# Patient Record
Sex: Female | Born: 1965
Health system: Southern US, Community
[De-identification: ages and names within clinical notes are randomized; demographics above are authoritative.]

## PROBLEM LIST (undated history)

## (undated) DIAGNOSIS — M795 Residual foreign body in soft tissue: Secondary | ICD-10-CM

## (undated) DIAGNOSIS — R42 Dizziness and giddiness: Secondary | ICD-10-CM

## (undated) DIAGNOSIS — Z87891 Personal history of nicotine dependence: Secondary | ICD-10-CM

## (undated) DIAGNOSIS — Z90710 Acquired absence of both cervix and uterus: Secondary | ICD-10-CM

## (undated) DIAGNOSIS — F1911 Other psychoactive substance abuse, in remission: Secondary | ICD-10-CM

## (undated) DIAGNOSIS — M1990 Unspecified osteoarthritis, unspecified site: Secondary | ICD-10-CM

## (undated) DIAGNOSIS — G459 Transient cerebral ischemic attack, unspecified: Secondary | ICD-10-CM

## (undated) DIAGNOSIS — I639 Cerebral infarction, unspecified: Secondary | ICD-10-CM

## (undated) DIAGNOSIS — T7840XA Allergy, unspecified, initial encounter: Secondary | ICD-10-CM

## (undated) DIAGNOSIS — G4730 Sleep apnea, unspecified: Secondary | ICD-10-CM

## (undated) DIAGNOSIS — Z8781 Personal history of (healed) traumatic fracture: Secondary | ICD-10-CM

## (undated) DIAGNOSIS — E119 Type 2 diabetes mellitus without complications: Secondary | ICD-10-CM

## (undated) DIAGNOSIS — Z8619 Personal history of other infectious and parasitic diseases: Secondary | ICD-10-CM

## (undated) DIAGNOSIS — F1920 Other psychoactive substance dependence, uncomplicated: Secondary | ICD-10-CM

## (undated) HISTORY — DX: Other psychoactive substance dependence, uncomplicated: F19.20

## (undated) HISTORY — DX: Personal history of other infectious and parasitic diseases: Z86.19

## (undated) HISTORY — DX: Personal history of nicotine dependence: Z87.891

## (undated) HISTORY — PX: WRIST FRACTURE SURGERY: SHX121

## (undated) HISTORY — DX: Personal history of (healed) traumatic fracture: Z87.81

## (undated) HISTORY — DX: Dizziness and giddiness: R42

## (undated) HISTORY — PX: OTHER SURGICAL HISTORY: SHX169

## (undated) HISTORY — DX: Type 2 diabetes mellitus without complications: E11.9

## (undated) HISTORY — DX: Acquired absence of both cervix and uterus: Z90.710

## (undated) HISTORY — PX: WISDOM TOOTH EXTRACTION: SHX21

## (undated) HISTORY — DX: Other psychoactive substance abuse, in remission: F19.11

## (undated) HISTORY — DX: Unspecified osteoarthritis, unspecified site: M19.90

## (undated) HISTORY — DX: Allergy, unspecified, initial encounter: T78.40XA

## (undated) HISTORY — PX: ABDOMINAL HYSTERECTOMY: SHX81

---

## 1998-04-29 ENCOUNTER — Encounter: Admission: RE | Admit: 1998-04-29 | Discharge: 1998-04-29 | Payer: Self-pay | Admitting: *Deleted

## 2000-01-24 ENCOUNTER — Emergency Department (HOSPITAL_COMMUNITY): Admission: EM | Admit: 2000-01-24 | Discharge: 2000-01-24 | Payer: Self-pay | Admitting: Emergency Medicine

## 2002-12-16 ENCOUNTER — Emergency Department (HOSPITAL_COMMUNITY): Admission: EM | Admit: 2002-12-16 | Discharge: 2002-12-16 | Payer: Self-pay | Admitting: Emergency Medicine

## 2002-12-16 ENCOUNTER — Encounter: Payer: Self-pay | Admitting: Emergency Medicine

## 2004-10-27 ENCOUNTER — Emergency Department (HOSPITAL_COMMUNITY): Admission: EM | Admit: 2004-10-27 | Discharge: 2004-10-27 | Payer: Self-pay | Admitting: Emergency Medicine

## 2005-07-05 ENCOUNTER — Inpatient Hospital Stay (HOSPITAL_COMMUNITY): Admission: AD | Admit: 2005-07-05 | Discharge: 2005-07-06 | Payer: Self-pay | Admitting: Obstetrics and Gynecology

## 2005-10-29 ENCOUNTER — Inpatient Hospital Stay (HOSPITAL_COMMUNITY): Admission: AD | Admit: 2005-10-29 | Discharge: 2005-10-29 | Payer: Self-pay | Admitting: Family Medicine

## 2005-11-15 ENCOUNTER — Ambulatory Visit (HOSPITAL_COMMUNITY): Admission: RE | Admit: 2005-11-15 | Discharge: 2005-11-15 | Payer: Self-pay | Admitting: Obstetrics

## 2005-11-15 ENCOUNTER — Encounter (INDEPENDENT_AMBULATORY_CARE_PROVIDER_SITE_OTHER): Payer: Self-pay | Admitting: Specialist

## 2006-04-07 ENCOUNTER — Inpatient Hospital Stay (HOSPITAL_COMMUNITY): Admission: AD | Admit: 2006-04-07 | Discharge: 2006-04-07 | Payer: Self-pay | Admitting: Obstetrics

## 2006-12-17 LAB — CONVERTED CEMR LAB: Pap Smear: NORMAL

## 2007-10-17 DIAGNOSIS — Z87891 Personal history of nicotine dependence: Secondary | ICD-10-CM

## 2007-10-17 HISTORY — DX: Personal history of nicotine dependence: Z87.891

## 2007-12-05 ENCOUNTER — Emergency Department (HOSPITAL_COMMUNITY): Admission: EM | Admit: 2007-12-05 | Discharge: 2007-12-05 | Payer: Self-pay | Admitting: Emergency Medicine

## 2007-12-10 ENCOUNTER — Telehealth (INDEPENDENT_AMBULATORY_CARE_PROVIDER_SITE_OTHER): Payer: Self-pay | Admitting: Family Medicine

## 2007-12-10 ENCOUNTER — Ambulatory Visit: Payer: Self-pay | Admitting: Family Medicine

## 2007-12-10 DIAGNOSIS — R42 Dizziness and giddiness: Secondary | ICD-10-CM | POA: Insufficient documentation

## 2007-12-10 DIAGNOSIS — F1910 Other psychoactive substance abuse, uncomplicated: Secondary | ICD-10-CM | POA: Insufficient documentation

## 2007-12-10 DIAGNOSIS — F17200 Nicotine dependence, unspecified, uncomplicated: Secondary | ICD-10-CM | POA: Insufficient documentation

## 2007-12-10 DIAGNOSIS — J309 Allergic rhinitis, unspecified: Secondary | ICD-10-CM | POA: Insufficient documentation

## 2008-12-22 ENCOUNTER — Emergency Department (HOSPITAL_COMMUNITY): Admission: EM | Admit: 2008-12-22 | Discharge: 2008-12-22 | Payer: Self-pay | Admitting: Emergency Medicine

## 2010-11-18 ENCOUNTER — Emergency Department (HOSPITAL_COMMUNITY)
Admission: EM | Admit: 2010-11-18 | Discharge: 2010-11-18 | Disposition: A | Discharge status: Home / Self Care | Payer: 59 | Attending: Emergency Medicine | Admitting: Emergency Medicine

## 2010-11-18 DIAGNOSIS — N390 Urinary tract infection, site not specified: Secondary | ICD-10-CM | POA: Insufficient documentation

## 2010-11-18 DIAGNOSIS — R51 Headache: Secondary | ICD-10-CM | POA: Insufficient documentation

## 2010-11-18 DIAGNOSIS — R110 Nausea: Secondary | ICD-10-CM | POA: Insufficient documentation

## 2010-11-18 DIAGNOSIS — R5383 Other fatigue: Secondary | ICD-10-CM | POA: Insufficient documentation

## 2010-11-18 DIAGNOSIS — R42 Dizziness and giddiness: Secondary | ICD-10-CM | POA: Insufficient documentation

## 2010-11-18 DIAGNOSIS — R5381 Other malaise: Secondary | ICD-10-CM | POA: Insufficient documentation

## 2010-11-18 LAB — POCT I-STAT, CHEM 8
BUN: 12 mg/dL (ref 6–23)
Calcium, Ion: 1.16 mmol/L (ref 1.12–1.32)
Chloride: 104 mEq/L (ref 96–112)
Glucose, Bld: 108 mg/dL — ABNORMAL HIGH (ref 70–99)
HCT: 38 % (ref 36.0–46.0)
Hemoglobin: 12.9 g/dL (ref 12.0–15.0)
Potassium: 3.7 mEq/L (ref 3.5–5.1)
Sodium: 140 mEq/L (ref 135–145)
TCO2: 26 mmol/L (ref 0–100)

## 2010-11-18 LAB — DIFFERENTIAL
Basophils Absolute: 0.1 10*3/uL (ref 0.0–0.1)
Lymphs Abs: 5.3 10*3/uL — ABNORMAL HIGH (ref 0.7–4.0)
Monocytes Absolute: 0.8 10*3/uL (ref 0.1–1.0)

## 2010-11-18 LAB — POCT PREGNANCY, URINE: Preg Test, Ur: NEGATIVE

## 2010-11-18 LAB — URINALYSIS, ROUTINE W REFLEX MICROSCOPIC
Bilirubin Urine: NEGATIVE
Ketones, ur: NEGATIVE mg/dL
Leukocytes, UA: NEGATIVE
Protein, ur: NEGATIVE mg/dL
Urine Glucose, Fasting: NEGATIVE mg/dL

## 2010-11-18 LAB — CBC
MCHC: 34 g/dL (ref 30.0–36.0)
RDW: 14.8 % (ref 11.5–15.5)

## 2010-11-20 LAB — URINE CULTURE
Colony Count: NO GROWTH
Culture  Setup Time: 201202040513
Culture: NO GROWTH

## 2010-11-23 ENCOUNTER — Ambulatory Visit: Payer: Self-pay | Admitting: Internal Medicine

## 2011-02-05 ENCOUNTER — Emergency Department (HOSPITAL_COMMUNITY)
Admission: EM | Admit: 2011-02-05 | Discharge: 2011-02-05 | Disposition: A | Discharge status: Home / Self Care | Payer: 59 | Attending: Emergency Medicine | Admitting: Emergency Medicine

## 2011-02-05 DIAGNOSIS — H11419 Vascular abnormalities of conjunctiva, unspecified eye: Secondary | ICD-10-CM | POA: Insufficient documentation

## 2011-02-05 DIAGNOSIS — H109 Unspecified conjunctivitis: Secondary | ICD-10-CM | POA: Insufficient documentation

## 2011-02-05 DIAGNOSIS — R599 Enlarged lymph nodes, unspecified: Secondary | ICD-10-CM | POA: Insufficient documentation

## 2011-02-05 DIAGNOSIS — H9209 Otalgia, unspecified ear: Secondary | ICD-10-CM | POA: Insufficient documentation

## 2011-02-05 DIAGNOSIS — J069 Acute upper respiratory infection, unspecified: Secondary | ICD-10-CM | POA: Insufficient documentation

## 2011-02-05 DIAGNOSIS — J329 Chronic sinusitis, unspecified: Secondary | ICD-10-CM | POA: Insufficient documentation

## 2011-02-05 DIAGNOSIS — R51 Headache: Secondary | ICD-10-CM | POA: Insufficient documentation

## 2011-02-05 DIAGNOSIS — H6690 Otitis media, unspecified, unspecified ear: Secondary | ICD-10-CM | POA: Insufficient documentation

## 2011-02-05 DIAGNOSIS — IMO0002 Reserved for concepts with insufficient information to code with codable children: Secondary | ICD-10-CM | POA: Insufficient documentation

## 2011-02-05 DIAGNOSIS — H5789 Other specified disorders of eye and adnexa: Secondary | ICD-10-CM | POA: Insufficient documentation

## 2011-02-05 DIAGNOSIS — J3489 Other specified disorders of nose and nasal sinuses: Secondary | ICD-10-CM | POA: Insufficient documentation

## 2011-02-05 LAB — POCT PREGNANCY, URINE: Preg Test, Ur: NEGATIVE

## 2011-03-03 NOTE — Op Note (Signed)
NAME:  Monique Nelson, Monique Nelson NO.:  [id number]   MEDICAL RECORD NO.:  [id number]          PATIENT TYPE:  AMB   LOCATION:  SDC                           FACILITY:  [facility]   PHYSICIAN:  Kathreen Cosier, M.D.DATE OF BIRTH:  29-Aug-1966   DATE OF PROCEDURE:  11/15/2005  DATE OF DISCHARGE:                                 OPERATIVE REPORT   PREOPERATIVE DIAGNOSIS:  Dysfunctional uterine bleeding.   PROCEDURE:  Hysteroscopy, dilatation and hysteroscopy, NovaSure ablation.   Under general anesthesia, the uterus was normal size, negative adnexa.  Endocervix was curetted and a small amount of tissue obtained.  Endometrial  cavity sounded 10 cm.  The cervical length was 5 cm, cavity length 5 cm.  The cavity was anterior, the cervix dilated to a #27 Pratt, hysteroscope  inserted, normal cavity seen.  A sharp curettage was then performed, a large  amount of tissue obtained.  The NovaSure device was inserted and the cavity  integrity passed.  Ablation was performed at 105 watts for 1 minute 41  seconds.  Cavity width was 3.8 cm.  Flow deficit 75 mL.  Repeat hysteroscopy  showed total ablation of the endometrial cavity.  The patient tolerated the  procedure well, taken to the recovery room in good condition.           ______________________________  Kathreen Cosier, M.D.     BAM/MEDQ  D:  11/15/2005  T:  11/15/2005  Job:  [id number]

## 2011-07-07 ENCOUNTER — Ambulatory Visit (INDEPENDENT_AMBULATORY_CARE_PROVIDER_SITE_OTHER): Payer: 59 | Admitting: Internal Medicine

## 2011-07-07 ENCOUNTER — Encounter: Payer: Self-pay | Admitting: Internal Medicine

## 2011-07-07 DIAGNOSIS — Z87891 Personal history of nicotine dependence: Secondary | ICD-10-CM

## 2011-07-07 DIAGNOSIS — F1911 Other psychoactive substance abuse, in remission: Secondary | ICD-10-CM

## 2011-07-07 NOTE — Patient Instructions (Signed)
Get [place] a copy of labs  . Consider weight watchers   Addition to you other  Efforts.  Need yearly thyroid tests. 3500 calories is the energy content of a pound of body weight .Must have a 3500 cal deficit to lose one pound . Thus decrease 500 calorie equivalent per day in food or drink intake / or exercise  for 7 days to lose one pound.

## 2011-07-07 NOTE — Progress Notes (Signed)
Subjective:    Patient ID: Monique Nelson) Monique Nelson, female    DOB: 1966-09-05, 45 y.o.   MRN: [id number]  HPI Tia  Comes in today   Establish. Has no current PCP . Sees [facility] office   Yearly otherwise  Concerns about having a difficult time;a hard time losing weight.  Has always been some heavy but not a string family hx of such .   BMi screening at work  With  screen lipids and bg. .  In June.   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  NOT doing organized program has done many in the past but no weight watchers.  Review of Systems Snore   No osa sx   Allergies seasonal  Uses otc.  No sig sweet drinks .  ROS:  GEN/ HEENTNo fever, signific sweats headaches vision problems hearing changes, CV/ PULM; No chest pain shortness of breath cough, syncope,edema  change in exercise tolerance. GI /GU: No adominal pain, vomiting, change in bowel habits. No blood in the stool. No significant GU symptoms. SKIN/HEME: ,no acute skin rashes suspicious lesions or bleeding. No lymphadenopathy, nodules, masses.  NEURO/ PSYCH:  No neurologic signs such as weakness numbness No depression anxiety. IMM/ Allergy: No unusual infections.  Allergy . No   REST of 12 system review negative as per hpi   Past Medical History  Diagnosis Date  . Vertigo     intermittent infrequent  . Drug addiction     recovering  . Hx of varicella   . Substance abuse in remission 13 years ago     cocaine heroiin  met   support via ADP  . Ex-smoker 2009  . History of fracture of wrist 17 years ago    has plate and pins right    Past Surgical History  Procedure Date  . Rt hand surgery     17 years ago  . Ablastion     reports that she has quit smoking. She does not have any smokeless tobacco history on file. She reports that she drinks alcohol. She reports that she uses illicit drugs ("Crack" cocaine, Heroin, Methamphetamines, and Marijuana). family history includes  Alcohol abuse in an unspecified family member; COPD in her mother; Chronic fatigue in her mother; Diabetes in her mother; Glaucoma in her father; Hypertension in her mother; Osteoarthritis in her mother; Other in her father; Sleep apnea in her mother; and Thyroid disease in her mother. No Known Allergies      Objective:   Physical Exam Physical Exam: Vital signs reviewed WUJ:WJXB is a well-developed well-nourished alert cooperative  AA female who appears her stated age in no acute distress. She is well appearing HEENT: normocephalic  traumatic , Eyes: PERRL EOM's full, conjunctiva clear, Nares: paten,t no deformity discharge or tenderness., Ears: no deformity EAC's clear TMs with normal landmarks. Mouth: clear OP, no lesions, edema.  Moist mucous membranes. Dentition in adequate repair. NECK: supple without masses, thyromegaly or bruits. CHEST/PULM:  Clear to auscultation breath sounds equal no wheeze , rales or rhonchi.. CV: PMI is nondisplaced, S1 S2 no gallops, murmurs, rubs. Peripheral pulses are full without delay.No JVD .  ABDOMEN: Bowel sounds normal nontender  No guard or rebound, no hepato splenomegal .  Extremtities:  No clubbing cyanosis or edema, no acute joint swelling or redness  NEURO:  Oriented x3, cranial nerves 3-12 appear to be intact, no obvious focal weakness,gait within normal limits  SKIN: No acute rashes normal turgor, color, no observed  bruising or petechiae. PSYCH: Oriented, good eye contact, no obvious depression anxiety, cognition and judgment appear normal.   Reviewed hx sheet  Data input per poivider    Assessment & Plan:   OBESITY  Elevated BMI Agree with taking measures to correct this is already on to a good start . Counseled. At length about  Caloric reduction and pitfalls s  .    Rec some type on life style change support  Such as weight watchers . Meds on the market not very good for  loing term success.   Get [place] copy of her labs also.  Hx of  substance abuse in the past  and   tobacco  In remission. congratulated on  Substance free    Total visit   30 mins > 50% spent counseling and coordinating care

## 2011-07-09 ENCOUNTER — Encounter: Payer: Self-pay | Admitting: Internal Medicine

## 2011-07-09 DIAGNOSIS — Z87891 Personal history of nicotine dependence: Secondary | ICD-10-CM | POA: Insufficient documentation

## 2011-07-09 DIAGNOSIS — F1911 Other psychoactive substance abuse, in remission: Secondary | ICD-10-CM | POA: Insufficient documentation

## 2011-07-10 LAB — URINALYSIS, ROUTINE W REFLEX MICROSCOPIC
Bilirubin Urine: NEGATIVE
Hgb urine dipstick: NEGATIVE
Ketones, ur: NEGATIVE
Nitrite: NEGATIVE
Protein, ur: NEGATIVE
Specific Gravity, Urine: <1.005 — ABNORMAL LOW
Urobilinogen, UA: 0.2

## 2011-07-10 LAB — CBC
MCHC: 34.4
MCV: 86.1
Platelets: 400
RDW: 15.1
WBC: 9.7

## 2011-07-10 LAB — RAPID URINE DRUG SCREEN, HOSP PERFORMED
Amphetamines: NOT DETECTED
Tetrahydrocannabinol: NOT DETECTED

## 2011-07-10 LAB — BASIC METABOLIC PANEL
BUN: 15
CO2: 27
Calcium: 9
Chloride: 104
Creatinine, Ser: 0.75
Glucose, Bld: 109 — ABNORMAL HIGH

## 2011-07-10 LAB — PREGNANCY, URINE: Preg Test, Ur: NEGATIVE

## 2011-10-31 ENCOUNTER — Encounter: Payer: Self-pay | Admitting: Family

## 2011-10-31 ENCOUNTER — Ambulatory Visit (INDEPENDENT_AMBULATORY_CARE_PROVIDER_SITE_OTHER): Payer: 59 | Admitting: Family

## 2011-10-31 VITALS — BP 118/62 | Temp 98.3°F | Wt 242.0 lb

## 2011-10-31 DIAGNOSIS — R42 Dizziness and giddiness: Secondary | ICD-10-CM

## 2011-10-31 MED ORDER — MECLIZINE HCL 25 MG PO TABS: 25.0000 mg | ORAL_TABLET | Freq: Three times a day (TID) | ORAL | Status: DC | PRN

## 2011-10-31 NOTE — Patient Instructions (Signed)
Vertigo Vertigo means you feel like you or your surroundings are moving when they are not. Vertigo can be dangerous if it occurs when you are at work, driving, or performing difficult activities.  CAUSES  Vertigo occurs when there is a conflict of signals sent to your brain from the visual and sensory systems in your body. There are many different causes of vertigo, including:  Infections, especially in the inner ear.   A bad reaction to a drug or misuse of alcohol and medicines.   Withdrawal from drugs or alcohol.   Rapidly changing positions, such as lying down or rolling over in bed.   A migraine headache.   Decreased blood flow to the brain.   Increased pressure in the brain from a head injury, infection, tumor, or bleeding.  SYMPTOMS  You may feel as though the world is spinning around or you are falling to the ground. Because your balance is upset, vertigo can cause nausea and vomiting. You may have involuntary eye movements (nystagmus). DIAGNOSIS  Vertigo is usually diagnosed by physical exam. If the cause of your vertigo is unknown, your caregiver may perform imaging tests, such as an MRI scan (magnetic resonance imaging). TREATMENT  Most cases of vertigo resolve on their own, without treatment. Depending on the cause, your caregiver may prescribe certain medicines. If your vertigo is related to body position issues, your caregiver may recommend movements or procedures to correct the problem. In rare cases, if your vertigo is caused by certain inner ear problems, you may need surgery. HOME CARE INSTRUCTIONS   Follow your caregiver's instructions.   Avoid driving.   Avoid operating heavy machinery.   Avoid performing any tasks that would be dangerous to you or others during a vertigo episode.   Tell your caregiver if you notice that certain medicines seem to be causing your vertigo. Some of the medicines used to treat vertigo episodes can actually make them worse in some  people.  SEEK IMMEDIATE MEDICAL CARE IF:   Your medicines do not relieve your vertigo or are making it worse.   You develop problems with talking, walking, weakness, or using your arms, hands, or legs.   You develop severe headaches.   Your nausea or vomiting continues or gets worse.   You develop visual changes.   A family member notices behavioral changes.   Your condition gets worse.  MAKE SURE YOU:  Understand these instructions.   Will watch your condition.   Will get help right away if you are not doing well or get worse.  Document Released: 07/12/2005 Document Revised: 06/14/2011 Document Reviewed: 04/20/2011 [facility] Patient Information 2012 [place], [place].  Exercise to Lose Weight Exercise and a healthy diet may help you lose weight. Your doctor may suggest specific exercises. EXERCISE IDEAS AND TIPS  Choose low-cost things you enjoy doing, such as walking, bicycling, or exercising to workout videos.   Take stairs instead of the elevator.   Walk during your lunch break.   Park your car further away from work or school.   Go to a gym or an exercise class.   Start with 5 to 10 minutes of exercise each day. Build up to 30 minutes of exercise 4 to 6 days a week.   Wear shoes with good support and comfortable clothes.   Stretch before and after working out.   Work out until you breathe harder and your heart beats faster.   Drink extra water when you exercise.   Do not do  so much that you hurt yourself, feel dizzy, or get very short of breath.  Exercises that burn about 150 calories:  Running 1  miles in 15 minutes.   Playing volleyball for 45 to 60 minutes.   Washing and waxing a car for 45 to 60 minutes.   Playing touch football for 45 minutes.   Walking 1  miles in 35 minutes.   Pushing a stroller 1  miles in 30 minutes.   Playing basketball for 30 minutes.   Raking leaves for 30 minutes.   Bicycling 5 miles in 30 minutes.   Walking 2  miles in 30 minutes.   Dancing for 30 minutes.   Shoveling snow for 15 minutes.   Swimming laps for 20 minutes.   Walking up stairs for 15 minutes.   Bicycling 4 miles in 15 minutes.   Gardening for 30 to 45 minutes.   Jumping rope for 15 minutes.   Washing windows or floors for 45 to 60 minutes.  Document Released: 11/04/2010 Document Revised: 06/14/2011 Document Reviewed: 11/04/2010 [facility] Patient Information 2012 [place], [place].

## 2011-10-31 NOTE — Progress Notes (Signed)
Subjective:    Patient ID: Daniel Nones Crist Infante) Monique Nelson, female    DOB: 1966/04/06, 46 y.o.   MRN: [id number]  HPI 46 year old [place] American female, nonsmoker, patient of Dr. Fabian Sharp is in today with complaints of vertigo. She has a history of vertigo. She feels like the room is spinning, it is accompanied by nausea. She's had no vomiting. Denies any upper respiratory symptoms, no sneezing, cough, congestion. She denies any chest pain, shortness of breath, or edema.  Patient also has concerns of being morbidly obese. She currently exercises 4 times a week including cardiovascular exercise as well as weight training. She has tried numerous diets to include Weight Watchers. She has been unable to lose weight. She is inquiring about Adipex. Denies any history of any cardiovascular illnesses. No family history of any sudden death or cardiovascular disease.   Review of Systems  Constitutional: Negative.   HENT: Negative.   Eyes: Negative.   Respiratory: Negative.   Cardiovascular: Negative.   Gastrointestinal: Negative.   Genitourinary: Negative.   Musculoskeletal: Negative.   Skin: Negative.   Neurological: Positive for dizziness.  Hematological: Negative.   Psychiatric/Behavioral: Negative.    Past Medical History  Diagnosis Date  . Vertigo     intermittent infrequent  . Drug addiction     recovering  . Hx of varicella   . Substance abuse in remission 13 years ago     cocaine heroiin  met   support via ADP  . Ex-smoker 2009  . History of fracture of wrist 17 years ago    has plate and pins right     History   Social History  . Marital Status: Single    Spouse Name: N/A    Number of Children: N/A  . Years of Education: N/A   Occupational History  . Not on file.   Social History Main Topics  . Smoking status: Former Games developer  . Smokeless tobacco: Not on file  . Alcohol Use: Yes     socially  . Drug Use: Yes    Special: "Crack" cocaine, Heroin, Methamphetamines, Marijuana       History- 13 years clean  . Sexually Active: Not on file   Other Topics Concern  . 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 Rt hand surgery     17 years ago  . Ablastion     Family History  Problem Relation Age of Onset  . COPD Mother   . Diabetes Mother   . Hypertension Mother   . Sleep apnea Mother   . Thyroid disease Mother   . Chronic fatigue Mother   . Glaucoma Father   . Other Father     prostate disease  . Osteoarthritis Mother     knee replacement   . Alcohol abuse      drug parent    No Known Allergies  No current outpatient prescriptions on file prior to visit.    BP 118/62  Temp(Src) 98.3 F (36.8 C) (Oral)  Wt 242 lb (109.77 kg)chart    Objective:   Physical Exam  Constitutional: She is oriented to person, place, and time. She appears well-developed and well-nourished.       Morbidly obese  HENT:  Right  Ear: External ear normal.  Left Ear: External ear normal.  Nose: Nose normal.  Mouth/Throat: Oropharynx is clear and moist.  Eyes: Conjunctivae are normal. Pupils are equal, round, and reactive to light.  Neck: Normal range of motion. Neck supple. No JVD present. No thyromegaly present.  Cardiovascular: Normal rate, regular rhythm and normal heart sounds.  Exam reveals no gallop and no friction rub.   No murmur heard. Pulmonary/Chest: Effort normal and breath sounds normal. No stridor.  Abdominal: Soft. Bowel sounds are normal.  Musculoskeletal: Normal range of motion.  Neurological: She is alert and oriented to person, place, and time.  Skin: Skin is warm and dry.  Psychiatric: She has a normal mood and affect.          Assessment & Plan:  Assessment: Vertigo, morbidly obese  Plan: Since EKG was normal  we'll send labs to include BMP, TSH, and CBC. We'll consider Adipex pending the results of a healthy lab panel. Patient is aware that she will have monthly office visits. Antivert 25 mg one tablet every 8 hours when necessary vertigo. Call the office is symptoms worsen or persist, recheck a schedule, and when necessary.

## 2011-11-02 ENCOUNTER — Telehealth: Payer: Self-pay | Admitting: Family

## 2011-11-02 LAB — BASIC METABOLIC PANEL
BUN: 16 mg/dL (ref 6–23)
Chloride: 103 mEq/L (ref 96–112)
GFR: 80.5 mL/min (ref >60.00)
Potassium: 4.4 mEq/L (ref 3.5–5.1)

## 2011-11-02 LAB — TSH: TSH: 0.95 u[IU]/mL (ref 0.35–5.50)

## 2011-11-02 NOTE — Telephone Encounter (Signed)
Returned pt's call.  Per Padonda, TSH is normal. Awaiting BMP results.   Pt aware and will await pending labs to see if she can get weight loss medication

## 2011-11-02 NOTE — Telephone Encounter (Signed)
Pt called and is req to be called today before 3pm to get lab results.

## 2011-11-02 NOTE — Telephone Encounter (Signed)
Pt would like blood work results °

## 2011-11-03 ENCOUNTER — Telehealth: Payer: Self-pay | Admitting: Family

## 2011-11-03 ENCOUNTER — Other Ambulatory Visit: Payer: Self-pay | Admitting: Family

## 2011-11-03 MED ORDER — PHENTERMINE HCL 37.5 MG PO TABS: 37.5000 mg | ORAL_TABLET | Freq: Every day | ORAL | Status: DC

## 2011-11-03 NOTE — Telephone Encounter (Signed)
Says that med request has not been received at [facility] yet

## 2011-11-03 NOTE — Telephone Encounter (Signed)
Rx phoned into pharmacy.

## 2012-02-07 ENCOUNTER — Other Ambulatory Visit: Payer: Self-pay | Admitting: Obstetrics and Gynecology

## 2012-02-15 ENCOUNTER — Telehealth: Payer: Self-pay

## 2012-02-15 NOTE — Telephone Encounter (Signed)
Attempted to leave pt a message but voicemail states pt can receive voicemails at this time.

## 2012-02-15 NOTE — Telephone Encounter (Signed)
I dont see this in the EHR  Note.   Usually done  at age 46 or if having blood in stool or if has a first degree relative that had colon cancer  Before age 23 .  If she has any of these situation  Then can refer for this  Or speak with Gi doc

## 2012-02-15 NOTE — Telephone Encounter (Signed)
Pt states at her last visit with Dr. Fabian Sharp she was told she needed to begin getting colonoscopies done.  Pls advise.

## 2012-02-19 NOTE — Telephone Encounter (Signed)
Attempted to call patient at both home and work number; neither available.

## 2012-07-01 ENCOUNTER — Other Ambulatory Visit (INDEPENDENT_AMBULATORY_CARE_PROVIDER_SITE_OTHER): Payer: 59

## 2012-07-01 DIAGNOSIS — Z0000 Encounter for general adult medical examination without abnormal findings: Secondary | ICD-10-CM

## 2012-07-01 LAB — BASIC METABOLIC PANEL
CO2: 28 mEq/L (ref 19–32)
Calcium: 9.2 mg/dL (ref 8.4–10.5)
GFR: 130.51 mL/min (ref >60.00)
Glucose, Bld: 101 mg/dL — ABNORMAL HIGH (ref 70–99)
Potassium: 4.1 mEq/L (ref 3.5–5.1)
Sodium: 139 mEq/L (ref 135–145)

## 2012-07-01 LAB — HEPATIC FUNCTION PANEL
Bilirubin, Direct: 0.1 mg/dL (ref 0.0–0.3)
Total Bilirubin: 0.4 mg/dL (ref 0.3–1.2)

## 2012-07-01 LAB — POCT URINALYSIS DIPSTICK
Bilirubin, UA: NEGATIVE
Glucose, UA: NEGATIVE
Ketones, UA: NEGATIVE
Leukocytes, UA: NEGATIVE
Nitrite, UA: NEGATIVE
pH, UA: 5.5

## 2012-07-01 LAB — CBC WITH DIFFERENTIAL/PLATELET
Basophils Relative: 0.4 % (ref 0.0–3.0)
HCT: 37.4 % (ref 36.0–46.0)
Hemoglobin: 12 g/dL (ref 12.0–15.0)
Lymphocytes Relative: 42.6 % (ref 12.0–46.0)
Lymphs Abs: 3.6 10*3/uL (ref 0.7–4.0)
Monocytes Relative: 5 % (ref 3.0–12.0)
Neutro Abs: 4.3 10*3/uL (ref 1.4–7.7)
RBC: 4.3 Mil/uL (ref 3.87–5.11)
RDW: 16.2 % — ABNORMAL HIGH (ref 11.5–14.6)

## 2012-07-01 LAB — LIPID PANEL
HDL: 40.6 mg/dL (ref >39.00)
LDL Cholesterol: 129 mg/dL — ABNORMAL HIGH (ref 0–99)
Total CHOL/HDL Ratio: 5
VLDL: 18.8 mg/dL (ref 0.0–40.0)

## 2012-07-01 LAB — TSH: TSH: 1.31 u[IU]/mL (ref 0.35–5.50)

## 2012-07-10 ENCOUNTER — Encounter: Payer: 59 | Admitting: Internal Medicine

## 2012-07-10 ENCOUNTER — Telehealth: Payer: Self-pay | Admitting: Internal Medicine

## 2012-07-10 NOTE — Telephone Encounter (Signed)
Pt had to cancel cpx today due to unable to leave work. Pt would like cpx before end of yr. Can I create 30 min slot?

## 2012-07-10 NOTE — Telephone Encounter (Signed)
Pt is sch 08-02-2012

## 2012-07-10 NOTE — Telephone Encounter (Signed)
Yes, find a spot and put her in per me.  Thanks!!

## 2012-08-02 ENCOUNTER — Encounter: Payer: Self-pay | Admitting: Internal Medicine

## 2012-08-02 ENCOUNTER — Ambulatory Visit (INDEPENDENT_AMBULATORY_CARE_PROVIDER_SITE_OTHER): Payer: 59 | Admitting: Internal Medicine

## 2012-08-02 VITALS — BP 112/72 | HR 96 | Temp 98.4°F | Ht 61.5 in | Wt 236.0 lb

## 2012-08-02 DIAGNOSIS — Z90710 Acquired absence of both cervix and uterus: Secondary | ICD-10-CM

## 2012-08-02 DIAGNOSIS — Z0000 Encounter for general adult medical examination without abnormal findings: Secondary | ICD-10-CM

## 2012-08-02 DIAGNOSIS — N951 Menopausal and female climacteric states: Secondary | ICD-10-CM | POA: Insufficient documentation

## 2012-08-02 NOTE — Progress Notes (Signed)
Subjective:    Patient ID: Monique Nelson) Therisa Doyne, female    DOB: 1966/02/15, 46 y.o.   MRN: [id number]  HPI Patient comes in today for preventive visit and follow-up of medical issues. Update  history since  last visit: She has had a hysterectomy for her bleeding fibroids and cysts and she feels very much better less fatigue. She is taken upon herself she healthy and be active and lose weight she has joined [employer]. She is or he lost at least 10 pounds. Since her surgery she has had some hot flashes that feel menopausal and her sleep has been disturbed at night this is a new thing. Denies depression anxiety or panic she is working 2 jobs.  No history of sleep apnea. Flu shot at job.  Past history family history social history reviewed in the electronic medical record.  Review of Systems ROS:  GEN/ HEENT: No fever, significant weight changes sweats headaches vision problems hearing changes, CV/ PULM; No chest pain shortness of breath cough, syncope,edema  change in exercise tolerance. GI /GU: No adominal pain, vomiting, change in bowel habits. No blood in the stool. No significant GU symptoms. SKIN/HEME: ,no acute skin rashes suspicious lesions or bleeding. No lymphadenopathy, nodules, masses.  NEURO/ PSYCH:  No neurologic signs such as weakness numbness. No depression anxiety. IMM/ Allergy: No unusual infections.  Allergy .   REST of 12 system review negative except as per [facility] meds      Objective:   Physical Exam BP 112/72  Pulse 96  Temp 98.4 F (36.9 C) (Oral)  Ht 5' 1.5" (1.562 m)  Wt 236 lb (107.049 kg)  BMI 43.87 kg/m2  SpO2 97%  LMP 12/15/2011  Physical Exam: Vital signs reviewed WUJ:WJXB is a well-developed well-nourished alert cooperative aafemale who appears her stated age in no acute distress.  HEENT: normocephalic atraumatic , Eyes: PERRL EOM's full, conjunctiva clear, Nares: paten,t no deformity discharge or tenderness., Ears: no deformity EAC's clear TMs  with normal landmarks. Mouth: clear OP, no lesions, edema.  Moist mucous membranes. Dentition in adequate repair. NECK: supple without masses, thyromegaly or bruits. CHEST/PULM:  Clear to auscultation and percussion breath sounds equal no wheeze , rales or rhonchi. No chest wall deformities or tenderness. Breast: normal by inspection . No dimpling, discharge, masses, tenderness or discharge .pendulous  CV: PMI is nondisplaced, S1 S2 no gallops, murmurs, rubs. Peripheral pulses are full without delay.No JVD .  ABDOMEN: Bowel sounds normal nontender  No guard or rebound, no hepato splenomegal no CVA tenderness.  No hernia. Extremtities:  No clubbing cyanosis or edema, no acute joint swelling NEURO:  Oriented x3, cranial nerves 3-12 appear to be intact, no obvious focal weakness,gait within normal limits no abnormal reflexes or asymmetrical SKIN: No acute rashes normal turgor, color, no bruising or petechiae. Som e darkening back of neck sucha s early acanthosis PSYCH: Oriented, good eye contact, no obvious depression anxiety, cognition and judgment appear normal. LN: no cervical axillary inguinal adenopathy   Lab Results  Component Value Date   WBC 8.6 07/01/2012   HGB 12.0 07/01/2012   HCT 37.4 07/01/2012   PLT 372.0 07/01/2012   GLUCOSE 101* 07/01/2012   CHOL 188 07/01/2012   TRIG 94.0 07/01/2012   HDL 40.60 07/01/2012   LDLCALC 129* 07/01/2012   ALT 19 07/01/2012   AST 19 07/01/2012   NA 139 07/01/2012   K 4.1 07/01/2012   CL 105 07/01/2012   CREATININE 0.6 07/01/2012   BUN 15  07/01/2012   CO2 28 07/01/2012   TSH 1.31 07/01/2012      Assessment & Plan:  Preventive Health Care Counseled regarding healthy nutrition, exercise, sleep, injury prevention, calcium vit d and healthy weight . Borderline blood sugar LDL could be better. She's doing well and motivated to continue with her Weight Watchers and lose weight she states that her son has lost a good deal of weight and using the phone now in  addition. Has a very good attitude about this  Hot flashes and sleep disturbance could be problematic probably from her hysterectomy discussed options suggestions would have first discuss this with her OB/GYN at this point. Get [place] a copy of the surgical note as I cannot find it in the electronic health record even though it was done in [place]. Plan recheck wellness visit in one year. Health preventive measures discussed.

## 2012-08-02 NOTE — Patient Instructions (Addendum)
Continue healthy lifestyle intervention [employer].   You are doing well the cholesterol and the blood sugar should improve with healthy weight loss and activity.  Would have you check with gynecology about the hot flushes because you have a hysterectomy. Surgical menopause can make  symptoms a little bit worse and could affect her sleep. It could get better on its own but discuss it with her gynecologist   Preventive Care for Adults, Female A healthy lifestyle and preventive care can promote health and wellness. Preventive health guidelines for women include the following key practices.  A routine yearly physical is a good way to check with your caregiver about your health and preventive screening. It is a chance to share any concerns and updates on your health, and to receive a thorough exam.  Visit your dentist for a routine exam and preventive care every 6 months. Brush your teeth twice a day and floss once a day. Good oral hygiene prevents tooth decay and gum disease.  The frequency of eye exams is based on your age, health, family medical history, use of contact lenses, and other factors. Follow your caregiver's recommendations for frequency of eye exams.  Eat a healthy diet. Foods like vegetables, fruits, whole grains, low-fat dairy products, and lean protein foods contain the nutrients you need without too many calories. Decrease your intake of foods high in solid fats, added sugars, and salt. Eat the right amount of calories for you.Get information about a proper diet from your caregiver, if necessary.  Regular physical exercise is one of the most important things you can do for your health. Most adults should get at least 150 minutes of moderate-intensity exercise (any activity that increases your heart rate and causes you to sweat) each week. In addition, most adults need muscle-strengthening exercises on 2 or more days a week.  Maintain a healthy weight. The body mass index (BMI)  is a screening tool to identify possible weight problems. It provides an estimate of body fat based on height and weight. Your caregiver can help determine your BMI, and can help you achieve or maintain a healthy weight.For adults 20 years and older:  A BMI below 18.5 is considered underweight.  A BMI of 18.5 to 24.9 is normal.  A BMI of 25 to 29.9 is considered overweight.  A BMI of 30 and above is considered obese.  Maintain normal blood lipids and cholesterol levels by exercising and minimizing your intake of saturated fat. Eat a balanced diet with plenty of fruit and vegetables. Blood tests for lipids and cholesterol should begin at age 20 and be repeated every 5 years. If your lipid or cholesterol levels are high, you are over 50, or you are at high risk for heart disease, you may need your cholesterol levels checked more frequently.Ongoing high lipid and cholesterol levels should be treated with medicines if diet and exercise are not effective.  If you smoke, find out from your caregiver how to quit. If you do not use tobacco, do not start.  If you are pregnant, do not drink alcohol. If you are breastfeeding, be very cautious about drinking alcohol. If you are not pregnant and choose to drink alcohol, do not exceed 1 drink per day. One drink is considered to be 12 ounces (355 mL) of beer, 5 ounces (148 mL) of wine, or 1.5 ounces (44 mL) of liquor.  Avoid use of street drugs. Do not share needles with anyone. Ask for help if you need support or  instructions about stopping the use of drugs.  High blood pressure causes heart disease and increases the risk of stroke. Your blood pressure should be checked at least every 1 to 2 years. Ongoing high blood pressure should be treated with medicines if weight loss and exercise are not effective.  If you are 60 to 46 years old, ask your caregiver if you should take aspirin to prevent strokes.  Diabetes screening involves taking a blood sample to  check your fasting blood sugar level. This should be done once every 3 years, after age 76, if you are within normal weight and without risk factors for diabetes. Testing should be considered at a younger age or be carried out more frequently if you are overweight and have at least 1 risk factor for diabetes.  Breast cancer screening is essential preventive care for women. You should practice "breast self-awareness." This means understanding the normal appearance and feel of your breasts and may include breast self-examination. Any changes detected, no matter how small, should be reported to a caregiver. Women in their 43s and 30s should have a clinical breast exam (CBE) by a caregiver as part of a regular health exam every 1 to 3 years. After age 25, women should have a CBE every year. Starting at age 3, women should consider having a mammography (breast X-ray test) every year. Women who have a family history of breast cancer should talk to their caregiver about genetic screening. Women at a high risk of breast cancer should talk to their caregivers about having magnetic resonance imaging (MRI) and a mammography every year.  The Pap test is a screening test for cervical cancer. A Pap test can show cell changes on the cervix that might become cervical cancer if left untreated. A Pap test is a procedure in which cells are obtained and examined from the lower end of the uterus (cervix).  Women should have a Pap test starting at age 4.  Between ages 69 and 98, Pap tests should be repeated every 2 years.  Beginning at age 63, you should have a Pap test every 3 years as long as the past 3 Pap tests have been normal.  Some women have medical problems that increase the chance of getting cervical cancer. Talk to your caregiver about these problems. It is especially important to talk to your caregiver if a new problem develops soon after your last Pap test. In these cases, your caregiver may recommend more  frequent screening and Pap tests.  The above recommendations are the same for women who have or have not gotten the vaccine for human papillomavirus (HPV).  If you had a hysterectomy for a problem that was not cancer or a condition that could lead to cancer, then you no longer need Pap tests. Even if you no longer need a Pap test, a regular exam is a good idea to make sure no other problems are starting.  If you are between ages 34 and 43, and you have had normal Pap tests going back 10 years, you no longer need Pap tests. Even if you no longer need a Pap test, a regular exam is a good idea to make sure no other problems are starting.  If you have had past treatment for cervical cancer or a condition that could lead to cancer, you need Pap tests and screening for cancer for at least 20 years after your treatment.  If Pap tests have been discontinued, risk factors (such as a new sexual  partner) need to be reassessed to determine if screening should be resumed.  The HPV test is an additional test that may be used for cervical cancer screening. The HPV test looks for the virus that can cause the cell changes on the cervix. The cells collected during the Pap test can be tested for HPV. The HPV test could be used to screen women aged 72 years and older, and should be used in women of any age who have unclear Pap test results. After the age of 53, women should have HPV testing at the same frequency as a Pap test.  Colorectal cancer can be detected and often prevented. Most routine colorectal cancer screening begins at the age of 29 and continues through age 83. However, your caregiver may recommend screening at an earlier age if you have risk factors for colon cancer. On a yearly basis, your caregiver may provide home test kits to check for hidden blood in the stool. Use of a small camera at the end of a tube, to directly examine the colon (sigmoidoscopy or colonoscopy), can detect the earliest forms of  colorectal cancer. Talk to your caregiver about this at age 94, when routine screening begins. Direct examination of the colon should be repeated every 5 to 10 years through age 55, unless early forms of pre-cancerous polyps or small growths are found.  Hepatitis C blood testing is recommended for all people born from [birth year] through 1965 and any individual with known risks for hepatitis C.  Practice safe sex. Use condoms and avoid high-risk sexual practices to reduce the spread of sexually transmitted infections (STIs). STIs include gonorrhea, chlamydia, syphilis, trichomonas, herpes, HPV, and human immunodeficiency virus (HIV). Herpes, HIV, and HPV are viral illnesses that have no cure. They can result in disability, cancer, and death. Sexually active women aged 71 and younger should be checked for chlamydia. Older women with new or multiple partners should also be tested for chlamydia. Testing for other STIs is recommended if you are sexually active and at increased risk.  Osteoporosis is a disease in which the bones lose minerals and strength with aging. This can result in serious bone fractures. The risk of osteoporosis can be identified using a bone density scan. Women ages 66 and over and women at risk for fractures or osteoporosis should discuss screening with their caregivers. Ask your caregiver whether you should take a calcium supplement or vitamin D to reduce the rate of osteoporosis.  Menopause can be associated with physical symptoms and risks. Hormone replacement therapy is available to decrease symptoms and risks. You should talk to your caregiver about whether hormone replacement therapy is right for you.  Use sunscreen with sun protection factor (SPF) of 30 or more. Apply sunscreen liberally and repeatedly throughout the day. You should seek shade when your shadow is shorter than you. Protect yourself by wearing long sleeves, pants, a wide-brimmed hat, and sunglasses year round, whenever  you are outdoors.  Once a month, do a whole body skin exam, using a mirror to look at the skin on your back. Notify your caregiver of new moles, moles that have irregular borders, moles that are larger than a pencil eraser, or moles that have changed in shape or color.  Stay current with required immunizations.  Influenza. You need a dose every fall (or winter). The composition of the flu vaccine changes each year, so being vaccinated once is not enough.  Pneumococcal polysaccharide. You need 1 to 2 doses if you smoke cigarettes  or if you have certain chronic medical conditions. You need 1 dose at age 42 (or older) if you have never been vaccinated.  Tetanus, diphtheria, pertussis (Tdap, Td). Get 1 dose of Tdap vaccine if you are younger than age 38, are over 78 and have contact with an infant, are a Research scientist (physical sciences), are pregnant, or simply want to be protected from whooping cough. After that, you need a Td booster dose every 10 years. Consult your caregiver if you have not had at least 3 tetanus and diphtheria-containing shots sometime in your life or have a deep or dirty wound.  HPV. You need this vaccine if you are a woman age 33 or younger. The vaccine is given in 3 doses over 6 months.  Measles, mumps, rubella (MMR). You need at least 1 dose of MMR if you were born in 1957 or later. You may also need a second dose.  Meningococcal. If you are age 59 to 37 and a first-year college student living in a residence hall, or have one of several medical conditions, you need to get vaccinated against meningococcal disease. You may also need additional booster doses.  Zoster (shingles). If you are age 28 or older, you should get this vaccine.  Varicella (chickenpox). If you have never had chickenpox or you were vaccinated but received only 1 dose, talk to your caregiver to find out if you need this vaccine.  Hepatitis A. You need this vaccine if you have a specific risk factor for hepatitis A virus  infection or you simply wish to be protected from this disease. The vaccine is usually given as 2 doses, 6 to 18 months apart.  Hepatitis B. You need this vaccine if you have a specific risk factor for hepatitis B virus infection or you simply wish to be protected from this disease. The vaccine is given in 3 doses, usually over 6 months. Preventive Services / Frequency Ages 55 to 48  Blood pressure check.** / Every 1 to 2 years.  Lipid and cholesterol check.** / Every 5 years beginning at age 53.  Clinical breast exam.** / Every 3 years for women in their 47s and 30s.  Pap test.** / Every 2 years from ages 44 through 83. Every 3 years starting at age 30 through age 31 or 50 with a history of 3 consecutive normal Pap tests.  HPV screening.** / Every 3 years from ages 8 through ages 48 to 32 with a history of 3 consecutive normal Pap tests.  Hepatitis C blood test.** / For any individual with known risks for hepatitis C.  Skin self-exam. / Monthly.  Influenza immunization.** / Every year.  Pneumococcal polysaccharide immunization.** / 1 to 2 doses if you smoke cigarettes or if you have certain chronic medical conditions.  Tetanus, diphtheria, pertussis (Tdap, Td) immunization. / A one-time dose of Tdap vaccine. After that, you need a Td booster dose every 10 years.  HPV immunization. / 3 doses over 6 months, if you are 32 and younger.  Measles, mumps, rubella (MMR) immunization. / You need at least 1 dose of MMR if you were born in 1957 or later. You may also need a second dose.  Meningococcal immunization. / 1 dose if you are age 59 to 83 and a first-year college student living in a residence hall, or have one of several medical conditions, you need to get vaccinated against meningococcal disease. You may also need additional booster doses.  Varicella immunization.** / Consult your caregiver.  Hepatitis A  immunization.** / Consult your caregiver. 2 doses, 6 to 18 months  apart.  Hepatitis B immunization.** / Consult your caregiver. 3 doses usually over 6 months. Ages 31 to 61  Blood pressure check.** / Every 1 to 2 years.  Lipid and cholesterol check.** / Every 5 years beginning at age 61.  Clinical breast exam.** / Every year after age 27.  Mammogram.** / Every year beginning at age 98 and continuing for as long as you are in good health. Consult with your caregiver.  Pap test.** / Every 3 years starting at age 71 through age 78 or 72 with a history of 3 consecutive normal Pap tests.  HPV screening.** / Every 3 years from ages 9 through ages 18 to 48 with a history of 3 consecutive normal Pap tests.  Fecal occult blood test (FOBT) of stool. / Every year beginning at age 52 and continuing until age 70. You may not need to do this test if you get a colonoscopy every 10 years.  Flexible sigmoidoscopy or colonoscopy.** / Every 5 years for a flexible sigmoidoscopy or every 10 years for a colonoscopy beginning at age 62 and continuing until age 37.  Hepatitis C blood test.** / For all people born from [birth year] through 1965 and any individual with known risks for hepatitis C.  Skin self-exam. / Monthly.  Influenza immunization.** / Every year.  Pneumococcal polysaccharide immunization.** / 1 to 2 doses if you smoke cigarettes or if you have certain chronic medical conditions.  Tetanus, diphtheria, pertussis (Tdap, Td) immunization.** / A one-time dose of Tdap vaccine. After that, you need a Td booster dose every 10 years.  Measles, mumps, rubella (MMR) immunization. / You need at least 1 dose of MMR if you were born in 1957 or later. You may also need a second dose.  Varicella immunization.** / Consult your caregiver.  Meningococcal immunization.** / Consult your caregiver.  Hepatitis A immunization.** / Consult your caregiver. 2 doses, 6 to 18 months apart.  Hepatitis B immunization.** / Consult your caregiver. 3 doses, usually over 6 months. Ages 58  and over  Blood pressure check.** / Every 1 to 2 years.  Lipid and cholesterol check.** / Every 5 years beginning at age 28.  Clinical breast exam.** / Every year after age 39.  Mammogram.** / Every year beginning at age 71 and continuing for as long as you are in good health. Consult with your caregiver.  Pap test.** / Every 3 years starting at age 63 through age 67 or 17 with a 3 consecutive normal Pap tests. Testing can be stopped between 65 and 70 with 3 consecutive normal Pap tests and no abnormal Pap or HPV tests in the past 10 years.  HPV screening.** / Every 3 years from ages 69 through ages 69 or 35 with a history of 3 consecutive normal Pap tests. Testing can be stopped between 65 and 70 with 3 consecutive normal Pap tests and no abnormal Pap or HPV tests in the past 10 years.  Fecal occult blood test (FOBT) of stool. / Every year beginning at age 51 and continuing until age 20. You may not need to do this test if you get a colonoscopy every 10 years.  Flexible sigmoidoscopy or colonoscopy.** / Every 5 years for a flexible sigmoidoscopy or every 10 years for a colonoscopy beginning at age 58 and continuing until age 42.  Hepatitis C blood test.** / For all people born from [birth year] through 1965 and any individual with known  risks for hepatitis C.  Osteoporosis screening.** / A one-time screening for women ages 60 and over and women at risk for fractures or osteoporosis.  Skin self-exam. / Monthly.  Influenza immunization.** / Every year.  Pneumococcal polysaccharide immunization.** / 1 dose at age 46 (or older) if you have never been vaccinated.  Tetanus, diphtheria, pertussis (Tdap, Td) immunization. / A one-time dose of Tdap vaccine if you are over 65 and have contact with an infant, are a Research scientist (physical sciences), or simply want to be protected from whooping cough. After that, you need a Td booster dose every 10 years.  Varicella immunization.** / Consult your  caregiver.  Meningococcal immunization.** / Consult your caregiver.  Hepatitis A immunization.** / Consult your caregiver. 2 doses, 6 to 18 months apart.  Hepatitis B immunization.** / Check with your caregiver. 3 doses, usually over 6 months. ** Family history and personal history of risk and conditions may change your caregiver's recommendations. Document Released: 11/28/2001 Document Revised: 12/25/2011 Document Reviewed: 02/27/2011 [facility] Patient Information 2013 [place], [place].

## 2012-09-10 ENCOUNTER — Telehealth: Payer: Self-pay | Admitting: Internal Medicine

## 2012-09-10 ENCOUNTER — Ambulatory Visit (INDEPENDENT_AMBULATORY_CARE_PROVIDER_SITE_OTHER): Payer: 59 | Admitting: Internal Medicine

## 2012-09-10 ENCOUNTER — Encounter: Payer: Self-pay | Admitting: Internal Medicine

## 2012-09-10 VITALS — BP 130/74 | HR 93 | Temp 98.4°F | Wt 238.0 lb

## 2012-09-10 DIAGNOSIS — N951 Menopausal and female climacteric states: Secondary | ICD-10-CM

## 2012-09-10 DIAGNOSIS — R030 Elevated blood-pressure reading, without diagnosis of hypertension: Secondary | ICD-10-CM

## 2012-09-10 DIAGNOSIS — R42 Dizziness and giddiness: Secondary | ICD-10-CM

## 2012-09-10 DIAGNOSIS — R519 Headache, unspecified: Secondary | ICD-10-CM | POA: Insufficient documentation

## 2012-09-10 DIAGNOSIS — R51 Headache: Secondary | ICD-10-CM

## 2012-09-10 MED ORDER — PREDNISONE 20 MG PO TABS: ORAL_TABLET | ORAL | Status: DC

## 2012-09-10 NOTE — Progress Notes (Signed)
Chief Complaint  Patient presents with  . Headache    Has been having high BP reading at [facility].    HPI: Patient comes in today for SDA for  new problem evaluation. Bp at drug store was 190/90 or something there about and was told it wasn't that high. She went there because she was having new symptoms.  And ha and taking ibuprofen for HAs.   No tobacco.   Swimmy headed for 2 days then developed ha about a week ago  then after a while she developed while insidious onset  headache but it is difficult to describe pressure-like but is history of pounding at time and in different areas of her head although more on the right than the left we believe. She does get intermittent vertigo and dizziness and takes Antivert. Headache 3/10 at this point when it was bad it was 10 out of 10. Different type of headache. Ha frontal to bad mostly frontal.  Some nausea  . No vision changes hearing changes vomiting is taking ibuprofen 3-4 to time or Aleve 2-3 a time. Occasional Excedrin Migraine or other over-the-counter usually only takes one caffeine a day. ROS: See pertinent positives and negatives per HPI. No chest pain shortness of breath complaining of pain on the bottom of her feet when she 6 for while complains like a burning pain but no weakness falling. Has had a head cold a few weeks ago that is all better and no sinus pain or fever or sig congestion. She is taking a number of supplements mostly vitamins however she does take black cohosh for hot flushes. This is not new.  Past Medical History  Diagnosis Date  . Vertigo     intermittent infrequent  . Drug addiction     recovering  . Hx of varicella   . Substance abuse in remission 13 years ago     cocaine heroiin  met   support via [facility]  . Ex-smoker 2009  . History of fracture of wrist 17 years ago    has plate and pins right   . Hx of hysterectomy     bleeding and cysts    Family History  Problem Relation Age of Onset  . COPD Mother   . Diabetes  Mother   . Hypertension Mother   . Sleep apnea Mother   . Thyroid disease Mother   . Chronic fatigue Mother   . Glaucoma Father   . Other Father     prostate disease  . Osteoarthritis Mother     knee replacement   . Alcohol abuse      drug parent    History   Social History  . Marital Status: Single    Spouse Name: N/A    Number of Children: N/A  . Years of Education: N/A   Social History Main Topics  . Smoking status: Former Games developer  . Smokeless tobacco: None  . Alcohol Use: Yes     Comment: socially  . Drug Use: Yes    Special: "Crack" cocaine, Heroin, Methamphetamines, Marijuana     Comment: History- 13 years clean  . Sexually Active: None   Other Topics Concern  . None   Social History Narrative   Works at [employer] for [employer] offices also [employer] [place] second job Single neg tad except rare etoh.College educationNeg firearms HH of 1Sleep about 5 hours G2P1   Sees [facility]    Outpatient Encounter Prescriptions as of 09/10/2012  Medication Sig Dispense  Refill  . meclizine (ANTIVERT) 25 MG tablet Take 1 tablet (25 mg total) by mouth 3 (three) times daily as needed for dizziness or nausea.  30 tablet  1  . predniSONE (DELTASONE) 20 MG tablet Take 3 po qd for 2 days then 2 po qd for 3 -5 days,or as directed  16 tablet  0    EXAM:  BP 130/74  Pulse 93  Temp 98.4 F (36.9 C) (Oral)  Wt 238 lb (107.956 kg)  SpO2 97%  LMP 12/15/2011  There is no height on file to calculate BMI. Blood pressure repeated confirmed. GENERAL: vitals reviewed and listed above, alert, oriented, appears well hydrated and in no acute distress  HEENT: Normocephalic ;atraumatic , Eyes;  PERRL, EOMs  Full, lids and conjunctiva clear,,Ears: no deformities, canals nl, TM landmarks normal, Nose: no deformity or discharge  Mouth : OP clear without lesion or edema . Fundi discs appear sharp no obvious abnormality  NECK: no obvious masses on inspection palpation no bruit is heard but can hear  heart sounds on the left carotid LUNGS: clear to auscultation bilaterally, no wheezes, rales or rhonchi, good air movement  CV: HRRR, no clubbing cyanosis or  peripheral edema nl cap refill   MS: moves all extremities without noticeable focal  abnormality NEURO: oriented x 3 CN 3-12 appear intact. No focal muscle weakness or atrophy. DTRs symmetrical hard to elicit no clonus . Gait WNL.  Grossly non focal. No tremor or abnormal movement.neg rhomberg and f to nose no tremor   PSYCH: pleasant and cooperative, no obvious depression or anxiety  ASSESSMENT AND PLAN:  Discussed the following assessment and plan:  1. Headache    lasting almost a week / cause insiisuious hard to cateforizie non focal sx and bp ok  optinos discussed scanning referral etc.ids use of meds can use pred   2. Elevated blood pressure reading    Normal and confirmed her in the office visit  3. VERTIGO    History of same takes when necessary Antivert  4. Menopausal hot flushes    Taking supplements would avoid until headache better    -Patient advised to return or notify health care team  immediately if symptoms worsen or persist or new concerns arise.  Patient Instructions  Uncertain why you have this headache but could be  Migraine variant   Or residual   from the uri and now rebound headache   Stop the  Supplements temporarily .  And avoid excedrin migraine as this can cause rebound headaches.  Contact us next week if not getting better .consdier further evaluation  . caffiene can trigger headaches and help headaches temporarily.   Neta Mends. Panosh M.D.

## 2012-09-10 NOTE — Telephone Encounter (Signed)
Patient Information:  Caller Name: Antonio  Phone: [phone]  Patient: Monique Nelson, Monique Nelson  Gender: Female  DOB: 1966-06-06  Age: 46 Years  PCP: Berniece Andreas [facility])  Pregnant: No   Symptoms  Reason For Call & Symptoms: headache  Reviewed Health History In EMR: Yes  Reviewed Medications In EMR: Yes  Reviewed Allergies In EMR: Yes  Date of Onset of Symptoms: 09/03/2012  Treatments Tried: Ibuprofen  Treatments Tried Worked: No OB:  LMP: Unknown  Guideline(s) Used:  Headache  Disposition Per Guideline:   See Today in Office  Reason For Disposition Reached:   Patient wants to be seen  Advice Given:  N/A  Office Follow Up:  Does the office need to follow up with this patient?: No  Instructions For The Office: N/A  Appointment Scheduled:  09/10/2012 11:15:00

## 2012-09-10 NOTE — Patient Instructions (Addendum)
Uncertain why you have this headache but could be  Migraine variant   Or residual   from the uri and now rebound headache   Stop the  Supplements temporarily .  And avoid excedrin migraine as this can cause rebound headaches.  Contact us next week if not getting better .consdier further evaluation  . caffiene can trigger headaches and help headaches temporarily.

## 2012-12-24 ENCOUNTER — Telehealth: Payer: Self-pay | Admitting: Internal Medicine

## 2012-12-24 NOTE — Telephone Encounter (Signed)
I did not feel this medicine until just now was unaware she wanted to be seen today medicines very late in the day.  Can put her in  For tomorrow Wednesday . March 11

## 2012-12-24 NOTE — Telephone Encounter (Signed)
Pt is having nausea. Anything pt eats gives a bloated, Pl nauseated, sick feeling on stomach.  Pt would like to come in asap.  "Nothing but same day appt"   Pls advise/

## 2012-12-26 NOTE — Telephone Encounter (Signed)
appt set

## 2012-12-27 ENCOUNTER — Encounter: Payer: Self-pay | Admitting: Internal Medicine

## 2012-12-27 ENCOUNTER — Ambulatory Visit (INDEPENDENT_AMBULATORY_CARE_PROVIDER_SITE_OTHER): Payer: 59 | Admitting: Internal Medicine

## 2012-12-27 VITALS — BP 132/70 | HR 76 | Temp 98.8°F | Wt 251.0 lb

## 2012-12-27 DIAGNOSIS — H1013 Acute atopic conjunctivitis, bilateral: Secondary | ICD-10-CM

## 2012-12-27 DIAGNOSIS — K2900 Acute gastritis without bleeding: Secondary | ICD-10-CM

## 2012-12-27 DIAGNOSIS — J309 Allergic rhinitis, unspecified: Secondary | ICD-10-CM

## 2012-12-27 DIAGNOSIS — H1010 Acute atopic conjunctivitis, unspecified eye: Secondary | ICD-10-CM

## 2012-12-27 MED ORDER — FLUTICASONE PROPIONATE 50 MCG/ACT NA SUSP: NASAL | Status: DC

## 2012-12-27 MED ORDER — OLOPATADINE HCL 0.2 % OP SOLN: 1.0000 [drp] | OPHTHALMIC | Status: DC

## 2012-12-27 MED ORDER — PREDNISONE 20 MG PO TABS: ORAL_TABLET | ORAL | Status: DC

## 2012-12-27 NOTE — Patient Instructions (Addendum)
Stay on an antihistamine during the season.  Add daily nasal cortisone spray once a day each nostril to prevent allergy flares. This could also help control I symptoms eventually  In the short run we can try different eye drops for allergy and a short course of prednisone pills to see if the swelling and itching can decrease more quickly.   In the future you should begin controller medicines for allergy at the first beginning of symptoms in addition to your antihistamine  If not improving  as above we can add Singulair which is a medicine you take every day to prevent allergy flare  Hay Fever Hay fever is an allergic reaction to particles in the air. It cannot be passed from person to person. It cannot be cured, but it can be controlled. CAUSES  Hay fever is caused by something that triggers an allergic reaction (allergens). The following are examples of allergens:  Ragweed.  Feathers.  Animal dander.  Grass and tree pollens.  Cigarette smoke.  House dust.  Pollution. SYMPTOMS   Sneezing.  Runny or stuffy nose.  Tearing eyes.  Itchy eyes, nose, mouth, throat, skin, or other area.  Sore throat.  Headache.  Decreased sense of smell or taste. DIAGNOSIS Your caregiver will perform a physical exam and ask questions about the symptoms you are having.Allergy testing may be done to determine exactly what triggers your hay fever.  TREATMENT   Over-the-counter medicines may help symptoms. These include:  Antihistamines.  Decongestants. These may help with nasal congestion.  Your caregiver may prescribe medicines if over-the-counter medicines do not work.  Some people benefit from allergy shots when other medicines are not helpful. HOME CARE INSTRUCTIONS   Avoid the allergen that is causing your symptoms, if possible.  Take all medicine as told by your caregiver. SEEK MEDICAL CARE IF:   You have severe allergy symptoms and your current medicines are not  helping.  Your treatment was working at one time, but you are now experiencing symptoms.  You have sinus congestion and pressure.  You develop a fever or headache.  You have thick nasal discharge.  You have asthma and have a worsening cough and wheezing. SEEK IMMEDIATE MEDICAL CARE IF:   You have swelling of your tongue or lips.  You have trouble breathing.  You feel lightheaded or like you are going to faint.  You have cold sweats.  You have a fever. Document Released: 10/02/2005 Document Revised: 12/25/2011 Document Reviewed: 12/28/2010 [facility] Patient Information 2013 [place], [place].
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
## 2013-01-08 ENCOUNTER — Encounter (HOSPITAL_COMMUNITY): Payer: Self-pay

## 2013-01-08 ENCOUNTER — Emergency Department (HOSPITAL_COMMUNITY)
Admission: EM | Admit: 2013-01-08 | Discharge: 2013-01-08 | Disposition: A | Discharge status: Home / Self Care | Payer: 59 | Attending: Emergency Medicine | Admitting: Emergency Medicine

## 2013-01-08 ENCOUNTER — Emergency Department (HOSPITAL_COMMUNITY): Payer: 59

## 2013-01-08 DIAGNOSIS — H53149 Visual discomfort, unspecified: Secondary | ICD-10-CM | POA: Insufficient documentation

## 2013-01-08 DIAGNOSIS — R1013 Epigastric pain: Secondary | ICD-10-CM | POA: Insufficient documentation

## 2013-01-08 DIAGNOSIS — R110 Nausea: Secondary | ICD-10-CM | POA: Insufficient documentation

## 2013-01-08 DIAGNOSIS — R51 Headache: Secondary | ICD-10-CM | POA: Insufficient documentation

## 2013-01-08 DIAGNOSIS — Z8619 Personal history of other infectious and parasitic diseases: Secondary | ICD-10-CM | POA: Insufficient documentation

## 2013-01-08 DIAGNOSIS — Z79899 Other long term (current) drug therapy: Secondary | ICD-10-CM | POA: Insufficient documentation

## 2013-01-08 DIAGNOSIS — Z8781 Personal history of (healed) traumatic fracture: Secondary | ICD-10-CM | POA: Insufficient documentation

## 2013-01-08 DIAGNOSIS — R109 Unspecified abdominal pain: Secondary | ICD-10-CM

## 2013-01-08 DIAGNOSIS — Z90710 Acquired absence of both cervix and uterus: Secondary | ICD-10-CM | POA: Insufficient documentation

## 2013-01-08 LAB — CBC WITH DIFFERENTIAL/PLATELET
Basophils Absolute: 0 10*3/uL (ref 0.0–0.1)
Basophils Relative: 0 % (ref 0–1)
Eosinophils Absolute: 0 10*3/uL (ref 0.0–0.7)
Eosinophils Relative: 0 % (ref 0–5)
Lymphocytes Relative: 13 % (ref 12–46)
MCHC: 33.7 g/dL (ref 30.0–36.0)
MCV: 83.8 fL (ref 78.0–100.0)
Platelets: 380 10*3/uL (ref 150–400)
RDW: 15.2 % (ref 11.5–15.5)
WBC: 8.8 10*3/uL (ref 4.0–10.5)

## 2013-01-08 LAB — COMPREHENSIVE METABOLIC PANEL
ALT: 27 U/L (ref 0–35)
AST: 21 U/L (ref 0–37)
Albumin: 3.2 g/dL — ABNORMAL LOW (ref 3.5–5.2)
CO2: 25 mEq/L (ref 19–32)
Calcium: 8.8 mg/dL (ref 8.4–10.5)
Sodium: 137 mEq/L (ref 135–145)
Total Protein: 7.8 g/dL (ref 6.0–8.3)

## 2013-01-08 LAB — URINALYSIS, ROUTINE W REFLEX MICROSCOPIC
Glucose, UA: NEGATIVE mg/dL
Leukocytes, UA: NEGATIVE
Specific Gravity, Urine: 1.019 (ref 1.005–1.030)
pH: 5 (ref 5.0–8.0)

## 2013-01-08 LAB — URINE MICROSCOPIC-ADD ON

## 2013-01-08 LAB — TROPONIN I: Troponin I: <0.3 ng/mL (ref <0.30)

## 2013-01-08 MED ORDER — IBUPROFEN 800 MG PO TABS
800.0000 mg | ORAL_TABLET | Freq: Once | ORAL | Status: AC
  Administered 2013-01-08: 800 mg via ORAL
  Filled 2013-01-08: qty 1

## 2013-01-08 MED ORDER — IBUPROFEN 800 MG PO TABS: 800.0000 mg | ORAL_TABLET | Freq: Three times a day (TID) | ORAL | Status: DC | PRN

## 2013-01-08 MED ORDER — SODIUM CHLORIDE 0.9 % IV BOLUS (SEPSIS)
1000.0000 mL | Freq: Once | INTRAVENOUS | Status: AC
  Administered 2013-01-08: 1000 mL via INTRAVENOUS

## 2013-01-08 MED ORDER — MORPHINE SULFATE 4 MG/ML IJ SOLN
4.0000 mg | Freq: Once | INTRAMUSCULAR | Status: AC
  Administered 2013-01-08: 4 mg via INTRAVENOUS
  Filled 2013-01-08: qty 1

## 2013-01-08 MED ORDER — DICYCLOMINE HCL 20 MG PO TABS: 20.0000 mg | ORAL_TABLET | Freq: Two times a day (BID) | ORAL | Status: DC | PRN

## 2013-01-08 MED ORDER — ONDANSETRON HCL 4 MG/2ML IJ SOLN
4.0000 mg | Freq: Once | INTRAMUSCULAR | Status: AC
  Administered 2013-01-08: 4 mg via INTRAVENOUS
  Filled 2013-01-08: qty 2

## 2013-01-08 NOTE — ED Notes (Signed)
Bed:WA25<BR> Expected date:<BR> Expected time:<BR> Means of arrival:<BR> Comments:<BR> ems

## 2013-01-08 NOTE — ED Notes (Signed)
Per EMS pt at work c/o upper abdominal pain states getting worse over the past 2months, denies vomiting but felt nausea and headache.

## 2013-01-08 NOTE — ED Notes (Signed)
Patient transported to X-ray 

## 2013-01-08 NOTE — ED Provider Notes (Signed)
History     CSN: [id number]  Arrival date & time 01/08/13  1103   First MD Initiated Contact with Patient 01/08/13 1106      Chief Complaint  Patient presents with  . Abdominal Pain    (Consider location/radiation/quality/duration/timing/severity/associated sxs/prior treatment) HPI Comments: Pt presents to the ED for abdominal pain and headache.  Abdominal pain is sharp, epigastric, non-radiating and has been present for approximately 2 months.  Pain is progressively getting worse and was unbearable this morning associated with nausea and intermittent palpitations.  Family hx of colon cancer at young ages, due for colonoscopy this year. Headache present for the past 3 days, throbbing in nature localized over her forehead and associated with photophobia.  No hx of migraines.  Pt reports that she has had several high blood pressure readings over the past few months during visits with her PCP but has never been formally diagnosed with HTN or started on medications. Pt denies any CP, SOB, dizziness, blurred vision, diarrhea, dysuria, hematuria, hematochezia or vaginal d/c.  BM normal.  S/p TAH.  Patient is a 47 y.o. female presenting with abdominal pain. The history is provided by the patient.  Abdominal Pain Associated symptoms: nausea     Past Medical History  Diagnosis Date  . Vertigo     intermittent infrequent  . Drug addiction     recovering  . Hx of varicella   . Substance abuse in remission 13 years ago     cocaine heroiin  met   support via ADP  . Ex-smoker 2009  . History of fracture of wrist 17 years ago    has plate and pins right   . Hx of hysterectomy     bleeding and cysts    Past Surgical History  Procedure Laterality Date  . Rt hand surgery      17 years ago  . Ablastion      Family History  Problem Relation Age of Onset  . COPD Mother   . Diabetes Mother   . Hypertension Mother   . Sleep apnea Mother   . Thyroid disease Mother   . Chronic fatigue  Mother   . Glaucoma Father   . Other Father     prostate disease  . Osteoarthritis Mother     knee replacement   . Alcohol abuse      drug parent    History  Substance Use Topics  . Smoking status: Former Games developer  . Smokeless tobacco: Not on file  . Alcohol Use: Yes     Comment: socially    OB History   Grav Para Term Preterm Abortions TAB SAB Ect Mult Living                  Review of Systems  Gastrointestinal: Positive for nausea and abdominal pain.  Neurological: Positive for headaches.  All other systems reviewed and are negative.    Allergies  Review of patient's allergies indicates no known allergies.  Home Medications   Current Outpatient Rx  Name  Route  Sig  Dispense  Refill  . fexofenadine-pseudoephedrine (ALLEGRA-D 24) 180-240 MG per 24 hr tablet   Oral   Take 1 tablet by mouth daily.         . fluticasone (FLONASE) 50 MCG/ACT nasal spray      2 spray each nostril qd   16 g   3   . Olopatadine HCl 0.2 % SOLN   Ophthalmic   Apply 1 drop to  eye 1 day or 1 dose.   1 Bottle   2   . predniSONE (DELTASONE) 20 MG tablet      Take 3 po qd for 2 days then 2 po qd for 3 days,or as directed   12 tablet   0     LMP 12/15/2011  Physical Exam  Nursing note and vitals reviewed. Constitutional: She is oriented to person, place, and time. She appears well-developed and well-nourished.  HENT:  Head: Normocephalic and atraumatic.  Right Ear: Tympanic membrane and ear canal normal.  Left Ear: Tympanic membrane and ear canal normal.  Nose: Nose normal.  Mouth/Throat: Uvula is midline, oropharynx is clear and moist and mucous membranes are normal. No oropharyngeal exudate or posterior oropharyngeal erythema.  Eyes: Conjunctivae and EOM are normal. Pupils are equal, round, and reactive to light.  Neck: Normal range of motion.  Cardiovascular: Normal rate, regular rhythm and normal heart sounds.   Pulmonary/Chest: Effort normal and breath sounds normal.  She has no wheezes.  Abdominal: Soft. Bowel sounds are normal. There is tenderness in the epigastric area and left upper quadrant. There is no CVA tenderness, no tenderness at McBurney's point and negative Murphy's sign.  Musculoskeletal: Normal range of motion. She exhibits no edema.  Neurological: She is alert and oriented to person, place, and time.  Skin: Skin is warm and dry.  Psychiatric: She has a normal mood and affect.    ED Course  Procedures (including critical care time)   Date: 01/08/2013  Rate: 100  Rhythm: sinus tachycardia  QRS Axis: normal  Intervals: normal  ST/T Wave abnormalities: normal  Conduction Disutrbances:none  Narrative Interpretation:   Old EKG Reviewed: none available    Labs Reviewed  CBC WITH DIFFERENTIAL - Abnormal; Notable for the following:    Neutrophils Relative 84 (*)    All other components within normal limits  COMPREHENSIVE METABOLIC PANEL - Abnormal; Notable for the following:    Glucose, Bld 103 (*)    Albumin 3.2 (*)    Total Bilirubin 0.2 (*)    All other components within normal limits  URINALYSIS, ROUTINE W REFLEX MICROSCOPIC - Abnormal; Notable for the following:    Hgb urine dipstick SMALL (*)    All other components within normal limits  URINE MICROSCOPIC-ADD ON - Abnormal; Notable for the following:    Squamous Epithelial / LPF MANY (*)    All other components within normal limits  URINE CULTURE  LIPASE, BLOOD  TROPONIN I   Dg Chest 2 View  01/08/2013  *RADIOLOGY REPORT*  Clinical Data: The upper abdominal pain, shortness of breath.  CHEST - 2 VIEW  Comparison: None.  Findings: Low lung volumes.  Mild peribronchial thickening.  Heart is normal size.  No confluent opacity or effusion.  No acute bony abnormality.  IMPRESSION: Bronchitic changes.  Low lung volumes.   Original Report Authenticated By: Charlett Nose, M.D.      1. Abdominal pain   2. Headache       MDM  CXR, trop, and EKG normal.  U/a abnormal but  without signs of infection, urine culture pending.  CBC, CMP, Lipase without evidence of gross infection or pancreatitis.  Headache and abdominal pain improved with IVF, morphine, and zofran.  Abdominal pain present for 2 months, not likely to be acutely surgical abdomen.  White count stable.  Pt has family hx of GI problems including early colon cancer and is due for colonscopy this year.  Pt has never seen GI  in the past but i feel it would be in her best interest to establish care for further evaluation of her sx.  Pt has had several readings of high blood pressure when evaluated by PCP but never formally dx or started on any medications.  BP 140's/90's in the ED.  Recurrent headaches possibly due to uncontrolled BP.  Will have her follow up with her PCP for BP control and GI referral.  Rx bentyl and ibuprofen- pt is recovering addict and does not want narcotics. Return precautions advised. Pt agreed.        Garlon Hatchet, PA-C 01/08/13 1635

## 2013-01-09 ENCOUNTER — Telehealth: Payer: Self-pay | Admitting: Internal Medicine

## 2013-01-09 ENCOUNTER — Ambulatory Visit: Payer: 59 | Admitting: Internal Medicine

## 2013-01-09 ENCOUNTER — Other Ambulatory Visit: Payer: Self-pay | Admitting: Family Medicine

## 2013-01-09 DIAGNOSIS — R109 Unspecified abdominal pain: Secondary | ICD-10-CM

## 2013-01-09 DIAGNOSIS — R195 Other fecal abnormalities: Secondary | ICD-10-CM

## 2013-01-09 LAB — URINE CULTURE

## 2013-01-09 NOTE — Telephone Encounter (Signed)
I had also spoken with the patient earlier.  She continues to have high bp reading.  Went to the hospital yesterday with bp of 164/100.  She has also been stopping by [facility] and getting readings there.  Most of the time is 120s/90s.  Sometimes higher than that.  She would like to start a bp medication due to being worried about her health and the sx she has/noticed when her bp is high.  She gets light headed and dizzy.  Also has some nausea.  Would like a low dose medication and to come in to the office for a follow up.  The reason for the referral to gastro is because she is having loose stools.  Sometimes cannot make it to the restroom.  Please advise.  Thanks!!

## 2013-01-09 NOTE — Telephone Encounter (Signed)
Appt made and referral placed in the system.

## 2013-01-09 NOTE — ED Provider Notes (Signed)
Medical screening examination/treatment/procedure(s) were performed by non-physician practitioner and as supervising physician I was immediately available for consultation/collaboration.  Derwood Kaplan, MD 01/09/13 [phone]

## 2013-01-09 NOTE — Telephone Encounter (Signed)
Patient called stating that she would like to be referred to Dr. Melvia [facility] for possible IBS as she was at the ER yesterday. Please assist.

## 2013-01-09 NOTE — Telephone Encounter (Signed)
Ok for referral to GI  Ov visit tomorrow to discuss blood pressure if she has a monitor bring it in to the visit

## 2013-01-10 ENCOUNTER — Encounter: Payer: Self-pay | Admitting: Internal Medicine

## 2013-01-10 ENCOUNTER — Ambulatory Visit (INDEPENDENT_AMBULATORY_CARE_PROVIDER_SITE_OTHER): Payer: 59 | Admitting: Internal Medicine

## 2013-01-10 ENCOUNTER — Other Ambulatory Visit: Payer: Self-pay | Admitting: Internal Medicine

## 2013-01-10 ENCOUNTER — Encounter: Payer: Self-pay | Admitting: Gastroenterology

## 2013-01-10 VITALS — BP 126/84 | HR 79 | Temp 98.8°F | Wt 245.0 lb

## 2013-01-10 DIAGNOSIS — R7301 Impaired fasting glucose: Secondary | ICD-10-CM

## 2013-01-10 DIAGNOSIS — R112 Nausea with vomiting, unspecified: Secondary | ICD-10-CM

## 2013-01-10 DIAGNOSIS — Z8379 Family history of other diseases of the digestive system: Secondary | ICD-10-CM

## 2013-01-10 DIAGNOSIS — R1013 Epigastric pain: Secondary | ICD-10-CM

## 2013-01-10 DIAGNOSIS — R197 Diarrhea, unspecified: Secondary | ICD-10-CM | POA: Insufficient documentation

## 2013-01-10 DIAGNOSIS — R030 Elevated blood-pressure reading, without diagnosis of hypertension: Secondary | ICD-10-CM

## 2013-01-10 MED ORDER — OMEPRAZOLE 20 MG PO CPDR: 20.0000 mg | DELAYED_RELEASE_CAPSULE | Freq: Every day | ORAL | Status: DC

## 2013-01-10 NOTE — Patient Instructions (Addendum)
At this time your blood pressure readings are acceptable. Check your blood pressure readings at home or elsewhere and if they are or above 150 on a regular basis contact us. Otherwise we will hold off on adding any medication for this problem I suspect her elevated blood pressure was related to your emergency room visit and a temporary phenomenon.   We have put the order in for referral to gastroenterology I agree that you should be seen this acts like more than 1 year ago bowel or such.  Obtain stool tests to check for certain bacteria and inflammation.  Will contact you about getting an abdominal ultrasound which checks liver pancreas and spleen area.  I do not want to take the ibuprofen at this time but when she did take a medicine that blocks acid and treats ulcers such as  Prilosec one a day until you see the gastroenterologist. I can also give you a medicine physician just use for nausea vomiting that they gave you in the hospital.  This is not curative but j a symptom .     Will send copy of result to Gi specialist.

## 2013-01-10 NOTE — Progress Notes (Signed)
Chief Complaint  Patient presents with  . Follow-up    Hospital.  Has had high bp readings.    HPI:  Patient comes in today followup from emergency room visit for chest lower upper abdomen pain nausea vomiting. At that visit her blood pressure was noted to be 160 he was told to followup with her primary doctor. She had a rule out MI laboratory studies done and was given intravenous pain medicine Zofran and sent out on Bentyl and ibuprofen. Was told she could have your double bowel. However she is getting worse instead of better Any thing eats or drink except water .   Extreme nausea pain after eating occasional vomiting  Diarrhea  Black to yellow.   noblood   Black for one  day uncertain if it was related to Pepto-Bismol Fecal incontinence  . This week awoke with soiling her bed.   peptobismol once and threw up..         Now  Diarrhea  And pain.   Crampy avoiding eating no associated fever stools are frequent about every few hours. She has lost weight she thinks from the illness. NO recent  antibiotic  Recently no camping [place].     ROS: See pertinent positives and negatives per HPI.  Brings in copies of her wellness labs that were done before all of this illness shows borderline blood sugar 106 range slight elevation of cholesterol otherwise normal  Family history:   grandfather has colon cancer her father had issues diverticulitis Brother with Crohn's  sister with colitis  Past Medical History  Diagnosis Date  . Vertigo     intermittent infrequent  . Drug addiction     recovering  . Hx of varicella   . Substance abuse in remission 13 years ago     cocaine heroiin  met   support via ADP  . Ex-smoker 2009  . History of fracture of wrist 17 years ago    has plate and pins right   . Hx of hysterectomy     bleeding and cysts    Family History  Problem Relation Age of Onset  . COPD Mother   . Diabetes Mother   . Hypertension Mother   . Sleep apnea Mother   . Thyroid  disease Mother   . Chronic fatigue Mother   . Glaucoma Father   . Other Father     prostate disease  . Osteoarthritis Mother     knee replacement   . Alcohol abuse      drug parent    History   Social History  . Marital Status: Single    Spouse Name: N/A    Number of Children: N/A  . Years of Education: N/A   Social History Main Topics  . Smoking status: Former Games developer  . Smokeless tobacco: None  . Alcohol Use: Yes     Comment: socially  . Drug Use: Yes    Special: "Crack" cocaine, Heroin, Methamphetamines, Marijuana     Comment: History- 13 years clean  . Sexually Active: None   Other Topics Concern  . 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   College education   Neg firearms    HH of 1   Sleep about 5 hours    G2P1   Sees [facility]    Outpatient Encounter Prescriptions as of 01/10/2013  Medication Sig Dispense Refill  . dicyclomine (BENTYL) 20 MG tablet Take 1 tablet (20 mg total) by mouth 2 (two) times daily as needed.  20 tablet  0  . fexofenadine-pseudoephedrine (ALLEGRA-D 24) 180-240 MG per 24 hr tablet Take 1 tablet by mouth daily.      . fluticasone (FLONASE) 50 MCG/ACT nasal spray 2 spray each nostril qd  16 g  3  . ibuprofen (ADVIL,MOTRIN) 800 MG tablet Take 1 tablet (800 mg total) by mouth 3 (three) times daily as needed for pain.  21 tablet  0  . Olopatadine HCl 0.2 % SOLN Apply 1 drop to eye 1 day or 1 dose.  1 Bottle  2  . omeprazole (PRILOSEC) 20 MG capsule Take 1 capsule (20 mg total) by mouth daily.  15 capsule  3   No facility-administered encounter medications on file as of 01/10/2013.    EXAM:  BP 126/84  Pulse 79  Temp(Src) 98.8 F (37.1 C) (Oral)  Wt 245 lb (111.131 kg)  BMI 45.55 kg/m2  SpO2 98%  LMP 12/15/2011  Body mass index is 45.55 kg/(m^2). Wt Readings from Last 3 Encounters:  01/10/13 245 lb (111.131 kg)  12/27/12 251 lb  (113.853 kg)  09/10/12 238 lb (107.956 kg)    GENERAL: vitals reviewed and listed above, alert, oriented, appears well hydrated and in no acute distress  HEENT: atraumatic, conjunctiva  clear, no obvious abnormalities on inspection of external nose and ears  NECK: no obvious masses on inspection palpation   LUNGS: clear to auscultation bilaterally, no wheezes, rales or rhonchi, good air movement Skin nl color no jaundice  CV: HRRR, no clubbing cyanosis or  peripheral edema nl cap refill  Abd tender all epigastrum and upper aabd ? Vol guarding  No ascites  MS: moves all extremities without noticeable focal  abnormality PSYCH: pleasant and cooperative, no obvious depression or anxiety  ASSESSMENT AND PLAN:  Discussed the following assessment and plan:  Diarrhea - Plan: Clostridium Difficile by PCR, Stool culture, Giardia/cryptosporidium (EIA), Fecal lactoferrin, US Abdomen Complete  Abdominal pain, epigastric - Plan: Clostridium Difficile by PCR, Stool culture, Giardia/cryptosporidium (EIA), Fecal lactoferrin, US Abdomen Complete  Nausea with vomiting - Plan: Clostridium Difficile by PCR, Stool culture, Giardia/cryptosporidium (EIA), Fecal lactoferrin, US Abdomen Complete  Elevated blood pressure reading - Plan: Clostridium Difficile by PCR, Stool culture, Giardia/cryptosporidium (EIA), Fecal lactoferrin  Fasting hyperglycemia  FH: Crohn's disease - sibling. The severity of her illness appears more than a routine gastritis has a family history of Crohn's no other risk factors for C. difficile but would consider that in the differential.   Would avoid ibuprofen at this time add Prilosec get abdominal ultrasound proceed with GI referral as previously planned contact us if worsening   we'll put blood pressure management on hold this this is not an elevated reading today. Get some readings and followup in a month without regard -Patient advised to return or notify health care team  if  symptoms worsen or persist or new concerns arise.  Patient Instructions  At this time your blood pressure readings are acceptable. Check your blood pressure readings at home or elsewhere and if they are or above 150 on a regular basis contact us. Otherwise we will hold off on adding any medication for this problem I suspect her elevated blood pressure was related to your emergency room visit and a temporary phenomenon.   We have put the order in for referral to gastroenterology I agree that you should  be seen this acts like more than 1 year ago bowel or such.  Obtain stool tests to check for certain bacteria and inflammation.  Will contact you about getting an abdominal ultrasound which checks liver pancreas and spleen area.  I do not want to take the ibuprofen at this time but when she did take a medicine that blocks acid and treats ulcers such as  Prilosec one a day until you see the gastroenterologist. I can also give you a medicine physician just use for nausea vomiting that they gave you in the hospital.  This is not curative but j a symptom .     Will send copy of result to Gi specialist.     Neta Mends. Monique Nelson M.D.

## 2013-01-14 ENCOUNTER — Other Ambulatory Visit: Payer: 59

## 2013-01-15 LAB — FECAL LACTOFERRIN, QUANT: Lactoferrin: POSITIVE

## 2013-01-16 LAB — GIARDIA/CRYPTOSPORIDIUM (EIA): Giardia Screen (EIA): NEGATIVE

## 2013-01-18 LAB — STOOL CULTURE

## 2013-01-22 ENCOUNTER — Telehealth: Payer: Self-pay | Admitting: Internal Medicine

## 2013-01-22 NOTE — Telephone Encounter (Signed)
PT requesting lab results from 01/10/13. She's "very disturbed" by the fact that she has not heard from anyone yet. Please assist.

## 2013-01-22 NOTE — Telephone Encounter (Signed)
Tried to return the patient's call.  Per Joyice Faster, pt misunderstood when test results came back.  Noted in the results note.  See that note.

## 2013-01-23 ENCOUNTER — Telehealth: Payer: Self-pay

## 2013-01-23 NOTE — Progress Notes (Signed)
Quick Note:  Pt called and I spoke with there. Pt states her stomach still bothers her and pt would like to know what the next step is. Pt has an upcoming appt on 03/06/13. Pls advise. ______

## 2013-01-23 NOTE — Telephone Encounter (Signed)
Pt would like a refill of Meclizine.

## 2013-01-24 ENCOUNTER — Other Ambulatory Visit: Payer: Self-pay | Admitting: Family Medicine

## 2013-01-24 MED ORDER — METRONIDAZOLE 250 MG PO TABS: 250.0000 mg | ORAL_TABLET | Freq: Three times a day (TID) | ORAL | Status: DC

## 2013-01-24 MED ORDER — MECLIZINE HCL 25 MG PO TABS: 25.0000 mg | ORAL_TABLET | Freq: Three times a day (TID) | ORAL | Status: DC | PRN

## 2013-01-24 MED ORDER — MECLIZINE HCL 25 MG PO TABS: 25.0000 mg | ORAL_TABLET | Freq: Three times a day (TID) | ORAL | Status: AC | PRN

## 2013-01-24 NOTE — Telephone Encounter (Signed)
Sent to the pharmacy by e-scribe. 

## 2013-01-24 NOTE — Addendum Note (Signed)
Addended by: Azucena Freed on: 01/24/2013 04:35 PM   Modules accepted: Orders

## 2013-01-24 NOTE — Telephone Encounter (Signed)
Ok to refill 30   #  X 1

## 2013-01-24 NOTE — Progress Notes (Signed)
Quick Note:  Called and spoke with pt and pt is aware that Flagyl was sent to pharmacy. ______

## 2013-01-24 NOTE — Telephone Encounter (Signed)
Given by [facility] in January for nausea and dizziness.  Please advise.  Thanks!!

## 2013-02-04 ENCOUNTER — Ambulatory Visit: Payer: 59 | Admitting: Gastroenterology

## 2013-02-05 ENCOUNTER — Other Ambulatory Visit: Payer: 59

## 2013-02-17 ENCOUNTER — Ambulatory Visit: Payer: 59 | Admitting: Gastroenterology

## 2013-02-28 ENCOUNTER — Telehealth: Payer: Self-pay | Admitting: Internal Medicine

## 2013-02-28 NOTE — Telephone Encounter (Signed)
Pt would like to have a pap only. Pt last cpx on 08-02-2012. Pt had hysterectomy. Pt would misty to return her call

## 2013-03-03 NOTE — Telephone Encounter (Signed)
Left message on home/cell for the pt to return my call. 

## 2013-03-04 NOTE — Telephone Encounter (Signed)
I spoke to the pt.  She will no longer be seeing her ob/gyn unless absolutely necessary.  She would like for [facility] to take over pap and other annual testing.  I made an appt for her on 04/02/13 @ 12.  I see she had a hysterectomy.  Will she need pap?  Last pap was in April.  Pt is now overdue.  Please advise.  Thanks!!

## 2013-03-05 NOTE — Telephone Encounter (Signed)
We can do th is but uncertain if needed   Usually if has hysterectomy done need ths unless they left the cervix in place .and If she is not having a problem . Ok to keep appt  And will disc at visit

## 2013-04-02 ENCOUNTER — Encounter: Payer: 59 | Admitting: Internal Medicine

## 2013-04-02 ENCOUNTER — Encounter: Payer: Self-pay | Admitting: Internal Medicine

## 2013-04-02 DIAGNOSIS — Z0289 Encounter for other administrative examinations: Secondary | ICD-10-CM

## 2013-04-02 NOTE — Progress Notes (Signed)
Document opened and reviewed for OV  But pt NS .

## 2013-07-30 ENCOUNTER — Encounter: Payer: 59 | Admitting: Internal Medicine

## 2013-08-05 ENCOUNTER — Other Ambulatory Visit (HOSPITAL_COMMUNITY)
Admission: RE | Admit: 2013-08-05 | Discharge: 2013-08-05 | Disposition: A | Discharge status: Home / Self Care | Payer: 59 | Source: Ambulatory Visit | Attending: Internal Medicine | Admitting: Internal Medicine

## 2013-08-05 ENCOUNTER — Ambulatory Visit (INDEPENDENT_AMBULATORY_CARE_PROVIDER_SITE_OTHER): Payer: 59 | Admitting: Internal Medicine

## 2013-08-05 ENCOUNTER — Encounter: Payer: Self-pay | Admitting: Internal Medicine

## 2013-08-05 VITALS — BP 124/86 | HR 88 | Temp 98.2°F | Wt 259.0 lb

## 2013-08-05 DIAGNOSIS — G479 Sleep disorder, unspecified: Secondary | ICD-10-CM | POA: Insufficient documentation

## 2013-08-05 DIAGNOSIS — A499 Bacterial infection, unspecified: Secondary | ICD-10-CM

## 2013-08-05 DIAGNOSIS — N760 Acute vaginitis: Secondary | ICD-10-CM | POA: Insufficient documentation

## 2013-08-05 DIAGNOSIS — B9689 Other specified bacterial agents as the cause of diseases classified elsewhere: Secondary | ICD-10-CM

## 2013-08-05 DIAGNOSIS — Z23 Encounter for immunization: Secondary | ICD-10-CM

## 2013-08-05 MED ORDER — PHENTERMINE HCL 15 MG PO CAPS: 15.0000 mg | ORAL_CAPSULE | ORAL | Status: DC

## 2013-08-05 MED ORDER — METRONIDAZOLE 500 MG PO TABS: 500.0000 mg | ORAL_TABLET | Freq: Two times a day (BID) | ORAL | Status: DC

## 2013-08-05 NOTE — Progress Notes (Signed)
Chief Complaint  Patient presents with  . Vaginal Discharge    Started Wednesday of last week.  Tried Monistat.  Did not help.    HPI: Patient comes in today for SDA for  new problem evaluation. Has a few other issues also   Smells bad  Fishy odor  About 1 week or so and used otc monistat  .   NO EXPOSURES has had hysterectomy for non cancer reasons   Doing weight watchers etc and still having hard time with weight .  ? belviq   Had been given pHetermine last year by [facility] but never picked it up.   No hx of stimulant subs abuse use  Clean for years and doesnt think would be a problem.    Sleep  Wakes up a bit in the recent past melatonin nd  Black kohash  Some hot flushes  No etoh  ROS: See pertinent positives and negatives per HPI.  Past Medical History  Diagnosis Date  . Vertigo     intermittent infrequent  . Drug addiction     recovering  . Hx of varicella   . Substance abuse in remission 13 years ago     cocaine heroiin  met   support via ADP  . Ex-smoker 2009  . History of fracture of wrist 17 years ago    has plate and pins right   . Hx of hysterectomy     bleeding and cysts    Family History  Problem Relation Age of Onset  . COPD Mother   . Diabetes Mother   . Hypertension Mother   . Sleep apnea Mother   . Thyroid disease Mother   . Chronic fatigue Mother   . Glaucoma Father   . Other Father     prostate disease  . Osteoarthritis Mother     knee replacement   . Alcohol abuse      drug parent    History   Social History  . Marital Status: Single    Spouse Name: N/A    Number of Children: N/A  . Years of Education: N/A   Social History Main Topics  . Smoking status: Former Games developer  . Smokeless tobacco: None  . Alcohol Use: Yes     Comment: socially  . Drug Use: Yes    Special: "Crack" cocaine, Heroin, Methamphetamines, Marijuana     Comment: History- 13 years clean  . Sexual Activity: None   Other Topics Concern  . 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   College education   Neg firearms    HH of 1   Sleep about 5 hours    G2P1   Sees [facility]    Outpatient Encounter Prescriptions as of 08/05/2013  Medication Sig Dispense Refill  . fexofenadine-pseudoephedrine (ALLEGRA-D 24) 180-240 MG per 24 hr tablet Take 1 tablet by mouth daily.      . fluticasone (FLONASE) 50 MCG/ACT nasal spray 2 spray each nostril qd  16 g  3  . meclizine (ANTIVERT) 25 MG tablet Take 1 tablet (25 mg total) by mouth 3 (three) times daily as needed for dizziness or nausea.  30 tablet  1  . Olopatadine HCl 0.2 % SOLN Apply 1 drop to eye 1 day or 1 dose.  1 Bottle  2  . omeprazole (PRILOSEC) 20 MG capsule Take  1 capsule (20 mg total) by mouth daily.  15 capsule  3  . metroNIDAZOLE (FLAGYL) 500 MG tablet Take 1 tablet (500 mg total) by mouth 2 (two) times daily.  14 tablet  0  . phentermine 15 MG capsule Take 1 capsule (15 mg total) by mouth every morning.  30 capsule  0  . [DISCONTINUED] dicyclomine (BENTYL) 20 MG tablet Take 1 tablet (20 mg total) by mouth 2 (two) times daily as needed.  20 tablet  0  . [DISCONTINUED] ibuprofen (ADVIL,MOTRIN) 800 MG tablet Take 1 tablet (800 mg total) by mouth 3 (three) times daily as needed for pain.  21 tablet  0  . [DISCONTINUED] metroNIDAZOLE (FLAGYL) 250 MG tablet Take 1 tablet (250 mg total) by mouth 3 (three) times daily.  21 tablet  0   No facility-administered encounter medications on file as of 08/05/2013.    EXAM:  BP 124/86  Pulse 88  Temp(Src) 98.2 F (36.8 C) (Oral)  Wt 259 lb (117.482 kg)  BMI 48.15 kg/m2  SpO2 98%  LMP 12/15/2011  Body mass index is 48.15 kg/(m^2).  GENERAL: vitals reviewed and listed above, alert, oriented, appears well hydrated and in no acute distress  HEENT: atraumatic, conjunctiva  clear, no obvious abnormalities on inspection of external nose and ears NECK: no  obvious masses on inspection palpation  abd soft no masses obv  No adenopathy Ext gu nl mild erythem white gray dc pink mucosa no lesion sample take for vaginalysis  Cytology  MS: movesll extremities without noticeable focal  abnormality PSYCH: pleasant and cooperative, no obvious depression or anxiety  ASSESSMENT AND PLAN:  Discussed the following assessment and plan:  Bacterial vaginosis - most likely dx  rx and await tests  Morbid obesity - problematic belviqu vs phentermine not goood sucess but coiuld add trial pnbe pt aware of risk potential.  Sleep difficulties - poss perimenopausal  take melatonin early ier  fu  Need for prophylactic vaccination and inoculation against influenza - Plan: Flu Vaccine QUAD 36+ mos PF IM (Fluarix)  -Patient advised to return or notify health care team  if symptoms worsen or persist or new concerns arise.  Patient Instructions  Ithink this is BV  Take med  And will let you know results when availabe.  Intensify lifestyle interventions. Suggests bring own lunch and limit cheese. dont skip meal time Phentermine is a stimulant controlled substance take in am . Can be dependent producing   ROV in 1 month to assess response .  Sleep  Could be menopausal Take  Melatonin 30-4 hours before sleep.  Sometimes we add estrogen  If needed  But would not do this at this time.  No caffien or decongestants   That could interfere with sleep also.    Insomnia Insomnia is frequent trouble falling and/or staying asleep. Insomnia can be a long term problem or a short term problem. Both are common. Insomnia can be a short term problem when the wakefulness is related to a certain stress or worry. Long term insomnia is often related to ongoing stress during waking hours and/or poor sleeping habits. Overtime, sleep deprivation itself can make the problem worse. Every little thing feels more severe because you are overtired and your ability to cope is decreased. CAUSES    Stress, anxiety, and depression.  Poor sleeping habits.  Distractions such as TV in the bedroom.  Naps close to bedtime.  Engaging in emotionally charged conversations before bed.  Technical reading before sleep.  Alcohol and other sedatives. They may make the problem worse. They can hurt normal sleep patterns and normal dream activity.  Stimulants such as caffeine for several hours prior to bedtime.  Pain syndromes and shortness of breath can cause insomnia.  Exercise late at night.  Changing time zones may cause sleeping problems (jet lag). It is sometimes helpful to have someone observe your sleeping patterns. They should look for periods of not breathing during the night (sleep apnea). They should also look to see how long those periods last. If you live alone or observers are uncertain, you can also be observed at a sleep clinic where your sleep patterns will be professionally monitored. Sleep apnea requires a checkup and treatment. Give your caregivers your medical history. Give your caregivers observations your family has made about your sleep.  SYMPTOMS   Not feeling rested in the morning.  Anxiety and restlessness at bedtime.  Difficulty falling and staying asleep. TREATMENT   Your caregiver may prescribe treatment for an underlying medical disorders. Your caregiver can give advice or help if you are using alcohol or other drugs for self-medication. Treatment of underlying problems will usually eliminate insomnia problems.  Medications can be prescribed for short time use. They are generally not recommended for lengthy use.  Over-the-counter sleep medicines are not recommended for lengthy use. They can be habit forming.  You can promote easier sleeping by making lifestyle changes such as:  Using relaxation techniques that help with breathing and reduce muscle tension.  Exercising earlier in the day.  Changing your diet and the time of your last meal. No night  time snacks.  Establish a regular time to go to bed.  Counseling can help with stressful problems and worry.  Soothing music and white noise may be helpful if there are background noises you cannot remove.  Stop tedious detailed work at least one hour before bedtime. HOME CARE INSTRUCTIONS   Keep a diary. Inform your caregiver about your progress. This includes any medication side effects. See your caregiver regularly. Take note of:  Times when you are asleep.  Times when you are awake during the night.  The quality of your sleep.  How you feel the next day. This information will help your caregiver care for you.  Get out of bed if you are still awake after 15 minutes. Read or do some quiet activity. Keep the lights down. Wait until you feel sleepy and go back to bed.  Keep regular sleeping and waking hours. Avoid naps.  Exercise regularly.  Avoid distractions at bedtime. Distractions include watching television or engaging in any intense or detailed activity like attempting to balance the household checkbook.  Develop a bedtime ritual. Keep a familiar routine of bathing, brushing your teeth, climbing into bed at the same time each night, listening to soothing music. Routines increase the success of falling to sleep faster.  Use relaxation techniques. This can be using breathing and muscle tension release routines. It can also include visualizing peaceful scenes. You can also help control troubling or intruding thoughts by keeping your mind occupied with boring or repetitive thoughts like the old concept of counting sheep. You can make it more creative like imagining planting one beautiful flower after another in your backyard garden.  During your day, work to eliminate stress. When this is not possible use some of the previous suggestions to help reduce the anxiety that accompanies stressful situations. MAKE SURE YOU:   Understand these instructions.  Will watch your  condition.  Will get help right away if you are not doing well or get worse. Document Released: 09/29/2000 Document Revised: 12/25/2011 Document Reviewed: 10/30/2007 [facility] Patient Information 2014 [place], [place].      Neta Mends. Panosh M.D.

## 2013-08-05 NOTE — Patient Instructions (Addendum)
Ithink this is BV  Take med  And will let you know results when availabe.  Intensify lifestyle interventions. Suggests bring own lunch and limit cheese. dont skip meal time Phentermine is a stimulant controlled substance take in am . Can be dependent producing   ROV in 1 month to assess response .  Sleep  Could be menopausal Take  Melatonin 30-4 hours before sleep.  Sometimes we add estrogen  If needed  But would not do this at this time.  No caffien or decongestants   That could interfere with sleep also.    Insomnia Insomnia is frequent trouble falling and/or staying asleep. Insomnia can be a long term problem or a short term problem. Both are common. Insomnia can be a short term problem when the wakefulness is related to a certain stress or worry. Long term insomnia is often related to ongoing stress during waking hours and/or poor sleeping habits. Overtime, sleep deprivation itself can make the problem worse. Every little thing feels more severe because you are overtired and your ability to cope is decreased. CAUSES   Stress, anxiety, and depression.  Poor sleeping habits.  Distractions such as TV in the bedroom.  Naps close to bedtime.  Engaging in emotionally charged conversations before bed.  Technical reading before sleep.  Alcohol and other sedatives. They may make the problem worse. They can hurt normal sleep patterns and normal dream activity.  Stimulants such as caffeine for several hours prior to bedtime.  Pain syndromes and shortness of breath can cause insomnia.  Exercise late at night.  Changing time zones may cause sleeping problems (jet lag). It is sometimes helpful to have someone observe your sleeping patterns. They should look for periods of not breathing during the night (sleep apnea). They should also look to see how long those periods last. If you live alone or observers are uncertain, you can also be observed at a sleep clinic where your sleep patterns  will be professionally monitored. Sleep apnea requires a checkup and treatment. Give your caregivers your medical history. Give your caregivers observations your family has made about your sleep.  SYMPTOMS   Not feeling rested in the morning.  Anxiety and restlessness at bedtime.  Difficulty falling and staying asleep. TREATMENT   Your caregiver may prescribe treatment for an underlying medical disorders. Your caregiver can give advice or help if you are using alcohol or other drugs for self-medication. Treatment of underlying problems will usually eliminate insomnia problems.  Medications can be prescribed for short time use. They are generally not recommended for lengthy use.  Over-the-counter sleep medicines are not recommended for lengthy use. They can be habit forming.  You can promote easier sleeping by making lifestyle changes such as:  Using relaxation techniques that help with breathing and reduce muscle tension.  Exercising earlier in the day.  Changing your diet and the time of your last meal. No night time snacks.  Establish a regular time to go to bed.  Counseling can help with stressful problems and worry.  Soothing music and white noise may be helpful if there are background noises you cannot remove.  Stop tedious detailed work at least one hour before bedtime. HOME CARE INSTRUCTIONS   Keep a diary. Inform your caregiver about your progress. This includes any medication side effects. See your caregiver regularly. Take note of:  Times when you are asleep.  Times when you are awake during the night.  The quality of your sleep.  How you feel  the next day. This information will help your caregiver care for you.  Get out of bed if you are still awake after 15 minutes. Read or do some quiet activity. Keep the lights down. Wait until you feel sleepy and go back to bed.  Keep regular sleeping and waking hours. Avoid naps.  Exercise regularly.  Avoid  distractions at bedtime. Distractions include watching television or engaging in any intense or detailed activity like attempting to balance the household checkbook.  Develop a bedtime ritual. Keep a familiar routine of bathing, brushing your teeth, climbing into bed at the same time each night, listening to soothing music. Routines increase the success of falling to sleep faster.  Use relaxation techniques. This can be using breathing and muscle tension release routines. It can also include visualizing peaceful scenes. You can also help control troubling or intruding thoughts by keeping your mind occupied with boring or repetitive thoughts like the old concept of counting sheep. You can make it more creative like imagining planting one beautiful flower after another in your backyard garden.  During your day, work to eliminate stress. When this is not possible use some of the previous suggestions to help reduce the anxiety that accompanies stressful situations. MAKE SURE YOU:   Understand these instructions.  Will watch your condition.  Will get help right away if you are not doing well or get worse. Document Released: 09/29/2000 Document Revised: 12/25/2011 Document Reviewed: 10/30/2007 [facility] Patient Information 2014 [place], [place].

## 2013-08-05 NOTE — Addendum Note (Signed)
Addended by: Raj Janus T on: 08/05/2013 01:16 PM   Modules accepted: Orders

## 2013-08-08 ENCOUNTER — Ambulatory Visit: Payer: 59 | Admitting: Internal Medicine

## 2013-09-02 ENCOUNTER — Ambulatory Visit: Payer: 59 | Admitting: Internal Medicine

## 2013-09-02 DIAGNOSIS — Z0289 Encounter for other administrative examinations: Secondary | ICD-10-CM

## 2013-10-01 ENCOUNTER — Encounter: Payer: Self-pay | Admitting: Family Medicine

## 2013-10-01 ENCOUNTER — Ambulatory Visit (INDEPENDENT_AMBULATORY_CARE_PROVIDER_SITE_OTHER): Payer: 59 | Admitting: Family Medicine

## 2013-10-01 VITALS — BP 124/70 | HR 97 | Temp 98.7°F | Wt 258.0 lb

## 2013-10-01 DIAGNOSIS — J019 Acute sinusitis, unspecified: Secondary | ICD-10-CM

## 2013-10-01 MED ORDER — AMOXICILLIN-POT CLAVULANATE 875-125 MG PO TABS: 1.0000 | ORAL_TABLET | Freq: Two times a day (BID) | ORAL | Status: DC

## 2013-10-01 NOTE — Progress Notes (Signed)
   Subjective:    Patient ID: Monique Nelson, female    DOB: Jan 09, 1966, 47 y.o.   MRN: [id number]  HPI Acute visit Three-week history of yellowish nasal discharge, headaches, facial pain, sore throat, and occasional dry cough Started with what she thought was a typical cold. Her nasal discharge was initially clear and she seemed to be improving. She's now had almost 2 weeks of yellowish nasal discharge. She's tried Mucinex, nasal saline, and decongestants without improvement.  Past Medical History  Diagnosis Date  . Vertigo     intermittent infrequent  . Drug addiction     recovering  . Hx of varicella   . Substance abuse in remission 13 years ago     cocaine heroiin  met   support via [employer]  . Ex-smoker 2009  . History of fracture of wrist 17 years ago    has plate and pins right   . Hx of hysterectomy     bleeding and cysts   Past Surgical History  Procedure Laterality Date  . Rt hand surgery      17 years ago  . Ablastion      reports that she has quit smoking. She does not have any smokeless tobacco history on file. She reports that she drinks alcohol. She reports that she uses illicit drugs ("Crack" cocaine, Heroin, Methamphetamines, and Marijuana). family history includes Alcohol abuse in an other family member; COPD in her mother; Chronic fatigue in her mother; Diabetes in her mother; Glaucoma in her father; Hypertension in her mother; Osteoarthritis in her mother; Other in her father; Sleep apnea in her mother; Thyroid disease in her mother. No Known Allergies    Review of Systems  Constitutional: Negative for fever and chills.  HENT: Positive for congestion.   Respiratory: Positive for cough.   Neurological: Positive for headaches.       Objective:   Physical Exam  Constitutional: She appears well-developed and well-nourished.  HENT:  Left Ear: External ear normal.  Mouth/Throat: Oropharynx is clear and moist.  Right eardrum is slightly erythematous  Neck:  Neck supple.  Cardiovascular: Normal rate.   Pulmonary/Chest: Effort normal and breath sounds normal. No respiratory distress. She has no wheezes. She has no rales.  Lymphadenopathy:    She has no cervical adenopathy.          Assessment & Plan:  Acute sinusitis. Given duration of symptoms start Augmentin 875 mg twice daily for 10 days. Continue Mucinex. Followup as needed

## 2013-10-01 NOTE — Patient Instructions (Signed)

## 2013-10-01 NOTE — Progress Notes (Signed)
Pre visit review using our clinic review tool, if applicable. No additional management support is needed unless otherwise documented below in the visit note. 

## 2013-10-28 ENCOUNTER — Telehealth: Payer: Self-pay | Admitting: Internal Medicine

## 2013-10-28 NOTE — Telephone Encounter (Signed)
Patient Information:  Caller Name: Monique Nelson  Phone: [phone]  Patient: Monique Nelson, Monique Nelson  Gender: Female  DOB: 15-Apr-1966  Age: 48 Years  PCP: Monique Nelson, Monique Nelson (Family Practice)  Pregnant: No  Office Follow Up:  Does the office need to follow up with this patient?: Yes  Instructions For The Office: would like to hear from office prior to making an appt; [facility]; please call back  RN Note:  informed Monique Nelson that an evaluation would most likely be needed, since her sxs has returned; requests that Nelson note be sent to Dr.Panosh to see if she would call in an abx for [facility]  Symptoms  Reason For Call & Symptoms: c/o worsening cough; cough had been dry, but feels like it is congested today; head also congested; completed Nelson course of Augmentin 12/27 for cold sxs; said she had improvement with that and would like another round; denies wheezing  Reviewed Health History In EMR: Yes  Reviewed Medications In EMR: Yes  Reviewed Allergies In EMR: Yes  Reviewed Surgeries / Procedures: Yes  Date of Onset of Symptoms: 10/26/2013  Treatments Tried: cough med  Treatments Tried Worked: No OB / GYN:  LMP: Unknown  Guideline(s) Used:  Cough  Disposition Per Guideline:   See Today or Tomorrow in Office  Reason For Disposition Reached:   Continuous (nonstop) coughing interferes with work or school and no improvement using cough treatment per Care Advice  Advice Given:  N/A  RN Overrode Recommendation:  Follow Up With Office Later  would like to hear from office prior to making an appt

## 2013-10-28 NOTE — Telephone Encounter (Signed)
Pt seen Dr. Caryl NeverBurchette in Dec.  Would you like to call in medication or have the pt in the office?

## 2013-10-29 ENCOUNTER — Encounter (HOSPITAL_COMMUNITY): Payer: Self-pay | Admitting: Emergency Medicine

## 2013-10-29 ENCOUNTER — Emergency Department (INDEPENDENT_AMBULATORY_CARE_PROVIDER_SITE_OTHER): Payer: 59

## 2013-10-29 ENCOUNTER — Emergency Department (HOSPITAL_COMMUNITY)
Admission: EM | Admit: 2013-10-29 | Discharge: 2013-10-29 | Disposition: A | Discharge status: Home / Self Care | Payer: 59 | Source: Home / Self Care | Attending: Emergency Medicine | Admitting: Emergency Medicine

## 2013-10-29 DIAGNOSIS — J45909 Unspecified asthma, uncomplicated: Secondary | ICD-10-CM

## 2013-10-29 MED ORDER — AZITHROMYCIN 250 MG PO TABS: ORAL_TABLET | ORAL | Status: DC

## 2013-10-29 MED ORDER — METHYLPREDNISOLONE ACETATE 80 MG/ML IJ SUSP
80.0000 mg | Freq: Once | INTRAMUSCULAR | Status: AC
  Administered 2013-10-29: 80 mg via INTRAMUSCULAR

## 2013-10-29 MED ORDER — IPRATROPIUM BROMIDE 0.02 % IN SOLN
0.5000 mg | Freq: Once | RESPIRATORY_TRACT | Status: AC
  Administered 2013-10-29: 0.5 mg via RESPIRATORY_TRACT

## 2013-10-29 MED ORDER — IPRATROPIUM BROMIDE 0.02 % IN SOLN
RESPIRATORY_TRACT | Status: AC
  Filled 2013-10-29: qty 2.5

## 2013-10-29 MED ORDER — FLUCONAZOLE 150 MG PO TABS: 150.0000 mg | ORAL_TABLET | Freq: Once | ORAL | Status: DC

## 2013-10-29 MED ORDER — HYDROCOD POLST-CHLORPHEN POLST 10-8 MG/5ML PO LQCR: 5.0000 mL | Freq: Two times a day (BID) | ORAL | Status: DC | PRN

## 2013-10-29 MED ORDER — METHYLPREDNISOLONE ACETATE 80 MG/ML IJ SUSP
INTRAMUSCULAR | Status: AC
  Filled 2013-10-29: qty 1

## 2013-10-29 MED ORDER — ALBUTEROL SULFATE (2.5 MG/3ML) 0.083% IN NEBU
INHALATION_SOLUTION | RESPIRATORY_TRACT | Status: AC
  Filled 2013-10-29: qty 6

## 2013-10-29 MED ORDER — ALBUTEROL SULFATE (5 MG/ML) 0.5% IN NEBU
5.0000 mg | INHALATION_SOLUTION | Freq: Once | RESPIRATORY_TRACT | Status: AC
  Administered 2013-10-29: 5 mg via RESPIRATORY_TRACT

## 2013-10-29 MED ORDER — ALBUTEROL SULFATE HFA 108 (90 BASE) MCG/ACT IN AERS: 2.0000 | INHALATION_SPRAY | Freq: Four times a day (QID) | RESPIRATORY_TRACT | Status: DC

## 2013-10-29 MED ORDER — PREDNISONE 20 MG PO TABS: ORAL_TABLET | ORAL | Status: DC

## 2013-10-29 NOTE — ED Notes (Signed)
Recently treated for bronchitis, sinusitis and an ear infection per patient, finished antibiotic last week.  Patient reports cough started 2 days ago and non productive.

## 2013-10-29 NOTE — Discharge Instructions (Signed)

## 2013-10-29 NOTE — Telephone Encounter (Signed)
Spoke to the pt.  She has made an appt for 10/31/13 @ 9:15

## 2013-10-29 NOTE — Telephone Encounter (Signed)
Advise office visit if she is concerned  (another antibiotic may not help)

## 2013-10-29 NOTE — ED Provider Notes (Signed)
Chief Complaint   Chief Complaint  Patient presents with  . Shortness of Breath    History of Present Illness   Monique Nelson is a 48 year old female who has had a three-week history of a nonproductive cough. This started out with a viral type illness. She saw her primary care physician, Dr. Berniece Andreas. She was diagnosed as having an bronchitis, sinus infection, and an ear infection. She was given Augmentin, and felt better for a while, but then became worse again. Right now she has a nonproductive cough, chest tightness, wheezing, shortness of breath, aching in her ribs. She's had a slight sore throat which she believes is secondary to coughing. She has paroxysms of cough. She denies any fever, chills, nasal congestion, or rhinorrhea. She has no history of asthma or allergies. She denies any postnasal drip or reflux symptoms.  Review of Systems   Other than as noted above, the patient denies any of the following symptoms: Systemic:  No fevers, chills, sweats, weight loss or gain, or fatigue. ENT:  No nasal congestion, sneezing, itching, postnasal drip, sinus pressure, headache, sore throat, or hoarseness. Lungs:  No wheezing, shortness of breath, chest tightness or congestion. Heart:  No chest pain, tightness, pressure, PND, orthopnea, or ankle edema. GI:  No indigestion, heartburn, waterbrash, burping, abdominal pain, nausea, or vomiting.  PMFSH   Past medical history, family history, social history, meds, and allergies were reviewed.  Specifically, there is no history of asthma, allergies, reflux esophagitis or cigarette smoking.  Physical Examination     Vital signs:  BP 111/47  Pulse 100  Temp(Src) 99.3 F (37.4 C) (Oral)  Resp 40  SpO2 100%  LMP 12/15/2011 General:  Alert and oriented.  In no distress.  Skin warm and dry. ENT: TMs and ear canals normal.  Nasal mucosa normal, without drainage.  Pharynx clear without exudate or drainage.  No intraoral lesions. Neck:  No  adenopathy, tenderness or mass.  No JVD. Lungs:  No respiratory distress.  Breath sounds clear and equal bilaterally.  No wheezes, rales or rhonchi. Heart:  Regular rhythm, no gallops or murmers.  No pedal edema. Abdomon:  Soft and nontender.  No organomegaly or mass.  Radiology   Dg Chest 2 View  10/29/2013   CLINICAL DATA:  Acute bronchitis and cough  EXAM: CHEST  2 VIEW  COMPARISON:  January 08, 2013  FINDINGS: Lungs are clear. Heart size and pulmonary vascularity are normal. No adenopathy. No bone lesions.  IMPRESSION: No abnormality noted.   Electronically Signed   By: Bretta Bang M.D.   On: 10/29/2013 18:47    [facility]   She was given Depo-Medrol 80 mg IM and a DuoNeb breathing treatment. Thereafter she felt better. Her lungs remain clear to auscultation.  Assessment   The encounter diagnosis was Asthmatic bronchitis.  With reactive airways disease.  Plan     1.  Meds:  The following meds were prescribed:   New Prescriptions   ALBUTEROL (PROVENTIL HFA;VENTOLIN HFA) 108 (90 BASE) MCG/ACT INHALER    Inhale 2 puffs into the lungs 4 (four) times daily.   AZITHROMYCIN (ZITHROMAX Z-PAK) 250 MG TABLET    Take as directed.   CHLORPHENIRAMINE-HYDROCODONE (TUSSIONEX) 10-8 MG/5ML LQCR    Take 5 mLs by mouth every 12 (twelve) hours as needed for cough.   FLUCONAZOLE (DIFLUCAN) 150 MG TABLET    Take 1 tablet (150 mg total) by mouth once.   PREDNISONE (DELTASONE) 20 MG TABLET  3 daily for 5 days, 2 daily for 5 days, 1 daily for 5 days.    2.  Patient Education/Counseling:  The patient was given appropriate handouts, self care instructions, and instructed in symptomatic relief.   3.  Follow up:  The patient was told to follow up here if no better in 3 to 4 days, or sooner if becoming worse in any way, and given some red flag symptoms such as difficulty breathing or chest pain which would prompt immediate return.  Follow up with Dr. Fabian SharpPanosh if no better in 2  weeks.       Reuben Likesavid C Eusevio Schriver, MD 10/29/13 [phone]

## 2013-10-31 ENCOUNTER — Encounter: Payer: 59 | Admitting: Internal Medicine

## 2013-10-31 NOTE — Progress Notes (Signed)
   Subjective:    Patient ID: Monique Nelson, female    DOB: 1966-04-30, [age] y.o.   MRN: [id number]  HPI  Document opened and reviewed for OV but appt  No showed  same day .   Review of Systems     Objective:   Physical Exam        Assessment & Plan:

## 2013-11-03 ENCOUNTER — Ambulatory Visit: Payer: 59 | Admitting: Internal Medicine

## 2013-11-03 DIAGNOSIS — Z0289 Encounter for other administrative examinations: Secondary | ICD-10-CM

## 2013-11-17 ENCOUNTER — Encounter: Payer: Self-pay | Admitting: Internal Medicine

## 2013-11-17 ENCOUNTER — Ambulatory Visit (INDEPENDENT_AMBULATORY_CARE_PROVIDER_SITE_OTHER): Payer: 59 | Admitting: Internal Medicine

## 2013-11-17 VITALS — BP 110/74 | HR 76 | Temp 98.0°F | Ht 61.75 in | Wt 255.0 lb

## 2013-11-17 DIAGNOSIS — R0683 Snoring: Secondary | ICD-10-CM | POA: Insufficient documentation

## 2013-11-17 DIAGNOSIS — Z820 Family history of epilepsy and other diseases of the nervous system: Secondary | ICD-10-CM

## 2013-11-17 DIAGNOSIS — Z0000 Encounter for general adult medical examination without abnormal findings: Secondary | ICD-10-CM

## 2013-11-17 DIAGNOSIS — G4733 Obstructive sleep apnea (adult) (pediatric): Secondary | ICD-10-CM | POA: Insufficient documentation

## 2013-11-17 DIAGNOSIS — N951 Menopausal and female climacteric states: Secondary | ICD-10-CM | POA: Insufficient documentation

## 2013-11-17 DIAGNOSIS — Z8489 Family history of other specified conditions: Secondary | ICD-10-CM

## 2013-11-17 DIAGNOSIS — G479 Sleep disorder, unspecified: Secondary | ICD-10-CM

## 2013-11-17 DIAGNOSIS — R0989 Other specified symptoms and signs involving the circulatory and respiratory systems: Secondary | ICD-10-CM

## 2013-11-17 DIAGNOSIS — R0609 Other forms of dyspnea: Secondary | ICD-10-CM

## 2013-11-17 LAB — BASIC METABOLIC PANEL
BUN: 15 mg/dL (ref 6–23)
CHLORIDE: 107 meq/L (ref 96–112)
CO2: 28 mEq/L (ref 19–32)
Calcium: 8.9 mg/dL (ref 8.4–10.5)
Creatinine, Ser: 0.7 mg/dL (ref 0.4–1.2)
GFR: 118.79 mL/min (ref >60.00)
GLUCOSE: 53 mg/dL — AB (ref 70–99)
Potassium: 3.3 mEq/L — ABNORMAL LOW (ref 3.5–5.1)
Sodium: 142 mEq/L (ref 135–145)

## 2013-11-17 LAB — LIPID PANEL
CHOLESTEROL: 190 mg/dL (ref 0–200)
HDL: 42.7 mg/dL (ref >39.00)
LDL Cholesterol: 122 mg/dL — ABNORMAL HIGH (ref 0–99)
TRIGLYCERIDES: 127 mg/dL (ref 0.0–149.0)
Total CHOL/HDL Ratio: 4
VLDL: 25.4 mg/dL (ref 0.0–40.0)

## 2013-11-17 LAB — TSH: TSH: 1.14 u[IU]/mL (ref 0.35–5.50)

## 2013-11-17 LAB — HEPATIC FUNCTION PANEL
ALT: 18 U/L (ref 0–35)
AST: 11 U/L (ref 0–37)
Albumin: 3.5 g/dL (ref 3.5–5.2)
Alkaline Phosphatase: 81 U/L (ref 39–117)
Bilirubin, Direct: 0 mg/dL (ref 0.0–0.3)
TOTAL PROTEIN: 7.6 g/dL (ref 6.0–8.3)
Total Bilirubin: 0.6 mg/dL (ref 0.3–1.2)

## 2013-11-17 LAB — CBC WITH DIFFERENTIAL/PLATELET
BASOS ABS: 0 10*3/uL (ref 0.0–0.1)
BASOS PCT: 0.3 % (ref 0.0–3.0)
EOS ABS: 0.1 10*3/uL (ref 0.0–0.7)
Eosinophils Relative: 0.8 % (ref 0.0–5.0)
HCT: 38.8 % (ref 36.0–46.0)
HEMOGLOBIN: 12.4 g/dL (ref 12.0–15.0)
Lymphocytes Relative: 29.7 % (ref 12.0–46.0)
Lymphs Abs: 4.6 10*3/uL — ABNORMAL HIGH (ref 0.7–4.0)
MCHC: 31.9 g/dL (ref 30.0–36.0)
MCV: 88 fl (ref 78.0–100.0)
MONO ABS: 0.9 10*3/uL (ref 0.1–1.0)
Monocytes Relative: 5.8 % (ref 3.0–12.0)
NEUTROS ABS: 9.7 10*3/uL — AB (ref 1.4–7.7)
Neutrophils Relative %: 63.4 % (ref 43.0–77.0)
Platelets: 391 10*3/uL (ref 150.0–400.0)
RBC: 4.41 Mil/uL (ref 3.87–5.11)
RDW: 16.4 % — AB (ref 11.5–14.6)
WBC: 15.4 10*3/uL — ABNORMAL HIGH (ref 4.5–10.5)

## 2013-11-17 LAB — HEMOGLOBIN A1C: Hgb A1c MFr Bld: 6.6 % — ABNORMAL HIGH (ref 4.6–6.5)

## 2013-11-17 LAB — FOLLICLE STIMULATING HORMONE: FSH: 42.7 m[IU]/mL

## 2013-11-17 NOTE — Patient Instructions (Addendum)
Get a mammogram 150 minutes of exercise weeks  ,  Lose weight  To healthy levels. Avoid trans fats and processed foods;  Increase fresh fruits and veges to 5 servings per day. And avoid sweet beverages  Including tea and juice. These are the best way to prevent diabetes  You could have sleep apnea with fam hx and snoring  Will arrange referral  To evaluate. Will notify you  of labs when available. We may have you begin on low dose hormones to see if helps sleep and hot flushes  After I get labs back. Patch may be safer than pills.  dont need paps because you had hysterectomy.and no hx of cancer .  Preventive Care for Adults, Female A healthy lifestyle and preventive care can promote health and wellness. Preventive health guidelines for women include the following key practices.  A routine yearly physical is a good way to check with your health care provider about your health and preventive screening. It is a chance to share any concerns and updates on your health and to receive a thorough exam.  Visit your dentist for a routine exam and preventive care every 6 months. Brush your teeth twice a day and floss once a day. Good oral hygiene prevents tooth decay and gum disease.  The frequency of eye exams is based on your age, health, family medical history, use of contact lenses, and other factors. Follow your health care provider's recommendations for frequency of eye exams.  Eat a healthy diet. Foods like vegetables, fruits, whole grains, low-fat dairy products, and lean protein foods contain the nutrients you need without too many calories. Decrease your intake of foods high in solid fats, added sugars, and salt. Eat the right amount of calories for you.Get information about a proper diet from your health care provider, if necessary.  Regular physical exercise is one of the most important things you can do for your health. Most adults should get at least 150 minutes of moderate-intensity  exercise (any activity that increases your heart rate and causes you to sweat) each week. In addition, most adults need muscle-strengthening exercises on 2 or more days a week.  Maintain a healthy weight. The body mass index (BMI) is a screening tool to identify possible weight problems. It provides an estimate of body fat based on height and weight. Your health care provider can find your BMI, and can help you achieve or maintain a healthy weight.For adults 20 years and older:  A BMI below 18.5 is considered underweight.  A BMI of 18.5 to 24.9 is normal.  A BMI of 25 to 29.9 is considered overweight.  A BMI of 30 and above is considered obese.  Maintain normal blood lipids and cholesterol levels by exercising and minimizing your intake of saturated fat. Eat a balanced diet with plenty of fruit and vegetables. Blood tests for lipids and cholesterol should begin at age 63 and be repeated every 5 years. If your lipid or cholesterol levels are high, you are over 50, or you are at high risk for heart disease, you may need your cholesterol levels checked more frequently.Ongoing high lipid and cholesterol levels should be treated with medicines if diet and exercise are not working.  If you smoke, find out from your health care provider how to quit. If you do not use tobacco, do not start.  Lung cancer screening is recommended for adults aged 45 80 years who are at high risk for developing lung cancer because of a  history of smoking. A yearly low-dose CT scan of the lungs is recommended for people who have at least a 30-pack-year history of smoking and are a current smoker or have quit within the past 15 years. A pack year of smoking is smoking an average of 1 pack of cigarettes a day for 1 year (for example: 1 pack a day for 30 years or 2 packs a day for 15 years). Yearly screening should continue until the smoker has stopped smoking for at least 15 years. Yearly screening should be stopped for people  who develop a health problem that would prevent them from having lung cancer treatment.  If you are pregnant, do not drink alcohol. If you are breastfeeding, be very cautious about drinking alcohol. If you are not pregnant and choose to drink alcohol, do not have more than 1 drink per day. One drink is considered to be 12 ounces (355 mL) of beer, 5 ounces (148 mL) of wine, or 1.5 ounces (44 mL) of liquor.  Avoid use of street drugs. Do not share needles with anyone. Ask for help if you need support or instructions about stopping the use of drugs.  High blood pressure causes heart disease and increases the risk of stroke. Your blood pressure should be checked at least every 1 to 2 years. Ongoing high blood pressure should be treated with medicines if weight loss and exercise do not work.  If you are 48 48 years old, ask your health care provider if you should take aspirin to prevent strokes.  Diabetes screening involves taking a blood sample to check your fasting blood sugar level. This should be done once every 3 years, after age 48, if you are within normal weight and without risk factors for diabetes. Testing should be considered at a younger age or be carried out more frequently if you are overweight and have at least 1 risk factor for diabetes.  Breast cancer screening is essential preventive care for women. You should practice "breast self-awareness." This means understanding the normal appearance and feel of your breasts and may include breast self-examination. Any changes detected, no matter how small, should be reported to a health care provider. Women in their 48s and 30s should have a clinical breast exam (CBE) by a health care provider as part of a regular health exam every 1 to 3 years. After age 48, women should have a CBE every year. Starting at age 48, women should consider having a mammogram (breast X-ray test) every year. Women who have a family history of breast cancer should talk to  their health care provider about genetic screening. Women at a high risk of breast cancer should talk to their health care providers about having an MRI and a mammogram every year.  Breast cancer gene (BRCA)-related cancer risk assessment is recommended for women who have family members with BRCA-related cancers. BRCA-related cancers include breast, ovarian, tubal, and peritoneal cancers. Having family members with these cancers may be associated with an increased risk for harmful changes (mutations) in the breast cancer genes BRCA1 and BRCA2. Results of the assessment will determine the need for genetic counseling and BRCA1 and BRCA2 testing.  The Pap test is a screening test for cervical cancer. A Pap test can show cell changes on the cervix that might become cervical cancer if left untreated. A Pap test is a procedure in which cells are obtained and examined from the lower end of the uterus (cervix).  Women should have a Pap test  starting at age 51.  Between ages 90 and 36, Pap tests should be repeated every 2 years.  Beginning at age 42, you should have a Pap test every 3 years as long as the past 3 Pap tests have been normal.  Some women have medical problems that increase the chance of getting cervical cancer. Talk to your health care provider about these problems. It is especially important to talk to your health care provider if a new problem develops soon after your last Pap test. In these cases, your health care provider may recommend more frequent screening and Pap tests.  The above recommendations are the same for women who have or have not gotten the vaccine for human papillomavirus (HPV).  If you had a hysterectomy for a problem that was not cancer or a condition that could lead to cancer, then you no longer need Pap tests. Even if you no longer need a Pap test, a regular exam is a good idea to make sure no other problems are starting.  If you are between ages 79 and 46 years, and you  have had normal Pap tests going back 10 years, you no longer need Pap tests. Even if you no longer need a Pap test, a regular exam is a good idea to make sure no other problems are starting.  If you have had past treatment for cervical cancer or a condition that could lead to cancer, you need Pap tests and screening for cancer for at least 20 years after your treatment.  If Pap tests have been discontinued, risk factors (such as a new sexual partner) need to be reassessed to determine if screening should be resumed.  The HPV test is an additional test that may be used for cervical cancer screening. The HPV test looks for the virus that can cause the cell changes on the cervix. The cells collected during the Pap test can be tested for HPV. The HPV test could be used to screen women aged 27 years and older, and should be used in women of any age who have unclear Pap test results. After the age of 60, women should have HPV testing at the same frequency as a Pap test.  Colorectal cancer can be detected and often prevented. Most routine colorectal cancer screening begins at the age of 24 years and continues through age 41 years. However, your health care provider may recommend screening at an earlier age if you have risk factors for colon cancer. On a yearly basis, your health care provider may provide home test kits to check for hidden blood in the stool. Use of a small camera at the end of a tube, to directly examine the colon (sigmoidoscopy or colonoscopy), can detect the earliest forms of colorectal cancer. Talk to your health care provider about this at age 34, when routine screening begins. Direct exam of the colon should be repeated every 5 10 years through age 48 years, unless early forms of pre-cancerous polyps or small growths are found.  People who are at an increased risk for hepatitis B should be screened for this virus. You are considered at high risk for hepatitis B if:  You were born in a  country where hepatitis B occurs often. Talk with your health care provider about which countries are considered high risk.  Your parents were born in a high-risk country and you have not received a shot to protect against hepatitis B (hepatitis B vaccine).  You have HIV or AIDS.  You use needles to inject street drugs.  You live with, or have sex with, someone who has Hepatitis B.  You get hemodialysis treatment.  You take certain medicines for conditions like cancer, organ transplantation, and autoimmune conditions.  Hepatitis C blood testing is recommended for all people born from [birth year] through 1965 and any individual with known risks for hepatitis C.  Practice safe sex. Use condoms and avoid high-risk sexual practices to reduce the spread of sexually transmitted infections (STIs). STIs include gonorrhea, chlamydia, syphilis, trichomonas, herpes, HPV, and human immunodeficiency virus (HIV). Herpes, HIV, and HPV are viral illnesses that have no cure. They can result in disability, cancer, and death. Sexually active women aged 75 years and younger should be checked for chlamydia. Older women with new or multiple partners should also be tested for chlamydia. Testing for other STIs is recommended if you are sexually active and at increased risk.  Osteoporosis is a disease in which the bones lose minerals and strength with aging. This can result in serious bone fractures or breaks. The risk of osteoporosis can be identified using a bone density scan. Women ages 76 years and over and women at risk for fractures or osteoporosis should discuss screening with their health care providers. Ask your health care provider whether you should take a calcium supplement or vitamin D to reduce the rate of osteoporosis.  Menopause can be associated with physical symptoms and risks. Hormone replacement therapy is available to decrease symptoms and risks. You should talk to your health care provider about whether  hormone replacement therapy is right for you.  Use sunscreen. Apply sunscreen liberally and repeatedly throughout the day. You should seek shade when your shadow is shorter than you. Protect yourself by wearing long sleeves, pants, a wide-brimmed hat, and sunglasses year round, whenever you are outdoors.  Once a month, do a whole body skin exam, using a mirror to look at the skin on your back. Tell your health care provider of new moles, moles that have irregular borders, moles that are larger than a pencil eraser, or moles that have changed in shape or color.  Stay current with required vaccines (immunizations).  Influenza vaccine. All adults should be immunized every year.  Tetanus, diphtheria, and acellular pertussis (Td, Tdap) vaccine. Pregnant women should receive 1 dose of Tdap vaccine during each pregnancy. The dose should be obtained regardless of the length of time since the last dose. Immunization is preferred during the 27th 36th week of gestation. An adult who has not previously received Tdap or who does not know her vaccine status should receive 1 dose of Tdap. This initial dose should be followed by tetanus and diphtheria toxoids (Td) booster doses every 10 years. Adults with an unknown or incomplete history of completing a 3-dose immunization series with Td-containing vaccines should begin or complete a primary immunization series including a Tdap dose. Adults should receive a Td booster every 10 years.  Varicella vaccine. An adult without evidence of immunity to varicella should receive 2 doses or a second dose if she has previously received 1 dose. Pregnant females who do not have evidence of immunity should receive the first dose after pregnancy. This first dose should be obtained before leaving the health care facility. The second dose should be obtained 4 8 weeks after the first dose.  Human papillomavirus (HPV) vaccine. Females aged 4 26 years who have not received the vaccine  previously should obtain the 3-dose series. The vaccine is not recommended for use  in pregnant females. However, pregnancy testing is not needed before receiving a dose. If a female is found to be pregnant after receiving a dose, no treatment is needed. In that case, the remaining doses should be delayed until after the pregnancy. Immunization is recommended for any person with an immunocompromised condition through the age of 56 years if she did not get any or all doses earlier. During the 3-dose series, the second dose should be obtained 4 8 weeks after the first dose. The third dose should be obtained 24 weeks after the first dose and 16 weeks after the second dose.  Zoster vaccine. One dose is recommended for adults aged 79 years or older unless certain conditions are present.  Measles, mumps, and rubella (MMR) vaccine. Adults born before [birth year] generally are considered immune to measles and mumps. Adults born in [birth year] or later should have 1 or more doses of MMR vaccine unless there is a contraindication to the vaccine or there is laboratory evidence of immunity to each of the three diseases. A routine second dose of MMR vaccine should be obtained at least 28 days after the first dose for students attending postsecondary schools, health care workers, or international travelers. People who received inactivated measles vaccine or an unknown type of measles vaccine during 1963 1967 should receive 2 doses of MMR vaccine. People who received inactivated mumps vaccine or an unknown type of mumps vaccine before 1979 and are at high risk for mumps infection should consider immunization with 2 doses of MMR vaccine. For females of childbearing age, rubella immunity should be determined. If there is no evidence of immunity, females who are not pregnant should be vaccinated. If there is no evidence of immunity, females who are pregnant should delay immunization until after pregnancy. Unvaccinated health care workers born  before [birth year] who lack laboratory evidence of measles, mumps, or rubella immunity or laboratory confirmation of disease should consider measles and mumps immunization with 2 doses of MMR vaccine or rubella immunization with 1 dose of MMR vaccine.  Pneumococcal 13-valent conjugate (PCV13) vaccine. When indicated, a person who is uncertain of her immunization history and has no record of immunization should receive the PCV13 vaccine. An adult aged 58 years or older who has certain medical conditions and has not been previously immunized should receive 1 dose of PCV13 vaccine. This PCV13 should be followed with a dose of pneumococcal polysaccharide (PPSV23) vaccine. The PPSV23 vaccine dose should be obtained at least 8 weeks after the dose of PCV13 vaccine. An adult aged 84 years or older who has certain medical conditions and previously received 1 or more doses of PPSV23 vaccine should receive 1 dose of PCV13. The PCV13 vaccine dose should be obtained 1 or more years after the last PPSV23 vaccine dose.  Pneumococcal polysaccharide (PPSV23) vaccine. When PCV13 is also indicated, PCV13 should be obtained first. All adults aged 18 years and older should be immunized. An adult younger than age 75 years who has certain medical conditions should be immunized. Any person who resides in a nursing home or long-term care facility should be immunized. An adult smoker should be immunized. People with an immunocompromised condition and certain other conditions should receive both PCV13 and PPSV23 vaccines. People with human immunodeficiency virus (HIV) infection should be immunized as soon as possible after diagnosis. Immunization during chemotherapy or radiation therapy should be avoided. Routine use of PPSV23 vaccine is not recommended for American Indians, [place] Natives, or people younger than 65 years unless there are  medical conditions that require PPSV23 vaccine. When indicated, people who have unknown immunization and  have no record of immunization should receive PPSV23 vaccine. One-time revaccination 5 years after the first dose of PPSV23 is recommended for people aged 29 64 years who have chronic kidney failure, nephrotic syndrome, asplenia, or immunocompromised conditions. People who received 1 2 doses of PPSV23 before age 50 years should receive another dose of PPSV23 vaccine at age 9 years or later if at least 5 years have passed since the previous dose. Doses of PPSV23 are not needed for people immunized with PPSV23 at or after age 82 years.  Meningococcal vaccine. Adults with asplenia or persistent complement component deficiencies should receive 2 doses of quadrivalent meningococcal conjugate (MenACWY-D) vaccine. The doses should be obtained at least 2 months apart. Microbiologists working with certain meningococcal bacteria, military recruits, people at risk during an outbreak, and people who travel to or live in countries with a high rate of meningitis should be immunized. A first-year college student up through age 66 years who is living in a residence hall should receive a dose if she did not receive a dose on or after her 16th birthday. Adults who have certain high-risk conditions should receive one or more doses of vaccine.  Hepatitis A vaccine. Adults who wish to be protected from this disease, have certain high-risk conditions, work with hepatitis A-infected animals, work in hepatitis A research labs, or travel to or work in countries with a high rate of hepatitis A should be immunized. Adults who were previously unvaccinated and who anticipate close contact with an international adoptee during the first 60 days after arrival in the [place] from a country with a high rate of hepatitis A should be immunized.  Hepatitis B vaccine. Adults who wish to be protected from this disease, have certain high-risk conditions, may be exposed to blood or other infectious body fluids, are household contacts or sex  partners of hepatitis B positive people, are clients or workers in certain care facilities, or travel to or work in countries with a high rate of hepatitis B should be immunized.  Haemophilus influenzae type b (Hib) vaccine. A previously unvaccinated person with asplenia or sickle cell disease or having a scheduled splenectomy should receive 1 dose of Hib vaccine. Regardless of previous immunization, a recipient of a hematopoietic stem cell transplant should receive a 3-dose series 6 12 months after her successful transplant. Hib vaccine is not recommended for adults with HIV infection. Preventive Services / Frequency Ages 68 to 39years  Blood pressure check.** / Every 1 to 2 years.  Lipid and cholesterol check.** / Every 5 years beginning at age 48.  Clinical breast exam.** / Every 3 years for women in their 65s and 30s.  BRCA-related cancer risk assessment.** / For women who have family members with a BRCA-related cancer (breast, ovarian, tubal, or peritoneal cancers).  Pap test.** / Every 2 years from ages 76 through 61. Every 3 years starting at age 27 through age 60 or 41 with a history of 3 consecutive normal Pap tests.  HPV screening.** / Every 3 years from ages 47 through ages 8 to 74 with a history of 3 consecutive normal Pap tests.  Hepatitis C blood test.** / For any individual with known risks for hepatitis C.  Skin self-exam. / Monthly.  Influenza vaccine. / Every year.  Tetanus, diphtheria, and acellular pertussis (Tdap, Td) vaccine.** / Consult your health care provider. Pregnant women should receive 1 dose of Tdap  vaccine during each pregnancy. 1 dose of Td every 10 years.  Varicella vaccine.** / Consult your health care provider. Pregnant females who do not have evidence of immunity should receive the first dose after pregnancy.  HPV vaccine. / 3 doses over 6 months, if 26 and younger. The vaccine is not recommended for use in pregnant females. However, pregnancy testing  is not needed before receiving a dose.  Measles, mumps, rubella (MMR) vaccine.** / You need at least 1 dose of MMR if you were born in 1957 or later. You may also need a 2nd dose. For females of childbearing age, rubella immunity should be determined. If there is no evidence of immunity, females who are not pregnant should be vaccinated. If there is no evidence of immunity, females who are pregnant should delay immunization until after pregnancy.  Pneumococcal 13-valent conjugate (PCV13) vaccine.** / Consult your health care provider.  Pneumococcal polysaccharide (PPSV23) vaccine.** / 1 to 2 doses if you smoke cigarettes or if you have certain conditions.  Meningococcal vaccine.** / 1 dose if you are age 51 to 53 years and a Orthoptist living in a residence hall, or have one of several medical conditions, you need to get vaccinated against meningococcal disease. You may also need additional booster doses.  Hepatitis A vaccine.** / Consult your health care provider.  Hepatitis B vaccine.** / Consult your health care provider.  Haemophilus influenzae type b (Hib) vaccine.** / Consult your health care provider. Ages 78 to 64years  Blood pressure check.** / Every 1 to 2 years.  Lipid and cholesterol check.** / Every 5 years beginning at age 39 years.  Lung cancer screening. / Every year if you are aged 65 80 years and have a 30-pack-year history of smoking and currently smoke or have quit within the past 15 years. Yearly screening is stopped once you have quit smoking for at least 15 years or develop a health problem that would prevent you from having lung cancer treatment.  Clinical breast exam.** / Every year after age 81 years.  BRCA-related cancer risk assessment.** / For women who have family members with a BRCA-related cancer (breast, ovarian, tubal, or peritoneal cancers).  Mammogram.** / Every year beginning at age 82 years and continuing for as long as you are in good  health. Consult with your health care provider.  Pap test.** / Every 3 years starting at age 60 years through age 67 or 42 years with a history of 3 consecutive normal Pap tests.  HPV screening.** / Every 3 years from ages 21 years through ages 3 to 69 years with a history of 3 consecutive normal Pap tests.  Fecal occult blood test (FOBT) of stool. / Every year beginning at age 16 years and continuing until age 45 years. You may not need to do this test if you get a colonoscopy every 10 years.  Flexible sigmoidoscopy or colonoscopy.** / Every 5 years for a flexible sigmoidoscopy or every 10 years for a colonoscopy beginning at age 3 years and continuing until age 32 years.  Hepatitis C blood test.** / For all people born from [birth year] through 1965 and any individual with known risks for hepatitis C.  Skin self-exam. / Monthly.  Influenza vaccine. / Every year.  Tetanus, diphtheria, and acellular pertussis (Tdap/Td) vaccine.** / Consult your health care provider. Pregnant women should receive 1 dose of Tdap vaccine during each pregnancy. 1 dose of Td every 10 years.  Varicella vaccine.** / Consult your health care provider.  Pregnant females who do not have evidence of immunity should receive the first dose after pregnancy.  Zoster vaccine.** / 1 dose for adults aged 32 years or older.  Measles, mumps, rubella (MMR) vaccine.** / You need at least 1 dose of MMR if you were born in 1957 or later. You may also need a 2nd dose. For females of childbearing age, rubella immunity should be determined. If there is no evidence of immunity, females who are not pregnant should be vaccinated. If there is no evidence of immunity, females who are pregnant should delay immunization until after pregnancy.  Pneumococcal 13-valent conjugate (PCV13) vaccine.** / Consult your health care provider.  Pneumococcal polysaccharide (PPSV23) vaccine.** / 1 to 2 doses if you smoke cigarettes or if you have certain  conditions.  Meningococcal vaccine.** / Consult your health care provider.  Hepatitis A vaccine.** / Consult your health care provider.  Hepatitis B vaccine.** / Consult your health care provider.  Haemophilus influenzae type b (Hib) vaccine.** / Consult your health care provider.

## 2013-11-17 NOTE — Progress Notes (Signed)
Chief Complaint  Patient presents with  . Annual Exam    HPI: Patient comes in today for Preventive Health Care visit   Seen in urgent care for resp infection astmatic bronchitis a few weeks ago and rx with z pack and hydrocodone cough med and pred nisone duoneb AlbuterolLast dose of prednisone .  Off week of pred .  Doing much better but Not using phentermine yet. Though pred would make it a wasted trial  Hx of hysterectomy  No cancer  Says tubes and ovaries out  No hrt .  Was told needed pap every few years in past /. Last one in 2012 . ROS:  GEN/ HEENT: No fever,  Sleep an issue hot flushed also  headaches vision problems hearing changes,snores loudly but lives alone   CV/ PULM; No chest pain shortness of breath cough, syncope,edema  change in exercise tolerance. GI /GU: No adominal pain, vomiting, change in bowel habits. No blood in the stool. No significant GU symptoms. SKIN/HEME: ,no acute skin rashes suspicious lesions or bleeding. No lymphadenopathy, nodules, masses.  NEURO/ PSYCH:  No neurologic signs such as weakness numbness. No depression anxiety. IMM/ Allergy: No unusual infections.  Allergy .   REST of 12 system review negative except as per HPI  FAM HXSis and mom  Sleep apnea on cpcap Family hx strong for DM  Neg clotting personal hx  Hx of ocp use in remote past when younger  Past Medical History  Diagnosis Date  . Vertigo     intermittent infrequent  . Drug addiction     recovering  . Hx of varicella   . Substance abuse in remission 13 years ago     cocaine heroiin  met   support via [employer]  . Ex-smoker 2009  . History of fracture of wrist 17 years ago    has plate and pins right   . Hx of hysterectomy     bleeding and cysts    Family History  Problem Relation Age of Onset  . COPD Mother   . Diabetes Mother   . Hypertension Mother   . Sleep apnea Mother     and sister   . Thyroid disease Mother   . Chronic fatigue Mother   . Glaucoma Father   . Other  Father     prostate disease  . Osteoarthritis Mother     knee replacement   . Alcohol abuse      drug parent    History   Social History  . Marital Status: Single    Spouse Name: N/A    Number of Children: N/A  . Years of Education: N/A   Social History Main Topics  . Smoking status: Former Research scientist (life sciences)  . Smokeless tobacco: None  . Alcohol Use: Yes     Comment: socially  . Drug Use: Yes    Special: "Crack" cocaine, Heroin, Methamphetamines, Marijuana     Comment: History- 13 years clean  . Sexual Activity: None   Other Topics Concern  . 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   College education   Neg firearms    [place] of 1   Sleep about 5 hours    G2P1   Sees [facility]    Outpatient Encounter Prescriptions as of 11/17/2013  Medication Sig  . meclizine (ANTIVERT) 25 MG tablet Take 1 tablet (  25 mg total) by mouth 3 (three) times daily as needed for dizziness or nausea.  . [DISCONTINUED] chlorpheniramine-HYDROcodone (TUSSIONEX) 10-8 MG/5ML LQCR Take 5 mLs by mouth every 12 (twelve) hours as needed for cough.  . [DISCONTINUED] fluconazole (DIFLUCAN) 150 MG tablet Take 1 tablet (150 mg total) by mouth once.  . [DISCONTINUED] phentermine 15 MG capsule Take 1 capsule (15 mg total) by mouth every morning.  . [DISCONTINUED] predniSONE (DELTASONE) 20 MG tablet 3 daily for 5 days, 2 daily for 5 days, 1 daily for 5 days.  Marland Kitchen albuterol (PROVENTIL HFA;VENTOLIN HFA) 108 (90 BASE) MCG/ACT inhaler Inhale 2 puffs into the lungs 4 (four) times daily.  . fexofenadine-pseudoephedrine (ALLEGRA-D 24) 180-240 MG per 24 hr tablet Take 1 tablet by mouth daily.  . fluticasone (FLONASE) 50 MCG/ACT nasal spray 2 spray each nostril qd  . Olopatadine HCl 0.2 % SOLN Apply 1 drop to eye 1 day or 1 dose.  Marland Kitchen omeprazole (PRILOSEC) 20 MG capsule Take 1 capsule (20 mg total) by mouth daily.  . [DISCONTINUED]  amoxicillin-clavulanate (AUGMENTIN) 875-125 MG per tablet Take 1 tablet by mouth 2 (two) times daily.  . [DISCONTINUED] azithromycin (ZITHROMAX Z-PAK) 250 MG tablet Take as directed.  . [DISCONTINUED] metroNIDAZOLE (FLAGYL) 500 MG tablet Take 1 tablet (500 mg total) by mouth 2 (two) times daily.  . [DISCONTINUED] phentermine (ADIPEX-P) 37.5 MG tablet Take 1 tablet (37.5 mg total) by mouth daily before breakfast.    EXAM:  BP 110/74  Pulse 76  Temp(Src) 98 F (36.7 C) (Oral)  Ht 5' 1.75" (1.568 m)  Wt 255 lb (115.667 kg)  BMI 47.05 kg/m2  SpO2 98%  LMP 12/15/2011  Body mass index is 47.05 kg/(m^2).  Physical Exam: Vital signs reviewed MVH:QION is a well-developed well-nourished alert cooperative   female who appears her stated age in no acute distress.  HEENT: normocephalic atraumatic , Eyes: PERRL EOM's full, conjunctiva clear, Nares: paten,t no deformity discharge or tenderness., Ears: no deformity EAC's clear TMs with normal landmarks. Mouth: clear OP, no lesions, edema.  Moist mucous membranes.  Low lying palate Dentition in adequate repair. NECK: supple without masses, thyromegaly or bruits. CHEST/PULM:  Clear to auscultation and percussion breath sounds equal no wheeze , rales or rhonchi. No chest wall deformities or tenderness. Breast: normal by inspection . No dimpling, discharge, masses, tenderness or discharge . CV: PMI is nondisplaced, S1 S2 no gallops, murmurs, rubs. Peripheral pulses are full without delay.No JVD .  ABDOMEN: Bowel sounds normal nontender  No guard or rebound, no hepato splenomegal no CVA tenderness.  No hernia. Extremtities:  No clubbing cyanosis or edema, no acute joint swelling or redness no focal atrophy NEURO:  Oriented x3, cranial nerves 3-12 appear to be intact, no obvious focal weakness,gait within normal limits no abnormal reflexes or asymmetrical SKIN: No acute rashes normal turgor, color, no bruising or petechiae. PSYCH: Oriented, good eye  contact, no obvious depression anxiety, cognition and judgment appear normal. LN: no cervical axillary inguinal adenopathy Pelvic nl cx absent no  Masses noted   ASSESSMENT AND PLAN:  Discussed the following assessment and plan:  Visit for preventive health examination - Plan: Basic metabolic panel, CBC with Differential, Hemoglobin A1c, Hepatic function panel, Lipid panel, TSH, Follicle Stimulating Hormone  Hot flushes, perimenopausal - possible contributing to sleep issues as well as other factorscoinsdier hrt check fsh - Plan: Basic metabolic panel, CBC with Differential, Hemoglobin A1c, Hepatic function panel, Lipid panel, TSH, Follicle Stimulating Hormone  Snoring - Plan:  Basic metabolic panel, CBC with Differential, Hemoglobin A1c, Hepatic function panel, Lipid panel, TSH, Follicle Stimulating Hormone, Ambulatory referral to Pulmonology  Sleep disorder - concern ofr osa etc  - Plan: Basic metabolic panel, CBC with Differential, Hemoglobin A1c, Hepatic function panel, Lipid panel, TSH, Follicle Stimulating Hormone, Ambulatory referral to Pulmonology  Morbid obesity - Plan: Basic metabolic panel, CBC with Differential, Hemoglobin A1c, Hepatic function panel, Lipid panel, TSH, Follicle Stimulating Hormone  Family history of sleep apnea - Plan: Ambulatory referral to Pulmonology  Patient Care Team: Burnis Medin, MD as PCP - General (Internal Medicine) Patient Instructions  Get a mammogram 150 minutes of exercise weeks  ,  Lose weight  To healthy levels. Avoid trans fats and processed foods;  Increase fresh fruits and veges to 5 servings per day. And avoid sweet beverages  Including tea and juice. These are the best way to prevent diabetes  You could have sleep apnea with fam hx and snoring  Will arrange referral  To evaluate. Will notify you  of labs when available. We may have you begin on low dose hormones to see if helps sleep and hot flushes  After I get labs back. Patch may be  safer than pills.  dont need paps because you had hysterectomy.and no hx of cancer .  Preventive Care for Adults, Female A healthy lifestyle and preventive care can promote health and wellness. Preventive health guidelines for women include the following key practices.  A routine yearly physical is a good way to check with your health care provider about your health and preventive screening. It is a chance to share any concerns and updates on your health and to receive a thorough exam.  Visit your dentist for a routine exam and preventive care every 6 months. Brush your teeth twice a day and floss once a day. Good oral hygiene prevents tooth decay and gum disease.  The frequency of eye exams is based on your age, health, family medical history, use of contact lenses, and other factors. Follow your health care provider's recommendations for frequency of eye exams.  Eat a healthy diet. Foods like vegetables, fruits, whole grains, low-fat dairy products, and lean protein foods contain the nutrients you need without too many calories. Decrease your intake of foods high in solid fats, added sugars, and salt. Eat the right amount of calories for you.Get information about a proper diet from your health care provider, if necessary.  Regular physical exercise is one of the most important things you can do for your health. Most adults should get at least 150 minutes of moderate-intensity exercise (any activity that increases your heart rate and causes you to sweat) each week. In addition, most adults need muscle-strengthening exercises on 2 or more days a week.  Maintain a healthy weight. The body mass index (BMI) is a screening tool to identify possible weight problems. It provides an estimate of body fat based on height and weight. Your health care provider can find your BMI, and can help you achieve or maintain a healthy weight.For adults 20 years and older:  A BMI below 18.5 is considered  underweight.  A BMI of 18.5 to 24.9 is normal.  A BMI of 25 to 29.9 is considered overweight.  A BMI of 30 and above is considered obese.  Maintain normal blood lipids and cholesterol levels by exercising and minimizing your intake of saturated fat. Eat a balanced diet with plenty of fruit and vegetables. Blood tests for lipids and cholesterol  should begin at age 87 and be repeated every 5 years. If your lipid or cholesterol levels are high, you are over 50, or you are at high risk for heart disease, you may need your cholesterol levels checked more frequently.Ongoing high lipid and cholesterol levels should be treated with medicines if diet and exercise are not working.  If you smoke, find out from your health care provider how to quit. If you do not use tobacco, do not start.  Lung cancer screening is recommended for adults aged 76 80 years who are at high risk for developing lung cancer because of a history of smoking. A yearly low-dose CT scan of the lungs is recommended for people who have at least a 30-pack-year history of smoking and are a current smoker or have quit within the past 15 years. A pack year of smoking is smoking an average of 1 pack of cigarettes a day for 1 year (for example: 1 pack a day for 30 years or 2 packs a day for 15 years). Yearly screening should continue until the smoker has stopped smoking for at least 15 years. Yearly screening should be stopped for people who develop a health problem that would prevent them from having lung cancer treatment.  If you are pregnant, do not drink alcohol. If you are breastfeeding, be very cautious about drinking alcohol. If you are not pregnant and choose to drink alcohol, do not have more than 1 drink per day. One drink is considered to be 12 ounces (355 mL) of beer, 5 ounces (148 mL) of wine, or 1.5 ounces (44 mL) of liquor.  Avoid use of street drugs. Do not share needles with anyone. Ask for help if you need support or  instructions about stopping the use of drugs.  High blood pressure causes heart disease and increases the risk of stroke. Your blood pressure should be checked at least every 1 to 2 years. Ongoing high blood pressure should be treated with medicines if weight loss and exercise do not work.  If you are 40 48 years old, ask your health care provider if you should take aspirin to prevent strokes.  Diabetes screening involves taking a blood sample to check your fasting blood sugar level. This should be done once every 3 years, after age 34, if you are within normal weight and without risk factors for diabetes. Testing should be considered at a younger age or be carried out more frequently if you are overweight and have at least 1 risk factor for diabetes.  Breast cancer screening is essential preventive care for women. You should practice "breast self-awareness." This means understanding the normal appearance and feel of your breasts and may include breast self-examination. Any changes detected, no matter how small, should be reported to a health care provider. Women in their 70s and 30s should have a clinical breast exam (CBE) by a health care provider as part of a regular health exam every 1 to 3 years. After age 57, women should have a CBE every year. Starting at age 28, women should consider having a mammogram (breast X-ray test) every year. Women who have a family history of breast cancer should talk to their health care provider about genetic screening. Women at a high risk of breast cancer should talk to their health care providers about having an MRI and a mammogram every year.  Breast cancer gene (BRCA)-related cancer risk assessment is recommended for women who have family members with BRCA-related cancers. BRCA-related cancers include  breast, ovarian, tubal, and peritoneal cancers. Having family members with these cancers may be associated with an increased risk for harmful changes (mutations) in  the breast cancer genes BRCA1 and BRCA2. Results of the assessment will determine the need for genetic counseling and BRCA1 and BRCA2 testing.  The Pap test is a screening test for cervical cancer. A Pap test can show cell changes on the cervix that might become cervical cancer if left untreated. A Pap test is a procedure in which cells are obtained and examined from the lower end of the uterus (cervix).  Women should have a Pap test starting at age 36.  Between ages 80 and 76, Pap tests should be repeated every 2 years.  Beginning at age 62, you should have a Pap test every 3 years as long as the past 3 Pap tests have been normal.  Some women have medical problems that increase the chance of getting cervical cancer. Talk to your health care provider about these problems. It is especially important to talk to your health care provider if a new problem develops soon after your last Pap test. In these cases, your health care provider may recommend more frequent screening and Pap tests.  The above recommendations are the same for women who have or have not gotten the vaccine for human papillomavirus (HPV).  If you had a hysterectomy for a problem that was not cancer or a condition that could lead to cancer, then you no longer need Pap tests. Even if you no longer need a Pap test, a regular exam is a good idea to make sure no other problems are starting.  If you are between ages 78 and 62 years, and you have had normal Pap tests going back 10 years, you no longer need Pap tests. Even if you no longer need a Pap test, a regular exam is a good idea to make sure no other problems are starting.  If you have had past treatment for cervical cancer or a condition that could lead to cancer, you need Pap tests and screening for cancer for at least 20 years after your treatment.  If Pap tests have been discontinued, risk factors (such as a new sexual partner) need to be reassessed to determine if screening  should be resumed.  The HPV test is an additional test that may be used for cervical cancer screening. The HPV test looks for the virus that can cause the cell changes on the cervix. The cells collected during the Pap test can be tested for HPV. The HPV test could be used to screen women aged 19 years and older, and should be used in women of any age who have unclear Pap test results. After the age of 56, women should have HPV testing at the same frequency as a Pap test.  Colorectal cancer can be detected and often prevented. Most routine colorectal cancer screening begins at the age of 74 years and continues through age 10 years. However, your health care provider may recommend screening at an earlier age if you have risk factors for colon cancer. On a yearly basis, your health care provider may provide home test kits to check for hidden blood in the stool. Use of a small camera at the end of a tube, to directly examine the colon (sigmoidoscopy or colonoscopy), can detect the earliest forms of colorectal cancer. Talk to your health care provider about this at age 64, when routine screening begins. Direct exam of the colon should  be repeated every 5 10 years through age 22 years, unless early forms of pre-cancerous polyps or small growths are found.  People who are at an increased risk for hepatitis B should be screened for this virus. You are considered at high risk for hepatitis B if:  You were born in a country where hepatitis B occurs often. Talk with your health care provider about which countries are considered high risk.  Your parents were born in a high-risk country and you have not received a shot to protect against hepatitis B (hepatitis B vaccine).  You have HIV or AIDS.  You use needles to inject street drugs.  You live with, or have sex with, someone who has Hepatitis B.  You get hemodialysis treatment.  You take certain medicines for conditions like cancer, organ transplantation,  and autoimmune conditions.  Hepatitis C blood testing is recommended for all people born from [birth year] through 1965 and any individual with known risks for hepatitis C.  Practice safe sex. Use condoms and avoid high-risk sexual practices to reduce the spread of sexually transmitted infections (STIs). STIs include gonorrhea, chlamydia, syphilis, trichomonas, herpes, HPV, and human immunodeficiency virus (HIV). Herpes, HIV, and HPV are viral illnesses that have no cure. They can result in disability, cancer, and death. Sexually active women aged 16 years and younger should be checked for chlamydia. Older women with new or multiple partners should also be tested for chlamydia. Testing for other STIs is recommended if you are sexually active and at increased risk.  Osteoporosis is a disease in which the bones lose minerals and strength with aging. This can result in serious bone fractures or breaks. The risk of osteoporosis can be identified using a bone density scan. Women ages 64 years and over and women at risk for fractures or osteoporosis should discuss screening with their health care providers. Ask your health care provider whether you should take a calcium supplement or vitamin D to reduce the rate of osteoporosis.  Menopause can be associated with physical symptoms and risks. Hormone replacement therapy is available to decrease symptoms and risks. You should talk to your health care provider about whether hormone replacement therapy is right for you.  Use sunscreen. Apply sunscreen liberally and repeatedly throughout the day. You should seek shade when your shadow is shorter than you. Protect yourself by wearing long sleeves, pants, a wide-brimmed hat, and sunglasses year round, whenever you are outdoors.  Once a month, do a whole body skin exam, using a mirror to look at the skin on your back. Tell your health care provider of new moles, moles that have irregular borders, moles that are larger than a  pencil eraser, or moles that have changed in shape or color.  Stay current with required vaccines (immunizations).  Influenza vaccine. All adults should be immunized every year.  Tetanus, diphtheria, and acellular pertussis (Td, Tdap) vaccine. Pregnant women should receive 1 dose of Tdap vaccine during each pregnancy. The dose should be obtained regardless of the length of time since the last dose. Immunization is preferred during the 27th 36th week of gestation. An adult who has not previously received Tdap or who does not know her vaccine status should receive 1 dose of Tdap. This initial dose should be followed by tetanus and diphtheria toxoids (Td) booster doses every 10 years. Adults with an unknown or incomplete history of completing a 3-dose immunization series with Td-containing vaccines should begin or complete a primary immunization series including a Tdap dose. Adults  should receive a Td booster every 10 years.  Varicella vaccine. An adult without evidence of immunity to varicella should receive 2 doses or a second dose if she has previously received 1 dose. Pregnant females who do not have evidence of immunity should receive the first dose after pregnancy. This first dose should be obtained before leaving the health care facility. The second dose should be obtained 4 8 weeks after the first dose.  Human papillomavirus (HPV) vaccine. Females aged 72 26 years who have not received the vaccine previously should obtain the 3-dose series. The vaccine is not recommended for use in pregnant females. However, pregnancy testing is not needed before receiving a dose. If a female is found to be pregnant after receiving a dose, no treatment is needed. In that case, the remaining doses should be delayed until after the pregnancy. Immunization is recommended for any person with an immunocompromised condition through the age of 1 years if she did not get any or all doses earlier. During the 3-dose series,  the second dose should be obtained 4 8 weeks after the first dose. The third dose should be obtained 24 weeks after the first dose and 16 weeks after the second dose.  Zoster vaccine. One dose is recommended for adults aged 31 years or older unless certain conditions are present.  Measles, mumps, and rubella (MMR) vaccine. Adults born before 1 generally are considered immune to measles and mumps. Adults born in [birth year] or later should have 1 or more doses of MMR vaccine unless there is a contraindication to the vaccine or there is laboratory evidence of immunity to each of the three diseases. A routine second dose of MMR vaccine should be obtained at least 28 days after the first dose for students attending postsecondary schools, health care workers, or international travelers. People who received inactivated measles vaccine or an unknown type of measles vaccine during 1963 1967 should receive 2 doses of MMR vaccine. People who received inactivated mumps vaccine or an unknown type of mumps vaccine before 1979 and are at high risk for mumps infection should consider immunization with 2 doses of MMR vaccine. For females of childbearing age, rubella immunity should be determined. If there is no evidence of immunity, females who are not pregnant should be vaccinated. If there is no evidence of immunity, females who are pregnant should delay immunization until after pregnancy. Unvaccinated health care workers born before [birth year] who lack laboratory evidence of measles, mumps, or rubella immunity or laboratory confirmation of disease should consider measles and mumps immunization with 2 doses of MMR vaccine or rubella immunization with 1 dose of MMR vaccine.  Pneumococcal 13-valent conjugate (PCV13) vaccine. When indicated, a person who is uncertain of her immunization history and has no record of immunization should receive the PCV13 vaccine. An adult aged 45 years or older who has certain medical conditions and has  not been previously immunized should receive 1 dose of PCV13 vaccine. This PCV13 should be followed with a dose of pneumococcal polysaccharide (PPSV23) vaccine. The PPSV23 vaccine dose should be obtained at least 8 weeks after the dose of PCV13 vaccine. An adult aged 10 years or older who has certain medical conditions and previously received 1 or more doses of PPSV23 vaccine should receive 1 dose of PCV13. The PCV13 vaccine dose should be obtained 1 or more years after the last PPSV23 vaccine dose.  Pneumococcal polysaccharide (PPSV23) vaccine. When PCV13 is also indicated, PCV13 should be obtained first. All adults aged 44  years and older should be immunized. An adult younger than age 76 years who has certain medical conditions should be immunized. Any person who resides in a nursing home or long-term care facility should be immunized. An adult smoker should be immunized. People with an immunocompromised condition and certain other conditions should receive both PCV13 and PPSV23 vaccines. People with human immunodeficiency virus (HIV) infection should be immunized as soon as possible after diagnosis. Immunization during chemotherapy or radiation therapy should be avoided. Routine use of PPSV23 vaccine is not recommended for American Indians, [place]atives, or people younger than 65 years unless there are medical conditions that require PPSV23 vaccine. When indicated, people who have unknown immunization and have no record of immunization should receive PPSV23 vaccine. One-time revaccination 5 years after the first dose of PPSV23 is recommended for people aged 90 64 years who have chronic kidney failure, nephrotic syndrome, asplenia, or immunocompromised conditions. People who received 1 2 doses of PPSV23 before age 42 years should receive another dose of PPSV23 vaccine at age 94 years or later if at least 5 years have passed since the previous dose. Doses of PPSV23 are not needed for people immunized with  PPSV23 at or after age 41 years.  Meningococcal vaccine. Adults with asplenia or persistent complement component deficiencies should receive 2 doses of quadrivalent meningococcal conjugate (MenACWY-D) vaccine. The doses should be obtained at least 2 months apart. Microbiologists working with certain meningococcal bacteria, [place] recruits, people at risk during an outbreak, and people who travel to or live in countries with a high rate of meningitis should be immunized. A first-year college student up through age 43 years who is living in a residence hall should receive a dose if she did not receive a dose on or after her 16th birthday. Adults who have certain high-risk conditions should receive one or more doses of vaccine.  Hepatitis A vaccine. Adults who wish to be protected from this disease, have certain high-risk conditions, work with hepatitis A-infected animals, work in hepatitis A research labs, or travel to or work in countries with a high rate of hepatitis A should be immunized. Adults who were previously unvaccinated and who anticipate close contact with an international adoptee during the first 60 days after arrival in the [place] from a country with a high rate of hepatitis A should be immunized.  Hepatitis B vaccine. Adults who wish to be protected from this disease, have certain high-risk conditions, may be exposed to blood or other infectious body fluids, are household contacts or sex partners of hepatitis B positive people, are clients or workers in certain care facilities, or travel to or work in countries with a high rate of hepatitis B should be immunized.  Haemophilus influenzae type b (Hib) vaccine. A previously unvaccinated person with asplenia or sickle cell disease or having a scheduled splenectomy should receive 1 dose of Hib vaccine. Regardless of previous immunization, a recipient of a hematopoietic stem cell transplant should receive a 3-dose series 6 12 months after her  successful transplant. Hib vaccine is not recommended for adults with HIV infection. Preventive Services / Frequency Ages 47 to 39years  Blood pressure check.** / Every 1 to 2 years.  Lipid and cholesterol check.** / Every 5 years beginning at age 65.  Clinical breast exam.** / Every 3 years for women in their 16s and 79s.  BRCA-related cancer risk assessment.** / For women who have family members with a BRCA-related cancer (breast, ovarian, tubal, or peritoneal cancers).  Pap test.** / Every 2 years from ages 99 through 54. Every 3 years starting at age 85 through age 16 or 68 with a history of 3 consecutive normal Pap tests.  HPV screening.** / Every 3 years from ages 23 through ages 84 to 88 with a history of 3 consecutive normal Pap tests.  Hepatitis C blood test.** / For any individual with known risks for hepatitis C.  Skin self-exam. / Monthly.  Influenza vaccine. / Every year.  Tetanus, diphtheria, and acellular pertussis (Tdap, Td) vaccine.** / Consult your health care provider. Pregnant women should receive 1 dose of Tdap vaccine during each pregnancy. 1 dose of Td every 10 years.  Varicella vaccine.** / Consult your health care provider. Pregnant females who do not have evidence of immunity should receive the first dose after pregnancy.  HPV vaccine. / 3 doses over 6 months, if 30 and younger. The vaccine is not recommended for use in pregnant females. However, pregnancy testing is not needed before receiving a dose.  Measles, mumps, rubella (MMR) vaccine.** / You need at least 1 dose of MMR if you were born in 1957 or later. You may also need a 2nd dose. For females of childbearing age, rubella immunity should be determined. If there is no evidence of immunity, females who are not pregnant should be vaccinated. If there is no evidence of immunity, females who are pregnant should delay immunization until after pregnancy.  Pneumococcal 13-valent conjugate (PCV13) vaccine.** /  Consult your health care provider.  Pneumococcal polysaccharide (PPSV23) vaccine.** / 1 to 2 doses if you smoke cigarettes or if you have certain conditions.  Meningococcal vaccine.** / 1 dose if you are age 76 to 70 years and a Market researcher living in a residence hall, or have one of several medical conditions, you need to get vaccinated against meningococcal disease. You may also need additional booster doses.  Hepatitis A vaccine.** / Consult your health care provider.  Hepatitis B vaccine.** / Consult your health care provider.  Haemophilus influenzae type b (Hib) vaccine.** / Consult your health care provider. Ages 13 to 64years  Blood pressure check.** / Every 1 to 2 years.  Lipid and cholesterol check.** / Every 5 years beginning at age 24 years.  Lung cancer screening. / Every year if you are aged 42 80 years and have a 30-pack-year history of smoking and currently smoke or have quit within the past 15 years. Yearly screening is stopped once you have quit smoking for at least 15 years or develop a health problem that would prevent you from having lung cancer treatment.  Clinical breast exam.** / Every year after age 85 years.  BRCA-related cancer risk assessment.** / For women who have family members with a BRCA-related cancer (breast, ovarian, tubal, or peritoneal cancers).  Mammogram.** / Every year beginning at age 57 years and continuing for as long as you are in good health. Consult with your health care provider.  Pap test.** / Every 3 years starting at age 43 years through age 51 or 78 years with a history of 3 consecutive normal Pap tests.  HPV screening.** / Every 3 years from ages 69 years through ages 74 to 60 years with a history of 3 consecutive normal Pap tests.  Fecal occult blood test (FOBT) of stool. / Every year beginning at age 66 years and continuing until age 78 years. You may not need to do this test if you get a colonoscopy every 10  years.  Flexible  sigmoidoscopy or colonoscopy.** / Every 5 years for a flexible sigmoidoscopy or every 10 years for a colonoscopy beginning at age 29 years and continuing until age 28 years.  Hepatitis C blood test.** / For all people born from [birth year] through 1965 and any individual with known risks for hepatitis C.  Skin self-exam. / Monthly.  Influenza vaccine. / Every year.  Tetanus, diphtheria, and acellular pertussis (Tdap/Td) vaccine.** / Consult your health care provider. Pregnant women should receive 1 dose of Tdap vaccine during each pregnancy. 1 dose of Td every 10 years.  Varicella vaccine.** / Consult your health care provider. Pregnant females who do not have evidence of immunity should receive the first dose after pregnancy.  Zoster vaccine.** / 1 dose for adults aged 1 years or older.  Measles, mumps, rubella (MMR) vaccine.** / You need at least 1 dose of MMR if you were born in 1957 or later. You may also need a 2nd dose. For females of childbearing age, rubella immunity should be determined. If there is no evidence of immunity, females who are not pregnant should be vaccinated. If there is no evidence of immunity, females who are pregnant should delay immunization until after pregnancy.  Pneumococcal 13-valent conjugate (PCV13) vaccine.** / Consult your health care provider.  Pneumococcal polysaccharide (PPSV23) vaccine.** / 1 to 2 doses if you smoke cigarettes or if you have certain conditions.  Meningococcal vaccine.** / Consult your health care provider.  Hepatitis A vaccine.** / Consult your health care provider.  Hepatitis B vaccine.** / Consult your health care provider.  Haemophilus influenzae type b (Hib) vaccine.** / Consult your health care provider.     [name]. Panosh M.D.  Pre visit review using our clinic review tool, if applicable. No additional management support is needed unless otherwise documented below in the visit note.

## 2013-11-21 ENCOUNTER — Telehealth: Payer: Self-pay | Admitting: Internal Medicine

## 2013-11-21 ENCOUNTER — Encounter: Payer: Self-pay | Admitting: Pulmonary Disease

## 2013-11-21 ENCOUNTER — Other Ambulatory Visit: Payer: Self-pay | Admitting: Family Medicine

## 2013-11-21 ENCOUNTER — Ambulatory Visit (INDEPENDENT_AMBULATORY_CARE_PROVIDER_SITE_OTHER): Payer: 59 | Admitting: Pulmonary Disease

## 2013-11-21 VITALS — BP 132/70 | HR 82 | Temp 98.2°F | Ht 61.0 in | Wt 261.2 lb

## 2013-11-21 DIAGNOSIS — G479 Sleep disorder, unspecified: Secondary | ICD-10-CM

## 2013-11-21 MED ORDER — ESTRADIOL 0.075 MG/24HR TD PTTW: 1.0000 | MEDICATED_PATCH | TRANSDERMAL | Status: DC

## 2013-11-21 MED ORDER — PHENTERMINE HCL 15 MG PO CAPS: 15.0000 mg | ORAL_CAPSULE | ORAL | Status: DC

## 2013-11-21 NOTE — Telephone Encounter (Signed)
Patient notified of results by telephone.  See note.

## 2013-11-21 NOTE — Telephone Encounter (Signed)
Pt requesting lab results pt had labs done on 11/18/13.

## 2013-11-21 NOTE — Assessment & Plan Note (Addendum)
You likely have obstructive sleep apnea Home sleep study, she will likely need subsequent titration study in the lab  Given excessive daytime somnolence, narrow pharyngeal exam, witnessed apneas & loud snoring, obstructive sleep apnea is very likely & an overnight polysomnogram will be scheduled as a split study. The pathophysiology of obstructive sleep apnea , it's cardiovascular consequences & modes of treatment including CPAP were discused with the patient in detail & they evidenced understanding.

## 2013-11-21 NOTE — Patient Instructions (Signed)
You likely have obstructive sleep apnea  Home sleep study 

## 2013-11-21 NOTE — Progress Notes (Signed)
Subjective:    Patient ID: Monique Nelson, female    DOB: 1966/05/22, 48 y.o.   MRN: [id number]  HPI  48 year old obese woman presents for evaluation of obstructive sleep apnea and insomnia. Epworth sleepiness score is 16/24 and she report sleepiness in various situations such as watching TV, sitting inactive in a public place, as a passenger in a car or sitting quietly after lunch. She also reports difficulty maintaining sleep in spite of several sleep aids such as melatonin, sominex and Benadryl. Loud snoring has been noted by family members. Bedtime is around 9 to 10:30 PM, sleep latency is about 30 minutes or less, she sleeps on her side with 2 pillows, reports 4-5 awakenings sometimes lasting about 2 hours and is out of bed by 6 AM feeling tired with headaches that last up-to-late morning and occasional dryness of mouth. She is gained about 60 pounds in the last 2 years due to prednisone. There is no history suggestive of cataplexy, sleep paralysis or parasomnias She quit using drugs 14 years ago and quit smoking in 10/2013 after 30-pack-years.  Past Medical History  Diagnosis Date  . Vertigo     intermittent infrequent  . Drug addiction     recovering  . Hx of varicella   . Substance abuse in remission 13 years ago     cocaine heroiin  met   support via ADP  . Ex-smoker 2009  . History of fracture of wrist 17 years ago    has plate and pins right   . Hx of hysterectomy     bleeding and cysts    Past Surgical History  Procedure Laterality Date  . Rt hand surgery      17 years ago  . Ablastion    . Abdominal hysterectomy      No Known Allergies  History   Social History  . Marital Status: Single    Spouse Name: N/A    Number of Children: 1  . Years of Education: N/A   Occupational History  .  [employer]   Social History Main Topics  . Smoking status: Former Smoker -- 0.04 packs/day for 8 years    Types: Cigarettes    Start date: 10/21/2013  .  Smokeless tobacco: Not on file     Comment: off and on x 8 yrs  . Alcohol Use: No  . Drug Use: Yes    Special: "Crack" cocaine, Heroin, Methamphetamines, Marijuana     Comment: History- 14 years clean-11/21/13  . Sexual Activity: Not on file   Other Topics Concern  . 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   College education   Neg firearms    [place] of 1   Sleep about 5 hours    G2P1   Sees GYNE    Family History  Problem Relation Age of Onset  . COPD Mother   . Diabetes Mother   . Hypertension Mother   . Sleep apnea Mother     and sister   . Thyroid disease Mother   . Chronic fatigue Mother   . Glaucoma Father   . Other Father     prostate disease  . Osteoarthritis Mother     knee replacement   . Alcohol abuse      drug parent     Review of Systems  Constitutional: Positive for unexpected weight change. Negative for fever.  HENT: Negative for congestion, dental problem, ear pain, nosebleeds, postnasal drip, rhinorrhea, sinus pressure, sneezing, sore throat and trouble swallowing.   Eyes: Negative for redness and itching.  Respiratory: Positive for cough and shortness of breath. Negative for chest tightness and wheezing.   Cardiovascular: Negative for palpitations and leg swelling.  Gastrointestinal: Negative for nausea and vomiting.  Genitourinary: Negative for dysuria.  Musculoskeletal: Negative for joint swelling.  Skin: Negative for rash.  Neurological: Positive for headaches.  Hematological: Does not bruise/bleed easily.  Psychiatric/Behavioral: Negative for dysphoric mood. The patient is not nervous/anxious.        Objective:   Physical Exam  Gen. Pleasant, obese, in no distress, normal affect ENT - no lesions, no post nasal drip, class 2-3 airway Neck: No JVD, no thyromegaly, no carotid bruits Lungs: no use of accessory muscles, no dullness to  percussion, decreased without rales or rhonchi  Cardiovascular: Rhythm regular, heart sounds  normal, no murmurs or gallops, no peripheral edema Abdomen: soft and non-tender, no hepatosplenomegaly, BS normal. Musculoskeletal: No deformities, no cyanosis or clubbing Neuro:  alert, non focal, no tremors       Assessment & Plan:

## 2013-12-17 DIAGNOSIS — G4733 Obstructive sleep apnea (adult) (pediatric): Secondary | ICD-10-CM

## 2013-12-23 ENCOUNTER — Ambulatory Visit (INDEPENDENT_AMBULATORY_CARE_PROVIDER_SITE_OTHER): Payer: 59 | Admitting: Internal Medicine

## 2013-12-23 ENCOUNTER — Telehealth: Payer: Self-pay | Admitting: Pulmonary Disease

## 2013-12-23 ENCOUNTER — Encounter: Payer: Self-pay | Admitting: Pulmonary Disease

## 2013-12-23 ENCOUNTER — Encounter: Payer: Self-pay | Admitting: Internal Medicine

## 2013-12-23 VITALS — BP 132/72 | Temp 98.2°F | Ht 61.75 in | Wt 261.0 lb

## 2013-12-23 DIAGNOSIS — R7301 Impaired fasting glucose: Secondary | ICD-10-CM

## 2013-12-23 DIAGNOSIS — G4733 Obstructive sleep apnea (adult) (pediatric): Secondary | ICD-10-CM

## 2013-12-23 DIAGNOSIS — R7309 Other abnormal glucose: Secondary | ICD-10-CM

## 2013-12-23 DIAGNOSIS — N951 Menopausal and female climacteric states: Secondary | ICD-10-CM

## 2013-12-23 DIAGNOSIS — R7303 Prediabetes: Secondary | ICD-10-CM

## 2013-12-23 DIAGNOSIS — H1010 Acute atopic conjunctivitis, unspecified eye: Secondary | ICD-10-CM

## 2013-12-23 DIAGNOSIS — E876 Hypokalemia: Secondary | ICD-10-CM

## 2013-12-23 DIAGNOSIS — J309 Allergic rhinitis, unspecified: Secondary | ICD-10-CM

## 2013-12-23 LAB — BASIC METABOLIC PANEL
BUN: 14 mg/dL (ref 6–23)
CO2: 25 meq/L (ref 19–32)
Calcium: 8.9 mg/dL (ref 8.4–10.5)
Chloride: 107 mEq/L (ref 96–112)
Creatinine, Ser: 0.7 mg/dL (ref 0.4–1.2)
GFR: 118.74 mL/min (ref >60.00)
GLUCOSE: 112 mg/dL — AB (ref 70–99)
POTASSIUM: 3.6 meq/L (ref 3.5–5.1)
Sodium: 139 mEq/L (ref 135–145)

## 2013-12-23 MED ORDER — METFORMIN HCL ER 500 MG PO TB24: 500.0000 mg | ORAL_TABLET | Freq: Every day | ORAL | Status: DC

## 2013-12-23 MED ORDER — PHENTERMINE HCL 15 MG PO CAPS: 15.0000 mg | ORAL_CAPSULE | ORAL | Status: DC

## 2013-12-23 NOTE — Assessment & Plan Note (Signed)
Home study showed severe OSA - AHI 56/h To get titration study

## 2013-12-23 NOTE — Assessment & Plan Note (Signed)
Patch hrt helping

## 2013-12-23 NOTE — Assessment & Plan Note (Signed)
Metformin begun

## 2013-12-23 NOTE — Progress Notes (Signed)
Chief Complaint  Patient presents with  . Follow-up    hot flushes  obesity  bg     HPI: Patient comes in today for follow up of  multiple medical problems.     hot fl;ushes HRT begun etc  notas intense   Doing better with this   Dr Elsworth Soho says she has severe sleep apnea.   Sleepy a lot  Forgetful snores  Has planned fu   Bp ok .   Forgets to take phentermine and has only taken 15 has bottle   Allergy allegra  Flonase singulari   Eyes itch a lot today and red and watery  Has used Patanol in past but expensive   ROS: See pertinent positives and negatives per HPI. No cp fever sob   Past Medical History  Diagnosis Date  . Vertigo     intermittent infrequent  . Drug addiction     recovering  . Hx of varicella   . Substance abuse in remission 13 years ago     cocaine heroiin  met   support via ADP  . Ex-smoker 2009  . History of fracture of wrist 17 years ago    has plate and pins right   . Hx of hysterectomy     bleeding and cysts    Family History  Problem Relation Age of Onset  . COPD Mother   . Diabetes Mother   . Hypertension Mother   . Sleep apnea Mother     and sister   . Thyroid disease Mother   . Chronic fatigue Mother   . Glaucoma Father   . Other Father     prostate disease  . Osteoarthritis Mother     knee replacement   . Alcohol abuse      drug parent    History   Social History  . Marital Status: Single    Spouse Name: N/A    Number of Children: 1  . Years of Education: N/A   Occupational History  .  [employer]   Social History Main Topics  . Smoking status: Former Smoker -- 0.04 packs/day for 8 years    Types: Cigarettes    Start date: 10/21/2013  . Smokeless tobacco: None     Comment: off and on x 8 yrs  . Alcohol Use: No  . Drug Use: Yes    Special: "Crack" cocaine, Heroin, Methamphetamines, Marijuana     Comment: History- 14 years clean-11/21/13  . Sexual Activity: None   Other Topics Concern  . 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   College education   Neg firearms    [place] of 1   Sleep about 5 hours    G2P1   Sees GYNE    Outpatient Encounter Prescriptions as of 12/23/2013  Medication Sig  . albuterol (PROVENTIL HFA;VENTOLIN HFA) 108 (90 BASE) MCG/ACT inhaler Inhale 2 puffs into the lungs 4 (four) times daily.  Marland Kitchen estradiol (VIVELLE-DOT) 0.075 MG/24HR Place 1 patch onto the skin 2 (two) times a week.  . fexofenadine-pseudoephedrine (ALLEGRA-D 24) 180-240 MG per 24 hr tablet Take 1 tablet by mouth as needed.   . fluticasone (FLONASE) 50 MCG/ACT nasal spray as needed. 2 spray each nostril qd  . meclizine (ANTIVERT) 25 MG tablet Take 1 tablet (25 mg total) by mouth 3 (three) times daily as  needed for dizziness or nausea.  . Olopatadine HCl 0.2 % SOLN Apply 1 drop to eye as needed.  Marland Kitchen omeprazole (PRILOSEC) 20 MG capsule Take 20 mg by mouth as needed.  . metFORMIN (GLUCOPHAGE-XR) 500 MG 24 hr tablet Take 1 tablet (500 mg total) by mouth daily with breakfast.  . phentermine 15 MG capsule Take 1 capsule (15 mg total) by mouth every morning.  . [DISCONTINUED] phentermine 15 MG capsule Take 1 capsule (15 mg total) by mouth every morning.    EXAM:  BP 132/72  Temp(Src) 98.2 F (36.8 C) (Oral)  Ht 5' 1.75" (1.568 m)  Wt 261 lb (118.389 kg)  BMI 48.15 kg/m2  LMP 12/15/2011  Body mass index is 48.15 kg/(m^2).  GENERAL: vitals reviewed and listed above, alert, oriented, appears well hydrated and in no acute distress Eyes red watery itchy   Nares congested  MS: moves all extremities without noticeable focal  abnormality  PSYCH: pleasant and cooperative, no obvious depression or anxiety Lab Results  Component Value Date   WBC 15.4* 11/17/2013   HGB 12.4 11/17/2013   HCT 38.8 11/17/2013   PLT 391.0 11/17/2013   GLUCOSE 112* 12/23/2013   CHOL 190 11/17/2013   TRIG 127.0 11/17/2013   HDL 42.70 11/17/2013    LDLCALC 122* 11/17/2013   ALT 18 11/17/2013   AST 11 11/17/2013   NA 139 12/23/2013   K 3.6 12/23/2013   CL 107 12/23/2013   CREATININE 0.7 12/23/2013   BUN 14 12/23/2013   CO2 25 12/23/2013   TSH 1.14 11/17/2013   HGBA1C 6.6* 11/17/2013   Wt Readings from Last 3 Encounters:  12/23/13 261 lb (118.389 kg)  11/21/13 261 lb 3.2 oz (118.48 kg)  11/17/13 255 lb (115.667 kg)    ASSESSMENT AND PLAN:  Discussed the following assessment and plan:  Menopausal hot flushes - better on  hrt patch at this time   Hypokalemia - recheck today cramps better diet intervention - Plan: Basic metabolic panel  Fasting hyperglycemia - pre  vs early dm  add  metformin qd   Morbid obesity - prob sig osa to go under  rx evaluation has only taken 15 phentermine forgets bottle checked  continue and fu  Allergic conjunctivitis and rhinitis - check formulary or use otc   Prediabetes  OSA (obstructive sleep apnea) - severe on home study . to get titration study  -Patient advised to return or notify health care team  if symptoms worsen or persist or new concerns arise.  Patient Instructions  Get your sleep apnea evaluated and treated appropriately . Can help weight loss and energy level.  Can try otc   Eye drops for allergy also .  Stay on the hormone patch .  For now . Ok to use the phentermine. Potassium level today.  Begin metformin once a day. For blood sugar help.  Fu depending o nlabs   Or 1 month after continual phentermine .about 1-2 months       [name]. Dorien Bessent M.D.  Pre visit review using our clinic review tool, if applicable. No additional management support is needed unless otherwise documented below in the visit note. Total visit 3mns > 50% spent counseling and coordinating care

## 2013-12-23 NOTE — Patient Instructions (Addendum)
Get your sleep apnea evaluated and treated appropriately . Can help weight loss and energy level.  Can try otc   Eye drops for allergy also .  Stay on the hormone patch .  For now . Ok to use the phentermine. Potassium level today.  Begin metformin once a day. For blood sugar help.  Fu depending o nlabs   Or 1 month after continual phentermine .about 1-2 months

## 2013-12-23 NOTE — Telephone Encounter (Signed)
I spoke with patient about results and she verbalized understanding and had no questions 

## 2013-12-23 NOTE — Telephone Encounter (Signed)
Home study showed severe OSA - AHI 56/h schedule cpap titration study per our discussion

## 2013-12-23 NOTE — Telephone Encounter (Signed)
lmomtcb x1 for pt 

## 2014-01-26 ENCOUNTER — Ambulatory Visit (HOSPITAL_BASED_OUTPATIENT_CLINIC_OR_DEPARTMENT_OTHER): Payer: 59 | Attending: Pulmonary Disease

## 2014-01-26 VITALS — Ht 61.0 in | Wt 256.0 lb

## 2014-01-26 DIAGNOSIS — G4733 Obstructive sleep apnea (adult) (pediatric): Secondary | ICD-10-CM | POA: Insufficient documentation

## 2014-01-26 DIAGNOSIS — Z9989 Dependence on other enabling machines and devices: Secondary | ICD-10-CM

## 2014-01-28 ENCOUNTER — Ambulatory Visit (INDEPENDENT_AMBULATORY_CARE_PROVIDER_SITE_OTHER): Payer: 59 | Admitting: Endocrinology

## 2014-01-28 ENCOUNTER — Encounter: Payer: Self-pay | Admitting: Endocrinology

## 2014-01-28 ENCOUNTER — Other Ambulatory Visit: Payer: Self-pay | Admitting: *Deleted

## 2014-01-28 ENCOUNTER — Encounter: Payer: 59 | Attending: Endocrinology | Admitting: Nutrition

## 2014-01-28 ENCOUNTER — Telehealth: Payer: Self-pay | Admitting: Pulmonary Disease

## 2014-01-28 VITALS — BP 118/76 | HR 83 | Temp 97.7°F | Resp 16 | Ht 62.0 in | Wt 251.0 lb

## 2014-01-28 DIAGNOSIS — G4733 Obstructive sleep apnea (adult) (pediatric): Secondary | ICD-10-CM

## 2014-01-28 DIAGNOSIS — G4730 Sleep apnea, unspecified: Secondary | ICD-10-CM

## 2014-01-28 DIAGNOSIS — E119 Type 2 diabetes mellitus without complications: Secondary | ICD-10-CM

## 2014-01-28 DIAGNOSIS — Z713 Dietary counseling and surveillance: Secondary | ICD-10-CM | POA: Insufficient documentation

## 2014-01-28 DIAGNOSIS — R7303 Prediabetes: Secondary | ICD-10-CM

## 2014-01-28 DIAGNOSIS — G4710 Hypersomnia, unspecified: Secondary | ICD-10-CM

## 2014-01-28 DIAGNOSIS — R7309 Other abnormal glucose: Secondary | ICD-10-CM

## 2014-01-28 DIAGNOSIS — E7800 Pure hypercholesterolemia, unspecified: Secondary | ICD-10-CM

## 2014-01-28 LAB — GLUCOSE, POCT (MANUAL RESULT ENTRY): POC Glucose: 131 mg/dl — AB (ref 70–99)

## 2014-01-28 MED ORDER — ONETOUCH DELICA LANCETS FINE MISC: Status: DC

## 2014-01-28 MED ORDER — GLUCOSE BLOOD VI STRP: ORAL_STRIP | Status: DC

## 2014-01-28 MED ORDER — LORCASERIN HCL 10 MG PO TABS: 10.0000 mg | ORAL_TABLET | Freq: Two times a day (BID) | ORAL | Status: DC

## 2014-01-28 NOTE — Progress Notes (Signed)
Discussed the pathophysiology of diabetes, and the need for: balanced meals, wt loss, exercise.   Discussed the idea of balanced meals.  She has been on a diet since a special " fast" during lent, and has lost 10 pounds.  She is trying to maintain this diet.  Her meals are very high in protein and fat, and she knows this.  While she does not fry things, she uses much salad dressings, fat back in veg and her meat portions at supper are "very large".  Discussed porion control of meats and fats, like salad dressings, high fat meats like sausage, prime rib and rib eye steaks.  She was encouraged to limit her meat portions at supper to no more than 4 ounces.  She did not like this.  Dr. Lucianne MussKumar will be sending her to the dietitians upstairs for a more concise diet plan, and she knows this.  Stressed the need for exercise.  Strongly encouraged her to begin a walking program, and to work up to 30-40 min. 5 X/wk.  She reports feeling very tired and "has no energy", and does not think that she can begin this until she is feeling better.  I explained that she will begin to feel better immediately after walking.    She was given a One Touch Ultra meter and shown how to use it.  She re demonstrated  the procedure well.  Her blood sugar was 120 fasting.  She was encouraged to test before and 2hr. After one meal 4-5 days/wk, and to vary the meal each day.  Goals for premeal blood sugars: less than 120, and 2hr. pc meals: less than 170.  She reported good understanding of this.

## 2014-01-28 NOTE — Patient Instructions (Signed)
Walk for 30 min. 4-5 X /wk. Test blood sugar before and 2hr after one meal 4-5 days/wk.--varying the meals each day. Examine your feet daily for cuts and bruises.   Call if questions

## 2014-01-28 NOTE — Telephone Encounter (Signed)
I spoke with patient about results and she verbalized understanding and had no questions She will call us once she is set up for appt

## 2014-01-28 NOTE — Sleep Study (Signed)
[facility]  NAME: Monique DittoLetitia A Moxon  DATE OF BIRTH: Nov 19, 1965  MEDICAL RECORD NUMBER [id number]  LOCATION: [facility]  PHYSICIAN: Daesean Lazarz V.  DATE OF STUDY: 01/26/14   SLEEP STUDY TYPE: CPAP titration study               REFERRING PHYSICIAN: Oretha MilchAlva, Chaseton Yepiz V, MD  INDICATION FOR STUDY: 48 year old obese woman with excessive daytime somnolence and loud snoring . Home study showed severe OSA - AHI 56/h. At the time of this study ,they weighed 266 pounds with a height of  5 ft 0 inches and the BMI of 52, neck size of 14 inches. Epworth sleepiness score was 12   This CPAP titration polysomnogram was performed with a sleep technologist in attendance. EEG, EOG,EMG and respiratory parameters recorded. Sleep stages, arousals, limb movements and respiratory data was scored according to criteria laid out by the American Academy of sleep medicine.  SLEEP ARCHITECTURE: Lights out was at 2256 PM and lights on was at 518 AM. Total sleep time was 316 minutes with sleep period time of 335 minutes and sleep efficiency of 83% .Sleep latency was 46 minutes with latency to REM sleep of 53 minutes and wake after sleep onset of 18 minutes.  Sleep stages as a percentage of total sleep time was N1 -5.4 %,N2- 66.5 % and REM sleep 27 % ( 85 minutes) . The longest period of REM sleep was around 1 AM.   AROUSAL DATA : There were 40 arousals with an arousal index of 7.6 events per hour. Of these 13 were spontaneous, and 0 were associated with respiratory events and 27 were associated periodic limb movements  RESPIRATORY DATA: CPAP was initiated at 5 centimeters and titrated to a final level of 9 centimeters due to respiratory events and snoring. At the final level of 9 centimeters for 39 minutes, there were 0 obstructive apneas, 0 central apneas, 0 mixed apneas and 0 hypopneas with apnea -hypopnea index of 0 events per hour.  There was no relation to sleep stage or body  position. Titration was optimal.  MOVEMENT/PARASOMNIA: There were 45 PLMS with a PLM index of 8.5 events per hour. The PLM arousal index was 2.8 events per hour.  OXYGEN DATA: The lowest desaturation was 88 % during non-REM sleep and the desaturation index was 0 per hour. The saturations stayed below 88% for 0 minutes.  CARDIAC DATA: The low heart rate was 40 beats per minute. The high heart rate recorded was an artifact. No arrhythmias were noted  Discussion- CPAP titration was optimal. Supine and REM sleep was noted. She tolerated CPAP well with a small fullface mask.  IMPRESSION :  1. severe obstructive sleep apnea with hypopneas causing sleep fragmentation and mild oxygen desaturation. 2. This was corrected by CPAP of 9 centimeters with a small fullface mask. Titration was optimal. 3. No evidence of cardiac arrhythmias or behavioral disturbance during sleep. 4. Periodic limb movements were noted with few arousals. The significance is unclear.  RECOMMENDATION:    1. The treatment options for this degree of sleep disordered breathing includes weight loss and/or CPAP therapy. CPAP can be initiated at 9 centimeters with a small fullface mask and compliance monitored at this level. 2. Patient should be cautioned against driving when sleepy 3. They should be asked to avoid medications with sedative side effects  Oretha MilchALVA,Amiel Sharrow V.  MD Diplomate, American Board of Sleep Medicine  ELECTRONICALLY SIGNED ON: 01/28/2014  [facility] PH: [phone]   FX: [phone] [place]

## 2014-01-28 NOTE — Progress Notes (Signed)
Patient ID: Monique Nelson, female   DOB: 1966/04/08, 48 y.o.   MRN: [id number]    Reason for Appointment : Consultation for Type 2 Diabetes  History of Present Illness          Diagnosis: Type 2 diabetes mellitus, date of diagnosis: 2015       Past history: She has had impaired fasting glucose as per her record since about 2009 with the highest level being 110 Has not had any nutritional counseling or any specific management of this previously  Recent history:  On her routine exam her A1c was found to be 6.6 and she was told that she has diabetes. No prior A1c results available Her random glucose was 112 She was told to start metformin 500 mg daily Has not been started on home glucose monitoring and has not had a consultation with a dietitian The patient is concerned about her diagnosis and wanted more information as well as confirmation of what she was told On her own the last month or so she has been trying to watch her diet overall better and start some exercise She has been able to lose 10 pounds Random fasting glucose today is 131 in the office   Oral hypoglycemic drugs the patient is taking are: Metformin 500 mg daily      Side effects from medications have been: None  Glucose monitoring:  none  Glycemic control:  Lab Results  Component Value Date   HGBA1C 6.6* 11/17/2013   Lab Results  Component Value Date   LDLCALC 122* 11/17/2013   CREATININE 0.7 12/23/2013    Self-care: The diet that the patient has been following is: tries to limit fat intake; for breakfast is having egg whites or [place] sausage Usually having a salad or meat and vegetables at lunch, snacks will be fruit occasionally  Meals: 3 meals per day.          Exercise: 1/7 days a week         Dietician visit: Most recent:.               Compliance with the medical regimen:  Retinal exam: Most recent:.    Weight history: Wt Readings from Last 3 Encounters:  01/28/14 251 lb (113.853 kg)  01/26/14 256 lb  (116.121 kg)  12/23/13 261 lb (118.389 kg)      Medication List       This list is accurate as of: 01/28/14  9:32 AM.  Always use your most recent med list.               albuterol 108 (90 BASE) MCG/ACT inhaler  Commonly known as:  PROVENTIL HFA;VENTOLIN HFA  Inhale 2 puffs into the lungs 4 (four) times daily.     dicyclomine 20 MG tablet  Commonly known as:  BENTYL  Take 20 mg by mouth every 6 (six) hours.     estradiol 0.075 MG/24HR  Commonly known as:  VIVELLE-DOT  Place 1 patch onto the skin 2 (two) times a week.     fexofenadine-pseudoephedrine 180-240 MG per 24 hr tablet  Commonly known as:  ALLEGRA-D 24  Take 1 tablet by mouth as needed.     fluticasone 50 MCG/ACT nasal spray  Commonly known as:  FLONASE  as needed. 2 spray each nostril qd     meclizine 25 MG tablet  Commonly known as:  ANTIVERT  Take 25 mg by mouth 3 (three) times daily as needed for dizziness.  metFORMIN 500 MG 24 hr tablet  Commonly known as:  GLUCOPHAGE-XR  Take 1 tablet (500 mg total) by mouth daily with breakfast.     Olopatadine HCl 0.2 % Soln  Apply 1 drop to eye as needed.     omeprazole 20 MG capsule  Commonly known as:  PRILOSEC  Take 20 mg by mouth as needed.     phentermine 15 MG capsule  Take 1 capsule (15 mg total) by mouth every morning.        Allergies: No Known Allergies  Past Medical History  Diagnosis Date  . Vertigo     intermittent infrequent  . Drug addiction     recovering  . Hx of varicella   . Substance abuse in remission 13 years ago     cocaine heroiin  met   support via ADP  . Ex-smoker 2009  . History of fracture of wrist 17 years ago    has plate and pins right   . Hx of hysterectomy     bleeding and cysts    Past Surgical History  Procedure Laterality Date  . Rt hand surgery      17 years ago  . Ablastion    . Abdominal hysterectomy      Family History  Problem Relation Age of Onset  . COPD Mother   . Diabetes Mother   .  Hypertension Mother   . Sleep apnea Mother     and sister   . Thyroid disease Mother   . Chronic fatigue Mother   . Glaucoma Father   . Other Father     prostate disease  . Osteoarthritis Mother     knee replacement   . Alcohol abuse      drug parent    Social History:  reports that she has quit smoking. Her smoking use included Cigarettes. She started smoking about 3 months ago. She has a .32 pack-year smoking history. She does not have any smokeless tobacco history on file. She reports that she uses illicit drugs ("Crack" cocaine, Heroin, Methamphetamines, and Marijuana). She reports that she does not drink alcohol.    Review of Systems       Obesity: She has had difficulty losing weight. She has been prescribed phentermine 15 mg for this but she does not like to take this much because of constipation      Lipids: As follows, currently not on any statin drugs  Lab Results  Component Value Date   CHOL 190 11/17/2013   HDL 42.70 11/17/2013   LDLCALC 122* 11/17/2013   TRIG 127.0 11/17/2013   CHOLHDL 4 11/17/2013                  Skin: No rash or infections     Has history of allergic rhinitis     Thyroid:  No  unusual fatigue.     The blood pressure has bee normal without medications      No swelling of feet.     No shortness of breath on exertion. She has obstructive sleep apnea and is going to be started on CPAP     Bowel habits: Normal.      No joint  pains.         No history of Numbness or burning in  feet. May get tingling in her feet and legs on sitting for long   Top of feet are painful and tender especially left; has some more pain on walking and sometimes  throbbing sensation. Not much pain at night   LABS:  Office Visit on 01/28/2014  Component Date Value Ref Range Status  . POC Glucose 01/28/2014 131* 70 - 99 mg/dl Final    Physical Examination:  BP 118/76  Pulse 83  Temp(Src) 97.7 F (36.5 C)  Resp 16  Ht _0  (1.575 m)  Wt 251 lb (113.853 kg)   BMI 45.90 kg/m2  SpO2 97%  LMP 12/15/2011  GENERAL:   Patient has marked generalized obesity.   HEENT:         Eye exam shows normal external appearance. Fundus exam shows no retinopathy. Oral exam shows narrow oropharyngeal opening NECK:         General:  Neck exam shows no lymphadenopathy.  Thyroid is not enlarged and no nodules felt.   LUNGS:         Chest is symmetrical. Lungs are clear to auscultation.Marland Kitchen   HEART:         Heart sounds:  S1 and S2 are normal. No murmurs or clicks heard., no S3 or S4.   ABDOMEN:         General:  There is no distention present. Liver and spleen are not palpable. No other mass or tenderness present.  EXTREMITIES:     There is no edema. No skin lesions present.Marland Kitchen  NEUROLOGICAL:        Vibration sense is nearly normal  in toes. Ankle and biceps jerks are 1+ bilaterally.   She has sensitivity to touch on the dorsum of the left foot         Diabetic foot exam:  as in the foot exam section MUSCULOSKELETAL:       There is no enlargement or deformity of the joints. Spine is normal to inspection.Marland Kitchen   PEDAL pulses: Normal SKIN:       No rash. Has acanthosis of the neck    ASSESSMENT:  Diabetes type 2, mild and newly diagnosed She has had prediabetes since at least 2012 and now has an A1c of 6.6 This is confirmed by her fingerstick fasting glucose of 131 today in the office  Complications: None at present  Marked obesity. Has been unable to lose weight although doing somewhat better recently. Intolerant to phentermine  History of sleep apnea  Hypercholesterolemia, currently not on any statin drugs.  PLAN:   Have emphasized that her main focus of treatment is weight loss and although she has started losing some weight on her own she probably needs more help with this to achieve her goals  Currently she is not able to exercise much because of her fatigue, probably from untreated sleep apnea  She will see the diabetes educator today for basic diabetes  education as well as starting home glucose monitoring  Discussed frequency of glucose monitoring and blood sugar targets  Will confirm her fasting glucose level by lab also  Discussed symptoms of hyperglycemia and potential long-term effects of diabetes as well as preventive measures  Since she is intolerant of phentermine for weight loss she will try Belviq 10 mg twice a day to achieve weight loss. Discussed dosage and benefits as well as side effects and given brochure on this medication  She will try to increase her exercise regimen as much as possible  Consultation with dietitian for meal planning  Continue followup of lipids and consider a statin drug if she is not able to achieve LDL goal of less than 100 with weight loss   Elayne Snare  01/28/2014, 9:32 AM

## 2014-01-28 NOTE — Telephone Encounter (Signed)
CPAP can be initiated at 9 centimeters with a small fullface mask and compliance monitored at this level. Order sent to dme Needs FU in 

## 2014-01-28 NOTE — Patient Instructions (Addendum)
Please check blood sugars at least half the time about 2 hours after any meal and 2x weekly on waking up. Please bring blood sugar monitor to each visit  Stop Metformin

## 2014-01-29 ENCOUNTER — Other Ambulatory Visit: Payer: 59

## 2014-01-29 ENCOUNTER — Other Ambulatory Visit: Payer: Self-pay | Admitting: *Deleted

## 2014-01-29 DIAGNOSIS — R7303 Prediabetes: Secondary | ICD-10-CM

## 2014-01-30 LAB — GLUCOSE, RANDOM: Glucose, Bld: 93 mg/dL (ref 70–99)

## 2014-02-02 ENCOUNTER — Other Ambulatory Visit: Payer: Self-pay | Admitting: *Deleted

## 2014-02-02 ENCOUNTER — Telehealth: Payer: Self-pay | Admitting: *Deleted

## 2014-02-02 DIAGNOSIS — R7303 Prediabetes: Secondary | ICD-10-CM

## 2014-02-02 NOTE — Telephone Encounter (Signed)
Patient was asking for test results?

## 2014-02-02 NOTE — Telephone Encounter (Signed)
Glucose ok 93 no other labs done  Pl add lipid for next time

## 2014-02-24 ENCOUNTER — Ambulatory Visit (INDEPENDENT_AMBULATORY_CARE_PROVIDER_SITE_OTHER): Payer: 59 | Admitting: Internal Medicine

## 2014-02-24 ENCOUNTER — Encounter: Payer: Self-pay | Admitting: Internal Medicine

## 2014-02-24 DIAGNOSIS — G4733 Obstructive sleep apnea (adult) (pediatric): Secondary | ICD-10-CM

## 2014-02-24 DIAGNOSIS — R7309 Other abnormal glucose: Secondary | ICD-10-CM

## 2014-02-24 DIAGNOSIS — N951 Menopausal and female climacteric states: Secondary | ICD-10-CM

## 2014-02-24 DIAGNOSIS — R7303 Prediabetes: Secondary | ICD-10-CM

## 2014-02-24 LAB — GLUCOSE, POCT (MANUAL RESULT ENTRY): POC Glucose: 98 mg/dl (ref 70–99)

## 2014-02-24 MED ORDER — PHENTERMINE HCL 37.5 MG PO CAPS: 37.5000 mg | ORAL_CAPSULE | ORAL | Status: DC

## 2014-02-24 MED ORDER — ESTRADIOL 1 MG PO TABS: 1.0000 mg | ORAL_TABLET | Freq: Every day | ORAL | Status: DC

## 2014-02-24 NOTE — Progress Notes (Signed)
Chief Complaint  Patient presents with  . Follow-up    Would like to restart phentermine.  Currently not on Metformin.  Would like to try an oral estrogen.    HPI: Monique Nelson  comes in today for follow up of  multiple medical problems.   Metformin dced by endo and triying belvique but cost too much 600$ and cant afford wants to try phentermine again  Would like tory oral estorgen   Patch helpful  For flushes but  Cant remember to change and apply etc.   Saw DM educator and this was helpful has a follow up.   Dx with sever OSA has to pay for machine  Per Monique Nelson before can get this ;working on finances  ROS: See pertinent positives and negatives per HPI.no cp sob new sx lost some weight   Past Medical History  Diagnosis Date  . Vertigo     intermittent infrequent  . Drug addiction     recovering  . Hx of varicella   . Substance abuse in remission 13 years ago     cocaine heroiin  met   support via ADP  . Ex-smoker 2009  . History of fracture of wrist 17 years ago    has plate and pins right   . Hx of hysterectomy     bleeding and cysts    Family History  Problem Relation Age of Onset  . COPD Mother   . Diabetes Mother   . Hypertension Mother   . Sleep apnea Mother     and sister   . Thyroid disease Mother   . Chronic fatigue Mother   . Glaucoma Father   . Other Father     prostate disease  . Osteoarthritis Mother     knee replacement   . Alcohol abuse      drug parent    History   Social History  . Marital Status: Single    Spouse Name: N/A    Number of Children: 1  . Years of Education: N/A   Occupational History  .  [employer]   Social History Main Topics  . Smoking status: Former Smoker -- 0.04 packs/day for 8 years    Types: Cigarettes    Start date: 10/21/2013  . Smokeless tobacco: None     Comment: off and on x 8 yrs  . Alcohol Use: No  . Drug Use: Yes    Special: "Crack" cocaine, Heroin, Methamphetamines, Marijuana   Comment: History- 14 years clean-11/21/13  . Sexual Activity: None   Other Topics Concern  . 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   College education   Neg firearms    [place] of 1   Sleep about 5 hours    G2P1   Sees [facility]    Outpatient Encounter Prescriptions as of 02/24/2014  Medication Sig  . albuterol (PROVENTIL HFA;VENTOLIN HFA) 108 (90 BASE) MCG/ACT inhaler Inhale 2 puffs into the lungs 4 (four) times daily.  Marland Kitchen dicyclomine (BENTYL) 20 MG tablet Take 20 mg by mouth every 6 (six) hours.  . fexofenadine-pseudoephedrine (ALLEGRA-D 24) 180-240 MG per 24 hr tablet Take 1 tablet by mouth as needed.   . fluticasone (FLONASE) 50 MCG/ACT nasal spray as needed. 2 spray each nostril qd  . glucose blood (ONE TOUCH ULTRA TEST) test strip Use as instructed to  check blood sugar once a day alternating meals dx code 790.29  . meclizine (ANTIVERT) 25 MG tablet Take 25 mg by mouth 3 (three) times daily as needed for dizziness.  . Olopatadine HCl 0.2 % SOLN Apply 1 drop to eye as needed.  Marland Kitchen omeprazole (PRILOSEC) 20 MG capsule Take 20 mg by mouth as needed.  Glory Rosebush DELICA LANCETS FINE MISC Use to obtain a blood specimen once a day dx code 790.29  . [DISCONTINUED] estradiol (VIVELLE-DOT) 0.075 MG/24HR Place 1 patch onto the skin 2 (two) times a week.  . estradiol (ESTRACE) 1 MG tablet Take 1 tablet (1 mg total) by mouth daily.  . metFORMIN (GLUCOPHAGE-XR) 500 MG 24 hr tablet Take 1 tablet (500 mg total) by mouth daily with breakfast.  . phentermine 37.5 MG capsule Take 1 capsule (37.5 mg total) by mouth every morning.  . [DISCONTINUED] Lorcaserin HCl (BELVIQ) 10 MG TABS Take 10 mg by mouth 2 (two) times daily.  . [DISCONTINUED] phentermine 15 MG capsule Take 1 capsule (15 mg total) by mouth every morning.    EXAM:  BP 138/78  Pulse 68  Temp(Src) 98.6 F (37 C) (Oral)  Ht 5' 1.75" (1.568 m)   Wt 249 lb (112.946 kg)  BMI 45.94 kg/m2  SpO2 98%  LMP 12/15/2011  Body mass index is 45.94 kg/(m^2).  GENERAL: vitals reviewed and listed above, alert, oriented, appears well hydrated and in no acute distress HEENT: atraumatic, conjunctiva  clear, no obvious abnormalities on inspection of external nose and ears NECK: no obvious masses on inspection palpation  LUNGS: clear to auscultation bilaterally, no wheezes, rales or rhonchi, good air movement CV: HRRR, no clubbing cyanosis or  peripheral edema nl cap refill  MS: moves all extremities without noticeable focal  abnormality PSYCH: pleasant and cooperative, no obvious depression or anxiety Reviewed record  ASSESSMENT AND PLAN:  Discussed the following assessment and plan:  Morbid obesity  Menopausal hot flushes  Prediabetes - Plan: POC Glucose (CBG)  Hot flushes, perimenopausal  OSA (obstructive sleep apnea) Cost of meds will try 37.5 phentermine told pt of controlled substance category   And follow up.  Ok to try oral est  Risk benefit of medication discussed. Continue with endocrine FU  random cbg good today -Patient advised to return or notify health care team  if symptoms worsen ,persist or new concerns arise.  Patient Instructions  Phentermine trial at higher dose . Ok to try oral estrogen slightly higher risk of clotting. Proceed with   Sleep apnea intervention. return office visit in 1 month  For weight check etc.    Monique Nelson. Panosh M.D.   Pre visit review using our clinic review tool, if applicable. No additional management support is needed unless otherwise documented below in the visit note.

## 2014-02-24 NOTE — Patient Instructions (Signed)
Phentermine trial at higher dose . Ok to try oral estrogen slightly higher risk of clotting. Proceed with   Sleep apnea intervention. return office visit in 1 month  For weight check etc.

## 2014-02-27 ENCOUNTER — Telehealth: Payer: Self-pay | Admitting: *Deleted

## 2014-02-27 ENCOUNTER — Other Ambulatory Visit (INDEPENDENT_AMBULATORY_CARE_PROVIDER_SITE_OTHER): Payer: 59

## 2014-02-27 DIAGNOSIS — R7309 Other abnormal glucose: Secondary | ICD-10-CM

## 2014-02-27 DIAGNOSIS — E119 Type 2 diabetes mellitus without complications: Secondary | ICD-10-CM

## 2014-02-27 DIAGNOSIS — R7303 Prediabetes: Secondary | ICD-10-CM

## 2014-02-27 LAB — LIPID PANEL
CHOLESTEROL: 179 mg/dL (ref 0–200)
HDL: 32.4 mg/dL — AB (ref >39.00)
LDL CALC: 122 mg/dL — AB (ref 0–99)
TRIGLYCERIDES: 125 mg/dL (ref 0.0–149.0)
Total CHOL/HDL Ratio: 6
VLDL: 25 mg/dL (ref 0.0–40.0)

## 2014-02-27 LAB — BASIC METABOLIC PANEL
BUN: 12 mg/dL (ref 6–23)
CALCIUM: 9.4 mg/dL (ref 8.4–10.5)
CHLORIDE: 108 meq/L (ref 96–112)
CO2: 23 meq/L (ref 19–32)
Creatinine, Ser: 0.8 mg/dL (ref 0.4–1.2)
GFR: 104.36 mL/min (ref >60.00)
Glucose, Bld: 105 mg/dL — ABNORMAL HIGH (ref 70–99)
Potassium: 3.5 mEq/L (ref 3.5–5.1)
SODIUM: 139 meq/L (ref 135–145)

## 2014-02-27 NOTE — Telephone Encounter (Signed)
Needs to be scheduled with Bonita QuinLinda for starting this

## 2014-02-27 NOTE — Telephone Encounter (Signed)
noted 

## 2014-02-27 NOTE — Telephone Encounter (Signed)
Patient is unable to afford the Belviq, she said it's over $600 a month, she said you told her about Victoza and wants to know if you can switch her to that?  She does have an appt scheduled with you for the 20th.  Please advise

## 2014-03-03 ENCOUNTER — Other Ambulatory Visit: Payer: Self-pay | Admitting: Endocrinology

## 2014-03-03 DIAGNOSIS — E119 Type 2 diabetes mellitus without complications: Secondary | ICD-10-CM

## 2014-03-04 ENCOUNTER — Other Ambulatory Visit: Payer: Self-pay | Admitting: *Deleted

## 2014-03-04 ENCOUNTER — Encounter: Payer: Self-pay | Admitting: Endocrinology

## 2014-03-04 ENCOUNTER — Encounter: Payer: 59 | Attending: Endocrinology | Admitting: Nutrition

## 2014-03-04 ENCOUNTER — Ambulatory Visit (INDEPENDENT_AMBULATORY_CARE_PROVIDER_SITE_OTHER): Payer: 59 | Admitting: Endocrinology

## 2014-03-04 VITALS — BP 116/84 | HR 82 | Temp 97.2°F | Ht 62.0 in | Wt 247.0 lb

## 2014-03-04 DIAGNOSIS — E119 Type 2 diabetes mellitus without complications: Secondary | ICD-10-CM | POA: Insufficient documentation

## 2014-03-04 DIAGNOSIS — Z713 Dietary counseling and surveillance: Secondary | ICD-10-CM | POA: Insufficient documentation

## 2014-03-04 LAB — FRUCTOSAMINE: Fructosamine: 214 umol/L (ref 190–270)

## 2014-03-04 MED ORDER — INSULIN PEN NEEDLE 32G X 4 MM MISC: Status: DC

## 2014-03-04 MED ORDER — LIRAGLUTIDE 18 MG/3ML ~~LOC~~ SOPN: PEN_INJECTOR | SUBCUTANEOUS | Status: DC

## 2014-03-04 NOTE — Progress Notes (Signed)
This patient was shown how to use the Victoza pen.  She was given a starter kit with directions for pen use, and directions for increasing the dose from 0.6 to 1.2 after 7 days if no nausea.  We reviewed this dosage instructions, and she reported good understanding of this.  She had no final questions.

## 2014-03-04 NOTE — Progress Notes (Signed)
Patient ID: Monique Nelson, female   DOB: 03-31-1966, 48 y.o.   MRN: [id number]    Reason for Appointment : Followup for Type 2 Diabetes  History of Present Illness          Diagnosis: Type 2 diabetes mellitus, date of diagnosis: 2015       Past history: She has had impaired fasting glucose as per her record since about 2009 with the highest level being 110 Has not had any nutritional counseling or any specific management of this previously  Recent history:  On her routine exam her A1c was found to be 6.6 and random glucose was 112 She was given metformin 500 mg by her PCP Since she had been able to lose weight with lifestyle changes on her own she wanted to try stopping her metformin She has not seen a dietitian yet Has been started on glucose monitoring at home and blood sugars are only occasionally high She has lost only a little weight since her last visit Her insurance does not pay for Belviq and she is not able to tolerate phentermine   Oral hypoglycemic drugs the patient is taking are: none    Side effects from medications have been: None  Glucose monitoring with One Touch ultra:  fasting readings: 115 111, 90, 132; nonfasting 105, 130  Glycemic control:  Lab Results  Component Value Date   HGBA1C 6.6* 11/17/2013   Lab Results  Component Value Date   LDLCALC 122* 02/27/2014   CREATININE 0.8 02/27/2014    Self-care: The diet that the patient has been following is: tries to limit fat intake; for breakfast is having egg whites or [place] sausage Usually having a salad or meat and vegetables at lunch, snacks will be fruit occasionally  Meals: 3 meals per day.          Exercise: 5/7 days a week         Dietician visit: Most recent:.               Compliance with the medical regimen:  Retinal exam: Most recent:.    Weight history: Wt Readings from Last 3 Encounters:  03/04/14 247 lb (112.038 kg)  02/24/14 249 lb (112.946 kg)  01/28/14 251 lb (113.853 kg)       Medication List       This list is accurate as of: 03/04/14  1:53 PM.  Always use your most recent med list.               albuterol 108 (90 BASE) MCG/ACT inhaler  Commonly known as:  PROVENTIL HFA;VENTOLIN HFA  Inhale 2 puffs into the lungs 4 (four) times daily.     dicyclomine 20 MG tablet  Commonly known as:  BENTYL  Take 20 mg by mouth every 6 (six) hours.     estradiol 1 MG tablet  Commonly known as:  ESTRACE  Take 1 tablet (1 mg total) by mouth daily.     fexofenadine-pseudoephedrine 180-240 MG per 24 hr tablet  Commonly known as:  ALLEGRA-D 24  Take 1 tablet by mouth as needed.     fluticasone 50 MCG/ACT nasal spray  Commonly known as:  FLONASE  as needed. 2 spray each nostril qd     glucose blood test strip  Commonly known as:  ONE TOUCH ULTRA TEST  Use as instructed to check blood sugar once a day alternating meals dx code 790.29     Insulin Pen Needle 32G X 4 MM Misc  Commonly known as:  BD PEN NEEDLE NANO U/F  Use one pen needle per day with Victoza     Liraglutide 18 MG/3ML Sopn  Commonly known as:  VICTOZA  Inject 0.6 mg daily for 7 days, then if no nausea increase to 1.2 mg daily     meclizine 25 MG tablet  Commonly known as:  ANTIVERT  Take 25 mg by mouth 3 (three) times daily as needed for dizziness.     metFORMIN 500 MG 24 hr tablet  Commonly known as:  GLUCOPHAGE-XR  Take 1 tablet (500 mg total) by mouth daily with breakfast.     Olopatadine HCl 0.2 % Soln  Apply 1 drop to eye as needed.     omeprazole 20 MG capsule  Commonly known as:  PRILOSEC  Take 20 mg by mouth as needed.     ONETOUCH DELICA LANCETS FINE Misc  Use to obtain a blood specimen once a day dx code 790.29        Allergies: No Known Allergies  Past Medical History  Diagnosis Date  . Vertigo     intermittent infrequent  . Drug addiction     recovering  . Hx of varicella   . Substance abuse in remission 13 years ago     cocaine heroiin  met   support via ADP  .  Ex-smoker 2009  . History of fracture of wrist 17 years ago    has plate and pins right   . Hx of hysterectomy     bleeding and cysts    Past Surgical History  Procedure Laterality Date  . Rt hand surgery      17 years ago  . Ablastion    . Abdominal hysterectomy      Family History  Problem Relation Age of Onset  . COPD Mother   . Diabetes Mother   . Hypertension Mother   . Sleep apnea Mother     and sister   . Thyroid disease Mother   . Chronic fatigue Mother   . Glaucoma Father   . Other Father     prostate disease  . Osteoarthritis Mother     knee replacement   . Alcohol abuse      drug parent    Social History:  reports that she has quit smoking. Her smoking use included Cigarettes. She started smoking about 4 months ago. She has a .32 pack-year smoking history. She does not have any smokeless tobacco history on file. She reports that she uses illicit drugs ("Crack" cocaine, Heroin, Methamphetamines, and Marijuana). She reports that she does not drink alcohol.    Review of Systems       Lipids: As follows, currently not on any statin drugs, LDL has been stable over the last couple of years   Lab Results  Component Value Date   CHOL 179 02/27/2014   HDL 32.40* 02/27/2014   LDLCALC 122* 02/27/2014   TRIG 125.0 02/27/2014   CHOLHDL 6 02/27/2014              She has known sleep apnea   LABS:  Appointment on 02/27/2014  Component Date Value Ref Range Status  . Sodium 02/27/2014 139  135 - 145 mEq/L Final  . Potassium 02/27/2014 3.5  3.5 - 5.1 mEq/L Final  . Chloride 02/27/2014 108  96 - 112 mEq/L Final  . CO2 02/27/2014 23  19 - 32 mEq/L Final  . Glucose, Bld 02/27/2014 105* 70 - 99 mg/dL Final  .  BUN 02/27/2014 12  6 - 23 mg/dL Final  . Creatinine, Ser 02/27/2014 0.8  0.4 - 1.2 mg/dL Final  . Calcium 02/27/2014 9.4  8.4 - 10.5 mg/dL Final  . GFR 02/27/2014 104.36  >60.00 mL/min Final  . Cholesterol 02/27/2014 179  0 - 200 mg/dL Final   ATP III  Classification       Desirable:  < 200 mg/dL               Borderline High:  200 - 239 mg/dL          High:  > = 240 mg/dL  . Triglycerides 02/27/2014 125.0  0.0 - 149.0 mg/dL Final   Normal:  <150 mg/dLBorderline High:  150 - 199 mg/dL  . HDL 02/27/2014 32.40* >39.00 mg/dL Final  . VLDL 02/27/2014 25.0  0.0 - 40.0 mg/dL Final  . LDL Cholesterol 02/27/2014 122* 0 - 99 mg/dL Final  . Total CHOL/HDL Ratio 02/27/2014 6   Final                  Men          Women1/2 Average Risk     3.4          3.3Average Risk          5.0          4.42X Average Risk          9.6          7.13X Average Risk          15.0          11.0                        Physical Examination:  BP 116/84  Pulse 82  Temp(Src) 97.2 F (36.2 C) (Oral)  Ht $R'5\' 2"'rj$  (1.575 m)  Wt 247 lb (112.038 kg)  BMI 45.17 kg/m2  SpO2 97%  LMP 12/15/2011     ASSESSMENT/PLAN:   Diabetes type 2, mild and recently diagnosed Probably also has metabolic syndrome with low HDL and acanthosis on her exam She has only mild increase in her glucose levels at home and is doing better with her exercise regimen She does not want to try metformin as yet which may potentially help her with weight loss May consider this if her A1c is not improved in 2 months She will be scheduled to see a dietitian   Elayne Snare 03/04/2014, 1:53 PM

## 2014-03-04 NOTE — Patient Instructions (Signed)
Start Victoza 0.6 once daily. After 7 days, if no nausea, increase the dose to 1.2.   Read over information in starter kit and call if questions

## 2014-05-05 ENCOUNTER — Ambulatory Visit (INDEPENDENT_AMBULATORY_CARE_PROVIDER_SITE_OTHER): Payer: 59 | Admitting: Internal Medicine

## 2014-05-05 ENCOUNTER — Encounter: Payer: Self-pay | Admitting: Internal Medicine

## 2014-05-05 VITALS — BP 114/80 | Temp 98.8°F | Ht 61.75 in | Wt 247.0 lb

## 2014-05-05 DIAGNOSIS — R7309 Other abnormal glucose: Secondary | ICD-10-CM

## 2014-05-05 DIAGNOSIS — N951 Menopausal and female climacteric states: Secondary | ICD-10-CM

## 2014-05-05 DIAGNOSIS — R7303 Prediabetes: Secondary | ICD-10-CM

## 2014-05-05 DIAGNOSIS — L578 Other skin changes due to chronic exposure to nonionizing radiation: Secondary | ICD-10-CM

## 2014-05-05 MED ORDER — ESTRADIOL 1 MG PO TABS: 1.0000 mg | ORAL_TABLET | Freq: Every day | ORAL | Status: DC

## 2014-05-05 NOTE — Patient Instructions (Signed)
Continue with dr Lucianne MussKumar   considier metformin also if he advises this   m[facility] : [facility] / Or  [facility]  Continue on the estrogen.    It seems to be helping you. The rash seems solar related  So stay  out of direct sun and can try hydrocortisone otc for a week or so .   Preventive visit next February  Or thereabouts

## 2014-05-05 NOTE — Progress Notes (Signed)
Pre visit review using our clinic review tool, if applicable. No additional management support is needed unless otherwise documented below in the visit note.  Chief Complaint  Patient presents with  . Follow-up    hrt etc    HPI:  Fu weight  and hormoneal therapy  estrogen rx has helped  flushes are a lot better  .  Sleep is better with this.  Saw Dr Dwyane Dee and working on  Blood sugar and weight With the diabetes medication   Wanted to  Decide later agout weigh loss meds  bg  About 179    13o and 180 on victoza some dec appetite no nausea at this time  Has fu visit this week .  Has seen diabetes educator ROS: See pertinent positives and negatives per HPI. midly ithcy rash on forearms this week  Was out in sun no other contact no new meds  Past Medical History  Diagnosis Date  . Vertigo     intermittent infrequent  . Drug addiction     recovering  . Hx of varicella   . Substance abuse in remission 13 years ago     cocaine heroiin  met   support via ADP  . Ex-smoker 2009  . History of fracture of wrist 17 years ago    has plate and pins right   . Hx of hysterectomy     bleeding and cysts    Family History  Problem Relation Age of Onset  . COPD Mother   . Diabetes Mother   . Hypertension Mother   . Sleep apnea Mother     and sister   . Thyroid disease Mother   . Chronic fatigue Mother   . Glaucoma Father   . Other Father     prostate disease  . Osteoarthritis Mother     knee replacement   . Alcohol abuse      drug parent    History   Social History  . Marital Status: Single    Spouse Name: N/A    Number of Children: 1  . Years of Education: N/A   Occupational History  .  [employer]   Social History Main Topics  . Smoking status: Former Smoker -- 0.04 packs/day for 8 years    Types: Cigarettes    Start date: 10/21/2013  . Smokeless tobacco: None     Comment: off and on x 8 yrs  . Alcohol Use: No  . Drug Use: Yes    Special: "Crack" cocaine,  Heroin, Methamphetamines, Marijuana     Comment: History- 14 years clean-11/21/13  . Sexual Activity: None   Other Topics Concern  . 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   College education   Neg firearms    [place] of 1   Sleep about 5 hours    G2P1   Sees [facility]    Outpatient Encounter Prescriptions as of 05/05/2014  Medication Sig  . albuterol (PROVENTIL HFA;VENTOLIN HFA) 108 (90 BASE) MCG/ACT inhaler Inhale 2 puffs into the lungs 4 (four) times daily.  Marland Kitchen dicyclomine (BENTYL) 20 MG tablet Take 20 mg by mouth every 6 (six) hours.  Marland Kitchen estradiol (ESTRACE) 1 MG tablet Take 1 tablet (1 mg total) by mouth daily.  . fexofenadine-pseudoephedrine (ALLEGRA-D 24) 180-240 MG per 24 hr tablet Take 1 tablet by mouth as needed.   Marland Kitchen  fluticasone (FLONASE) 50 MCG/ACT nasal spray as needed. 2 spray each nostril qd  . glucose blood (ONE TOUCH ULTRA TEST) test strip Use as instructed to check blood sugar once a day alternating meals dx code 790.29  . Insulin Pen Needle (BD PEN NEEDLE NANO U/F) 32G X 4 MM MISC Use one pen needle per day with Victoza  . Liraglutide 18 MG/3ML SOPN Inject 1.2 mg into the skin daily.  . meclizine (ANTIVERT) 25 MG tablet Take 25 mg by mouth 3 (three) times daily as needed for dizziness.  . Olopatadine HCl 0.2 % SOLN Apply 1 drop to eye as needed.  Marland Kitchen omeprazole (PRILOSEC) 20 MG capsule Take 20 mg by mouth as needed.  Glory Rosebush DELICA LANCETS FINE MISC Use to obtain a blood specimen once a day dx code 790.29  . [DISCONTINUED] estradiol (ESTRACE) 1 MG tablet Take 1 tablet (1 mg total) by mouth daily.  . [DISCONTINUED] Liraglutide (VICTOZA) 18 MG/3ML SOPN Inject 0.6 mg daily for 7 days, then if no nausea increase to 1.2 mg daily  . [DISCONTINUED] metFORMIN (GLUCOPHAGE-XR) 500 MG 24 hr tablet Take 1 tablet (500 mg total) by mouth daily with breakfast.    EXAM:  BP 114/80   Temp(Src) 98.8 F (37.1 C) (Oral)  Ht 5' 1.75" (1.568 m)  Wt 247 lb (112.038 kg)  BMI 45.57 kg/m2  LMP 12/15/2011  Body mass index is 45.57 kg/(m^2).  GENERAL: vitals reviewed and listed above, alert, oriented, appears well hydrated and in no acute distress HEENT: atraumatic, conjunctiva  clear, no obvious abnormalities on inspection of external nose and ears NECK: no obvious masses on inspection palpation  LUNGS: clear to auscultation bilaterally, no wheezes, rales or rhonchi,  CV: HRRR, no clubbing cyanosis or  peripheral edema nl cap refill  MS: moves all extremities without noticeable focal  Abnormality Skin fine rash papular on forearms sun areas  PSYCH: pleasant and cooperative, no obvious depression or anxiety Wt Readings from Last 3 Encounters:  05/05/14 247 lb (112.038 kg)  03/04/14 247 lb (112.038 kg)  02/24/14 249 lb (112.946 kg)    ASSESSMENT AND PLAN:  Discussed the following assessment and plan:  Menopausal hot flushes - so much better on hrt continue benefit much more than risk  Morbid obesity - working on it  fu with dr Dahlia Byes is a  [facility] weight loss med  Prediabetes - now prob DM criteria  fu dr Dwyane Dee.  Solar dermatitis ? - cool com and hcs  fu ifcontinues   -Patient advised to return or notify health care team  if symptoms worsen ,persist or new concerns arise.  Patient Instructions  Continue with dr Dwyane Dee   considier metformin also if he advises this   [facility] : [facility] / [facility]  Continue on the estrogen.    It seems to be helping you. The rash seems solar related  So stay  out of direct sun and can try hydrocortisone otc for a week or so .   Preventive visit next February  Or thereabouts    [name]. Jennye Runquist M.D.

## 2014-05-06 ENCOUNTER — Ambulatory Visit: Payer: 59 | Admitting: Endocrinology

## 2014-05-06 DIAGNOSIS — Z0289 Encounter for other administrative examinations: Secondary | ICD-10-CM

## 2014-05-11 ENCOUNTER — Other Ambulatory Visit: Payer: Self-pay

## 2014-05-12 ENCOUNTER — Other Ambulatory Visit: Payer: Self-pay

## 2014-05-12 DIAGNOSIS — Z1231 Encounter for screening mammogram for malignant neoplasm of breast: Secondary | ICD-10-CM

## 2014-05-18 ENCOUNTER — Ambulatory Visit: Payer: 59

## 2014-06-18 ENCOUNTER — Ambulatory Visit
Admission: RE | Admit: 2014-06-18 | Discharge: 2014-06-18 | Disposition: A | Discharge status: Home / Self Care | Payer: 59 | Source: Ambulatory Visit

## 2014-06-18 DIAGNOSIS — Z1231 Encounter for screening mammogram for malignant neoplasm of breast: Secondary | ICD-10-CM

## 2014-08-19 ENCOUNTER — Telehealth: Payer: Self-pay | Admitting: Endocrinology

## 2014-08-19 ENCOUNTER — Other Ambulatory Visit: Payer: Self-pay | Admitting: *Deleted

## 2014-08-19 MED ORDER — LIRAGLUTIDE 18 MG/3ML ~~LOC~~ SOPN: 1.2000 mg | PEN_INJECTOR | Freq: Every day | SUBCUTANEOUS | Status: DC

## 2014-08-19 NOTE — Telephone Encounter (Signed)
I spoke with the patient, she did not show for her appointment in July, she is coming in 11/6 for labs and an office visit with Dr. Lucianne MussKumar on 11/19, rx sent for 30 day supply of Victoza

## 2014-08-19 NOTE — Telephone Encounter (Signed)
Patient has some concern about a rash on her forearms and elbows   Per patient her and Dr. Lucianne MussKumar have discussed different changes with diabetes and she was wondering if this should be of concern    Please advise patient    Thank you

## 2014-08-19 NOTE — Telephone Encounter (Signed)
Patient would like to refill her victoza   Pharmacy: [facility] on [address]   Thank you

## 2014-08-21 ENCOUNTER — Other Ambulatory Visit: Payer: 59

## 2014-08-21 ENCOUNTER — Other Ambulatory Visit (INDEPENDENT_AMBULATORY_CARE_PROVIDER_SITE_OTHER): Payer: 59

## 2014-08-21 DIAGNOSIS — E119 Type 2 diabetes mellitus without complications: Secondary | ICD-10-CM

## 2014-08-21 LAB — GLUCOSE, RANDOM: Glucose, Bld: 103 mg/dL — ABNORMAL HIGH (ref 70–99)

## 2014-08-21 LAB — HEMOGLOBIN A1C: Hgb A1c MFr Bld: 6.3 % (ref 4.6–6.5)

## 2014-09-03 ENCOUNTER — Ambulatory Visit: Payer: 59 | Admitting: Endocrinology

## 2014-09-23 ENCOUNTER — Ambulatory Visit: Payer: 59 | Admitting: Endocrinology

## 2014-09-23 ENCOUNTER — Encounter: Payer: Self-pay | Admitting: *Deleted

## 2014-09-23 ENCOUNTER — Telehealth: Payer: Self-pay | Admitting: Endocrinology

## 2014-09-23 NOTE — Telephone Encounter (Signed)
Letter mailed

## 2014-09-23 NOTE — Telephone Encounter (Signed)
Patient no showed today's appt. Please advise on how to follow up. °A. No follow up necessary. °B. Follow up urgent. Contact patient immediately. °C. Follow up necessary. Contact patient and schedule visit in ___ days. °D. Follow up advised. Contact patient and schedule visit in ____weeks. ° °

## 2014-10-02 NOTE — Telephone Encounter (Signed)
Letter has been mailed I attempted to reach pt via phone and the number is out of service

## 2014-11-30 ENCOUNTER — Other Ambulatory Visit: Payer: 59

## 2014-12-02 ENCOUNTER — Other Ambulatory Visit (INDEPENDENT_AMBULATORY_CARE_PROVIDER_SITE_OTHER): Payer: 59

## 2014-12-02 DIAGNOSIS — Z0000 Encounter for general adult medical examination without abnormal findings: Secondary | ICD-10-CM

## 2014-12-02 LAB — CBC WITH DIFFERENTIAL/PLATELET
Basophils Absolute: 0 10*3/uL (ref 0.0–0.1)
Basophils Relative: 0.2 % (ref 0.0–3.0)
EOS ABS: 0.1 10*3/uL (ref 0.0–0.7)
Eosinophils Relative: 1.2 % (ref 0.0–5.0)
HCT: 38.7 % (ref 36.0–46.0)
Hemoglobin: 12.8 g/dL (ref 12.0–15.0)
LYMPHS PCT: 41 % (ref 12.0–46.0)
Lymphs Abs: 4.8 10*3/uL — ABNORMAL HIGH (ref 0.7–4.0)
MCHC: 33 g/dL (ref 30.0–36.0)
MCV: 84 fl (ref 78.0–100.0)
Monocytes Absolute: 0.6 10*3/uL (ref 0.1–1.0)
Monocytes Relative: 4.7 % (ref 3.0–12.0)
NEUTROS PCT: 52.9 % (ref 43.0–77.0)
Neutro Abs: 6.2 10*3/uL (ref 1.4–7.7)
Platelets: 416 10*3/uL — ABNORMAL HIGH (ref 150.0–400.0)
RBC: 4.61 Mil/uL (ref 3.87–5.11)
RDW: 17.1 % — ABNORMAL HIGH (ref 11.5–15.5)
WBC: 11.7 10*3/uL — ABNORMAL HIGH (ref 4.0–10.5)

## 2014-12-02 LAB — HEPATIC FUNCTION PANEL
ALT: 16 U/L (ref 0–35)
AST: 16 U/L (ref 0–37)
Albumin: 3.8 g/dL (ref 3.5–5.2)
Alkaline Phosphatase: 80 U/L (ref 39–117)
BILIRUBIN DIRECT: 0 mg/dL (ref 0.0–0.3)
BILIRUBIN TOTAL: 0.2 mg/dL (ref 0.2–1.2)
TOTAL PROTEIN: 7.9 g/dL (ref 6.0–8.3)

## 2014-12-02 LAB — BASIC METABOLIC PANEL
BUN: 23 mg/dL (ref 6–23)
CO2: 26 mEq/L (ref 19–32)
Calcium: 9.8 mg/dL (ref 8.4–10.5)
Chloride: 107 mEq/L (ref 96–112)
Creatinine, Ser: 0.83 mg/dL (ref 0.40–1.20)
GFR: 93.97 mL/min (ref >60.00)
Glucose, Bld: 105 mg/dL — ABNORMAL HIGH (ref 70–99)
Potassium: 4.4 mEq/L (ref 3.5–5.1)
SODIUM: 140 meq/L (ref 135–145)

## 2014-12-02 LAB — HEMOGLOBIN A1C: HEMOGLOBIN A1C: 6.7 % — AB (ref 4.6–6.5)

## 2014-12-02 LAB — LIPID PANEL
CHOLESTEROL: 190 mg/dL (ref 0–200)
HDL: 37.7 mg/dL — ABNORMAL LOW (ref >39.00)
LDL Cholesterol: 121 mg/dL — ABNORMAL HIGH (ref 0–99)
NonHDL: 152.3
TRIGLYCERIDES: 155 mg/dL — AB (ref 0.0–149.0)
Total CHOL/HDL Ratio: 5
VLDL: 31 mg/dL (ref 0.0–40.0)

## 2014-12-02 LAB — TSH: TSH: 1.67 u[IU]/mL (ref 0.35–4.50)

## 2014-12-07 ENCOUNTER — Telehealth: Payer: Self-pay | Admitting: Family Medicine

## 2014-12-07 NOTE — Telephone Encounter (Signed)
Pt is not coming in until 03/17/15.  What would you like me to tell her about last lab work?

## 2014-12-07 NOTE — Telephone Encounter (Signed)
appt canceled for feb 26  Because of new job consisderations. She needs to make appt with dr Lucianne MussKumar to fu about  Her blood sugar    Lab Results  Component Value Date   WBC 11.7* 12/02/2014   HGB 12.8 12/02/2014   HCT 38.7 12/02/2014   PLT 416.0* 12/02/2014   GLUCOSE 105* 12/02/2014   CHOL 190 12/02/2014   TRIG 155.0* 12/02/2014   HDL 37.70* 12/02/2014   LDLCALC 121* 12/02/2014   ALT 16 12/02/2014   AST 16 12/02/2014   NA 140 12/02/2014   K 4.4 12/02/2014   CL 107 12/02/2014   CREATININE 0.83 12/02/2014   BUN 23 12/02/2014   CO2 26 12/02/2014   TSH 1.67 12/02/2014   HGBA1C 6.7* 12/02/2014

## 2014-12-08 ENCOUNTER — Encounter: Payer: 59 | Admitting: Internal Medicine

## 2014-12-15 NOTE — Telephone Encounter (Signed)
What should I tell the pt about her lab work?

## 2014-12-15 NOTE — Telephone Encounter (Signed)
Sugar is in the diabetic range cholesterol  could be better Can talk more when she comes in  Can send her a copy

## 2014-12-16 NOTE — Telephone Encounter (Signed)
Spoke to the pt.  Informed her of results.  Instructed her to call Dr. Remus BlakeKumar's office for appt.  Offered to help facilitate appt.  Pt stated she will call.  Advised she call back if she needs any help.  [facility] about cholesterol when she comes in.

## 2015-03-04 ENCOUNTER — Telehealth: Payer: Self-pay | Admitting: Internal Medicine

## 2015-03-04 NOTE — Telephone Encounter (Signed)
Has appointment with DO Selena BattenKim tomorrow morning

## 2015-03-04 NOTE — Telephone Encounter (Signed)
[facility] - Client TELEPHONE ADVICE RECORD [facility] Patient Name: Monique CampionLETITIA Galentine DOB: 04-14-1966 Initial Comment Caller states she has either bacterial or yeast infection, wants rx Nurse Assessment Nurse: Ladona RidgelGaddy, RN, Felicia Date/Time (Eastern Time): 03/04/2015 3:02:26 PM Confirm and document reason for call. If symptomatic, describe symptoms. ---Pt has vaginal discharged for a few days - not sexually active for a few years. Bad odor and itchy. No color of discharge seen. No fever Has the patient traveled out of the country within the last 30 days? ---No Does the patient require triage? ---Yes Related visit to physician within the last 2 weeks? ---No Does the PT have any chronic conditions? (i.e. diabetes, asthma, etc.) ---YesList chronic conditions. ---DM Did the patient indicate they were pregnant? ---No Guidelines Guideline Title Affirmed Question Affirmed Notes Vaginal Discharge Bad smelling vaginal discharge Final Disposition User See PCP When Office is Open (within 3 days) Ladona RidgelGaddy, RN, [employer]Felicia Call Id: [phone]

## 2015-03-05 ENCOUNTER — Ambulatory Visit (INDEPENDENT_AMBULATORY_CARE_PROVIDER_SITE_OTHER): Payer: 59 | Admitting: Family Medicine

## 2015-03-05 ENCOUNTER — Encounter: Payer: Self-pay | Admitting: Family Medicine

## 2015-03-05 ENCOUNTER — Other Ambulatory Visit (HOSPITAL_COMMUNITY)
Admission: RE | Admit: 2015-03-05 | Discharge: 2015-03-05 | Disposition: A | Discharge status: Home / Self Care | Payer: 59 | Source: Ambulatory Visit | Attending: Family Medicine | Admitting: Family Medicine

## 2015-03-05 ENCOUNTER — Telehealth: Payer: Self-pay | Admitting: *Deleted

## 2015-03-05 VITALS — BP 100/78 | HR 94 | Temp 98.3°F | Ht 61.75 in | Wt 260.1 lb

## 2015-03-05 DIAGNOSIS — N760 Acute vaginitis: Secondary | ICD-10-CM | POA: Insufficient documentation

## 2015-03-05 DIAGNOSIS — L292 Pruritus vulvae: Secondary | ICD-10-CM | POA: Diagnosis not present

## 2015-03-05 DIAGNOSIS — Z113 Encounter for screening for infections with a predominantly sexual mode of transmission: Secondary | ICD-10-CM | POA: Diagnosis not present

## 2015-03-05 MED ORDER — ESTRADIOL 1 MG PO TABS: 1.0000 mg | ORAL_TABLET | Freq: Every day | ORAL | Status: DC

## 2015-03-05 NOTE — Telephone Encounter (Signed)
Sent to the pharmacy by e-scribe. 

## 2015-03-05 NOTE — Addendum Note (Signed)
Addended by: Johnella MoloneyFUNDERBURK, Lambert Jeanty A on: 03/05/2015 08:24 AM   Modules accepted: Orders

## 2015-03-05 NOTE — Progress Notes (Signed)
HPI:  Vulvovag pruritis: -started 1 week ago -after changing soap -symptoms: vulvovag pruritis, discharge with odor -denies sexual activity, concern for STI, abd or pelvic pain, dysuria, fevers, nvd -hx BV and yeast infection -s/p complete hysterectomy for fibroids - complete  ROS: See pertinent positives and negatives per HPI.  Past Medical History  Diagnosis Date  . Vertigo     intermittent infrequent  . Drug addiction     recovering  . Hx of varicella   . Substance abuse in remission 13 years ago     cocaine heroiin  met   support via ADP  . Ex-smoker 2009  . History of fracture of wrist 17 years ago    has plate and pins right   . Hx of hysterectomy     bleeding and cysts    Past Surgical History  Procedure Laterality Date  . Rt hand surgery      17 years ago  . Ablastion    . Abdominal hysterectomy      Family History  Problem Relation Age of Onset  . COPD Mother   . Diabetes Mother   . Hypertension Mother   . Sleep apnea Mother     and sister   . Thyroid disease Mother   . Chronic fatigue Mother   . Glaucoma Father   . Other Father     prostate disease  . Osteoarthritis Mother     knee replacement   . Alcohol abuse      drug parent    History   Social History  . Marital Status: Single    Spouse Name: N/A  . Number of Children: 1  . Years of Education: N/A   Occupational History  .  [employer]   Social History Main Topics  . Smoking status: Former Smoker -- 0.04 packs/day for 8 years    Types: Cigarettes    Start date: 10/21/2013  . Smokeless tobacco: Not on file     Comment: off and on x 8 yrs  . Alcohol Use: No  . Drug Use: Yes    Special: "Crack" cocaine, Heroin, Methamphetamines, Marijuana     Comment: History- 14 years clean-11/21/13  . Sexual Activity: Not on file   Other Topics Concern  . 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   College education   Neg firearms    [place] of 1   Sleep about 5 hours    G2P1   Sees [facility]     Current outpatient prescriptions:  .  estradiol (ESTRACE) 1 MG tablet, Take 1 tablet (1 mg total) by mouth daily., Disp: 30 tablet, Rfl: 10 .  glucose blood (ONE TOUCH ULTRA TEST) test strip, Use as instructed to check blood sugar once a day alternating meals dx code 790.29, Disp: 50 each, Rfl: 3 .  Insulin Pen Needle (BD PEN NEEDLE NANO U/F) 32G X 4 MM MISC, Use one pen needle per day with Victoza, Disp: 30 each, Rfl: 3 .  Liraglutide 18 MG/3ML SOPN, Inject 1.2 mg into the skin daily., Disp: 2 pen, Rfl: 0 .  ONETOUCH DELICA LANCETS FINE MISC, Use to obtain a blood specimen once a day dx code 790.29, Disp: 100 each, Rfl: 1  EXAM:  Filed Vitals:   03/05/15 0806  BP: 100/78  Pulse: 94  Temp: 98.3 F (36.8 C)  Body mass index is 47.99 kg/(m^2).  GENERAL: vitals reviewed and listed above, alert, oriented, appears well hydrated and in no acute distress  HEENT: atraumatic, conjunttiva clear, no obvious abnormalities on inspection of external nose and ears  NECK: no obvious masses on inspection  GU: normal appearance of ext genitalia other then mild VV irritation, normal vaginal exam other then white homogenous discharge.  ABD: BS+, soft, NTTP  MS: moves all extremities without noticeable abnormality  PSYCH: pleasant and cooperative, no obvious depression or anxiety  ASSESSMENT AND PLAN:  Discussed the following assessment and plan:  Vulvovaginal pruritus  -wet prep with yeast, BV, GC/Chlam pending, trich -Patient advised to return or notify a doctor immediately if symptoms worsen or persist or new concerns arise.  Patient Instructions  -change back to dove unscented soap  -no douching  -trial yeast treatment such as monistat - available over the counter, avoid carbs and sweets, probiotic  -We have ordered labs or studies at this visit. It can take up to  1-2 weeks for results and processing. We will contact you with instructions IF your results are abnormal. Normal results will be released to your [facility]. If you have not heard from [place] or can not find your results in [facility] in 2 weeks please contact our office. If this shows bacterial vaginosis we will let you know and treat with an antibiotic.            Colin Benton R.

## 2015-03-05 NOTE — Progress Notes (Signed)
Pre visit review using our clinic review tool, if applicable. No additional management support is needed unless otherwise documented below in the visit note. 

## 2015-03-05 NOTE — Patient Instructions (Signed)
-  change back to dove unscented soap  -no douching  -trial yeast treatment such as monistat - available over the counter, avoid carbs and sweets, probiotic  -We have ordered labs or studies at this visit. It can take up to 1-2 weeks for results and processing. We will contact you with instructions IF your results are abnormal. Normal results will be released to your [facility]. If you have not heard from us or can not find your results in [facility] in 2 weeks please contact our office. If this shows bacterial vaginosis we will let you know and treat with an antibiotic.

## 2015-03-05 NOTE — Telephone Encounter (Signed)
Patient was seen by Dr Selena BattenKim today and requests a refill for Estrace [facility] [address]

## 2015-03-08 LAB — CERVICOVAGINAL ANCILLARY ONLY
Chlamydia: NEGATIVE
Neisseria Gonorrhea: NEGATIVE
TRICH (WINDOWPATH): NEGATIVE

## 2015-03-10 ENCOUNTER — Other Ambulatory Visit: Payer: 59

## 2015-03-10 LAB — CERVICOVAGINAL ANCILLARY ONLY: Candida vaginitis: NEGATIVE

## 2015-03-17 ENCOUNTER — Encounter: Payer: 59 | Admitting: Internal Medicine

## 2015-03-17 MED ORDER — METRONIDAZOLE 500 MG PO TABS: 500.0000 mg | ORAL_TABLET | Freq: Three times a day (TID) | ORAL | Status: DC

## 2015-03-17 NOTE — Progress Notes (Signed)
Document opened and reviewed for wellness visit . No showed .  

## 2015-03-17 NOTE — Addendum Note (Signed)
Addended by: Johnella MoloneyFUNDERBURK, Kaitrin Seybold A on: 03/17/2015 04:40 PM   Modules accepted: Orders

## 2015-03-25 ENCOUNTER — Telehealth: Payer: Self-pay | Admitting: Internal Medicine

## 2015-03-25 NOTE — Telephone Encounter (Signed)
Pt missed her cpe appt and had to resc for sept. However , pt would like to know if there is anything she can get for vaginal dryness during intercourse.  Pt has had complete hysterectomy and no cervix  And states she is very, very uncomfortable. Was hoping to get some advise prior to her cpe. [facility] [address]

## 2015-03-26 NOTE — Telephone Encounter (Signed)
LM on listed number for the pt to return my call. 

## 2015-03-26 NOTE — Telephone Encounter (Signed)
Hard to know without seeing her .  otc lubricant.  If thinks could have a yeast infection then  .. empric rx with otc monistat. Sometimes we use topical estrogen but would need ov to decide on this . She can make ov   For this problem

## 2015-03-26 NOTE — Telephone Encounter (Signed)
Pt called back and left a message on my machine.  Tried to return her call.  Left a message.

## 2015-03-30 NOTE — Telephone Encounter (Signed)
Spoke to the pt.  She has tried multiple types of otc lubricants with no help.  Does not feel like she has a yeast infection.  Would like to discuss topical estrogen and has made a follow up visit for 04/01/15 @ 10 AM.

## 2015-04-01 ENCOUNTER — Ambulatory Visit (INDEPENDENT_AMBULATORY_CARE_PROVIDER_SITE_OTHER): Payer: 59 | Admitting: Internal Medicine

## 2015-04-01 ENCOUNTER — Encounter: Payer: Self-pay | Admitting: Internal Medicine

## 2015-04-01 VITALS — BP 146/90 | Temp 98.4°F | Ht 61.5 in | Wt 262.1 lb

## 2015-04-01 DIAGNOSIS — N907 Vulvar cyst: Secondary | ICD-10-CM | POA: Diagnosis not present

## 2015-04-01 DIAGNOSIS — N898 Other specified noninflammatory disorders of vagina: Secondary | ICD-10-CM

## 2015-04-01 MED ORDER — CEPHALEXIN 500 MG PO CAPS: 500.0000 mg | ORAL_CAPSULE | Freq: Three times a day (TID) | ORAL | Status: DC

## 2015-04-01 NOTE — Progress Notes (Signed)
Pre visit review using our clinic review tool, if applicable. No additional management support is needed unless otherwise documented below in the visit note.  Chief Complaint  Patient presents with  . Vaginal Dryness    Also has a "hard spot" on her labia.  Found this morning.    HPI: Patient Monique Nelson  comes in today for SDA for  new problem evaluation. Or vag dryness  Failed otc meds  . See phone messages Has dx of dm controlled  and rx for yeast she came and saw dr kim 3 -4weeks ago and rx her for  Yeast with monistat . Had vaginal evaluation pos bv bact  neg for yeast  rx with flagyl   Got better     No sever sx  ?concer about  Lubrication.   Considering new relationship. Then in am  Hard labia area.  This am shower.  No nfever no exposures  .  ROS: See pertinent positives and negatives per HPI. No uti sx   onestrace pills  Had surgical meopause   Past Medical History  Diagnosis Date  . Vertigo     intermittent infrequent  . Drug addiction     recovering  . Hx of varicella   . Substance abuse in remission 13 years ago     cocaine heroiin  met   support via ADP  . Ex-smoker 2009  . History of fracture of wrist 17 years ago    has plate and pins right   . Hx of hysterectomy     bleeding and cysts    Family History  Problem Relation Age of Onset  . COPD Mother   . Diabetes Mother   . Hypertension Mother   . Sleep apnea Mother     and sister   . Thyroid disease Mother   . Chronic fatigue Mother   . Glaucoma Father   . Other Father     prostate disease  . Osteoarthritis Mother     knee replacement   . Alcohol abuse      drug parent    History   Social History  . Marital Status: Single    Spouse Name: N/A  . Number of Children: 1  . Years of Education: N/A   Occupational History  .  [employer]   Social History Main Topics  . Smoking status: Former Smoker -- 0.04 packs/day for 8 years    Types: Cigarettes    Start date: 10/21/2013  .  Smokeless tobacco: Not on file     Comment: off and on x 8 yrs  . Alcohol Use: No  . Drug Use: Yes    Special: "Crack" cocaine, Heroin, Methamphetamines, Marijuana     Comment: History- 14 years clean-11/21/13  . Sexual Activity: Not on file   Other Topics Concern  . 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   College education   Neg firearms    [place] of 1   Sleep about 5 hours    G2P1   Sees [facility]    Outpatient Prescriptions Prior to Visit  Medication Sig Dispense Refill  . estradiol (ESTRACE) 1 MG tablet Take 1 tablet (1 mg total) by mouth daily. 30 tablet 0  . glucose blood (ONE TOUCH ULTRA TEST) test strip Use as instructed to check blood sugar once a day alternating  meals dx code 790.29 50 each 3  . Insulin Pen Needle (BD PEN NEEDLE NANO U/F) 32G X 4 MM MISC Use one pen needle per day with Victoza 30 each 3  . Liraglutide 18 MG/3ML SOPN Inject 1.2 mg into the skin daily. 2 pen 0  . ONETOUCH DELICA LANCETS FINE MISC Use to obtain a blood specimen once a day dx code 790.29 100 each 1  . metroNIDAZOLE (FLAGYL) 500 MG tablet Take 1 tablet (500 mg total) by mouth 3 (three) times daily. 21 tablet 0   No facility-administered medications prior to visit.     EXAM:  BP 146/90 mmHg  Temp(Src) 98.4 F (36.9 C) (Oral)  Ht 5' 1.5" (1.562 m)  Wt 262 lb 1.6 oz (118.888 kg)  BMI 48.73 kg/m2  LMP 12/15/2011  Body mass index is 48.73 kg/(m^2).  GENERAL: vitals reviewed and listed above, alert, oriented, appears well hydrated and in no acute distress HEENT: atraumatic, conjunctiva  clear, no obvious abnormalities on inspection of external nose and ears OP : no lesion edema or exudate  Ext gu right mid labial pea sized nodule pink red ? If center no  fluctuance  But tender . No dc  No adenopathy  PSYCH: pleasant and cooperative, no obvious depression or anxiety Wt Readings from Last 3  Encounters:  04/01/15 262 lb 1.6 oz (118.888 kg)  03/05/15 260 lb 1.6 oz (117.981 kg)  05/05/14 247 lb (112.038 kg)   BP Readings from Last 3 Encounters:  04/01/15 146/90  03/05/15 100/78  05/05/14 114/80     ASSESSMENT AND PLAN:  Discussed the following assessment and plan:  Labial cystvs boild infected ? - small   add antibiotic warm compresses   Vaginal dryness - ho given lubricants  dryness etc  on hrt . optinos discussed  Can add monistat if needed  -Patient advised to return or notify health care team  if symptoms worsen ,persist or new concerns arise.  Patient Instructions  Area looks like a small boil of cyst  inflammed  Warm compresses  3 x per day moist heat  May help resolve and sometimes drain before getting better. Add 5 day antibiotic  alos  Treat for yeast if needed.  Ho on vaginal lubricants also to try as needed. Because you are on  Estrogen replacment may not need estrogen topical . Make sure you get you routine appt with endocrinology    [name]. Sye Schroepfer M.D.

## 2015-04-01 NOTE — Patient Instructions (Addendum)
Area looks like a small boil of cyst  inflammed  Warm compresses  3 x per day moist heat  May help resolve and sometimes drain before getting better. Add 5 day antibiotic  alos  Treat for yeast if needed.  Ho on vaginal lubricants also to try as needed. Because you are on  Estrogen replacment may not need estrogen topical . Make sure you get you routine appt with endocrinology

## 2015-04-20 ENCOUNTER — Other Ambulatory Visit: Payer: Self-pay | Admitting: Internal Medicine

## 2015-04-22 ENCOUNTER — Telehealth: Payer: Self-pay | Admitting: *Deleted

## 2015-04-22 NOTE — Telephone Encounter (Signed)
Patient is requesting a refill of estradiol (ESTRACE) 1 MG tablet [facility] [phone]

## 2015-04-22 NOTE — Telephone Encounter (Signed)
Duplicate Request.

## 2015-04-23 NOTE — Telephone Encounter (Signed)
Pt is requesting a refill on her estrace. Please sent to [facility] on [address].

## 2015-04-26 NOTE — Telephone Encounter (Signed)
Ok x 6 months 

## 2015-04-26 NOTE — Telephone Encounter (Signed)
Sent to the pharmacy by e-scribe. 

## 2015-05-12 ENCOUNTER — Other Ambulatory Visit: Payer: Self-pay | Admitting: *Deleted

## 2015-05-12 ENCOUNTER — Other Ambulatory Visit (INDEPENDENT_AMBULATORY_CARE_PROVIDER_SITE_OTHER): Payer: 59

## 2015-05-12 DIAGNOSIS — R7303 Prediabetes: Secondary | ICD-10-CM

## 2015-05-12 DIAGNOSIS — R7309 Other abnormal glucose: Secondary | ICD-10-CM | POA: Diagnosis not present

## 2015-05-12 LAB — BASIC METABOLIC PANEL
BUN: 12 mg/dL (ref 6–23)
CO2: 26 mEq/L (ref 19–32)
Calcium: 9.2 mg/dL (ref 8.4–10.5)
Chloride: 107 mEq/L (ref 96–112)
Creatinine, Ser: 0.84 mg/dL (ref 0.40–1.20)
GFR: 92.51 mL/min (ref >60.00)
Glucose, Bld: 110 mg/dL — ABNORMAL HIGH (ref 70–99)
POTASSIUM: 3.8 meq/L (ref 3.5–5.1)
Sodium: 140 mEq/L (ref 135–145)

## 2015-05-12 LAB — LIPID PANEL
Cholesterol: 196 mg/dL (ref 0–200)
HDL: 35.3 mg/dL — ABNORMAL LOW (ref >39.00)
LDL CALC: 140 mg/dL — AB (ref 0–99)
NonHDL: 160.7
Total CHOL/HDL Ratio: 6
Triglycerides: 102 mg/dL (ref 0.0–149.0)
VLDL: 20.4 mg/dL (ref 0.0–40.0)

## 2015-05-12 LAB — HEMOGLOBIN A1C: Hgb A1c MFr Bld: 6.1 % (ref 4.6–6.5)

## 2015-05-17 ENCOUNTER — Other Ambulatory Visit: Payer: Self-pay

## 2015-05-17 ENCOUNTER — Ambulatory Visit (INDEPENDENT_AMBULATORY_CARE_PROVIDER_SITE_OTHER): Payer: 59 | Admitting: Endocrinology

## 2015-05-17 ENCOUNTER — Encounter: Payer: Self-pay | Admitting: Endocrinology

## 2015-05-17 VITALS — BP 128/80 | HR 80 | Temp 98.7°F | Resp 16 | Ht 62.0 in | Wt 256.0 lb

## 2015-05-17 DIAGNOSIS — E119 Type 2 diabetes mellitus without complications: Secondary | ICD-10-CM

## 2015-05-17 DIAGNOSIS — E669 Obesity, unspecified: Secondary | ICD-10-CM | POA: Diagnosis not present

## 2015-05-17 MED ORDER — INSULIN PEN NEEDLE 32G X 4 MM MISC: Status: DC

## 2015-05-17 MED ORDER — LIRAGLUTIDE 18 MG/3ML ~~LOC~~ SOPN: PEN_INJECTOR | SUBCUTANEOUS | Status: DC

## 2015-05-17 NOTE — Patient Instructions (Addendum)
Victoza 0.6mg  daily for 5 days, the 1.2mg  for another 5 days then 1.8 mg  Daily  Check on Saxenda  Check blood sugars on waking up .Marland Kitchen 1-2 .. times a week Also check blood sugars about 2 hours after a meal and do this after different meals by rotation  Recommended blood sugar levels on waking up is 90-130 and about 2 hours after meal is 140-180 Please bring blood sugar monitor to each visit.

## 2015-05-17 NOTE — Progress Notes (Signed)
Patient ID: Monique Nelson, female   DOB: 1965-10-23, 49 y.o.   MRN: [id number]    Reason for Appointment : Followup for Type 2 Diabetes  History of Present Illness          Diagnosis: Type 2 diabetes mellitus, date of diagnosis: 2015       Past history: She has had impaired fasting glucose as per her record since about 2009 with the highest level being 110 Has not had any nutritional counseling or any specific management of this previously On her routine exam her A1c was found to be 6.6 in 11/2013 and random glucose was 112  Recent history:  She was given metformin 500 mg by her PCP Since she had been able to lose weight with lifestyle changes on her own she wanted to try stopping her metformin However she was also interested in weight loss drug in place of metformin and she was started on Victoza in 5/16  However she has not come back for follow-up since then Also she appears to be taking only 0.6 mg Victoza instead of 1.2 She has gained weight since her last visit  Current blood sugar patterns and problems identified:  She has not been exercising but only recently has started going to the gym regularly  She has gained weight since last year  She checks her blood sugar very sporadically and has only 3 recent readings with highest 169 late afternoon  Her lab glucose fasting was 110  Her A1c however has improved to 6.1, had been 6.7 in February probably done through her work She has not seen a dietitian yet   Oral hypoglycemic drugs the patient is taking are: none    Side effects from medications have been: None  Glucose monitoring with One Touch ultra:  fasting reading: 124, lunchtime 129 and suppertime 169  Glycemic control:   Lab Results  Component Value Date   HGBA1C 6.1 05/12/2015   HGBA1C 6.7* 12/02/2014   HGBA1C 6.3 08/21/2014   Lab Results  Component Value Date   LDLCALC 140* 05/12/2015   CREATININE 0.84 05/12/2015    Self-care: The diet that  the patient has been following is: tries to limit fat intake; for breakfast is having egg whites or [place] sausage Usually having a salad or meat and vegetables at lunch, snacks will be fruit occasionally  Meals: 3 meals per day.          Exercise: 5/7 days a week at the gym recently only        Dietician visit: Most recent: None .               Compliance with the medical regimen: fair    Weight history:  Wt Readings from Last 3 Encounters:  05/17/15 256 lb (116.121 kg)  04/01/15 262 lb 1.6 oz (118.888 kg)  03/05/15 260 lb 1.6 oz (117.981 kg)      Medication List       This list is accurate as of: 05/17/15  2:02 PM.  Always use your most recent med list.               estradiol 1 MG tablet  Commonly known as:  ESTRACE  TAKE 1 TABLET(1 MG) BY MOUTH DAILY     glucose blood test strip  Commonly known as:  ONE TOUCH ULTRA TEST  Use as instructed to check blood sugar once a day alternating meals dx code 790.29     Insulin Pen Needle  32G X 4 MM Misc  Commonly known as:  BD PEN NEEDLE NANO U/F  Use one pen needle per day with Victoza     Liraglutide 18 MG/3ML Sopn  Inject 1.2 mg into the skin daily.     Liraglutide 18 MG/3ML Sopn  Commonly known as:  VICTOZA  Inject 1.8 into skin once daily     ONETOUCH DELICA LANCETS FINE Misc  Use to obtain a blood specimen once a day dx code 790.29        Allergies: No Known Allergies  Past Medical History  Diagnosis Date  . Vertigo     intermittent infrequent  . Drug addiction     recovering  . Hx of varicella   . Substance abuse in remission 13 years ago     cocaine heroiin  met   support via ADP  . Ex-smoker 2009  . History of fracture of wrist 17 years ago    has plate and pins right   . Hx of hysterectomy     bleeding and cysts    Past Surgical History  Procedure Laterality Date  . Rt hand surgery      17 years ago  . Ablastion    . Abdominal hysterectomy      Family History  Problem Relation Age of Onset   . COPD Mother   . Diabetes Mother   . Hypertension Mother   . Sleep apnea Mother     and sister   . Thyroid disease Mother   . Chronic fatigue Mother   . Glaucoma Father   . Other Father     prostate disease  . Osteoarthritis Mother     knee replacement   . Alcohol abuse      drug parent    Social History:  reports that she has quit smoking. Her smoking use included Cigarettes. She started smoking about 18 months ago. She has a .32 pack-year smoking history. She does not have any smokeless tobacco history on file. She reports that she uses illicit drugs ("Crack" cocaine, Heroin, Methamphetamines, and Marijuana). She reports that she does not drink alcohol.    Review of Systems       Lipids: As follows, currently not on any statin drugs, LDL appears to be higher than before   Lab Results  Component Value Date   CHOL 196 05/12/2015   HDL 35.30* 05/12/2015   LDLCALC 140* 05/12/2015   TRIG 102.0 05/12/2015   CHOLHDL 6 05/12/2015              She has known sleep apnea   LABS:  Lab on 05/12/2015  Component Date Value Ref Range Status  . Hgb A1c MFr Bld 05/12/2015 6.1  4.6 - 6.5 % Final   Glycemic Control Guidelines for People with Diabetes:Non Diabetic:  <6%Goal of Therapy: <7%Additional Action Suggested:  >8%   . Sodium 05/12/2015 140  135 - 145 mEq/L Final  . Potassium 05/12/2015 3.8  3.5 - 5.1 mEq/L Final  . Chloride 05/12/2015 107  96 - 112 mEq/L Final  . CO2 05/12/2015 26  19 - 32 mEq/L Final  . Glucose, Bld 05/12/2015 110* 70 - 99 mg/dL Final  . BUN 05/12/2015 12  6 - 23 mg/dL Final  . Creatinine, Ser 05/12/2015 0.84  0.40 - 1.20 mg/dL Final  . Calcium 05/12/2015 9.2  8.4 - 10.5 mg/dL Final  . GFR 05/12/2015 92.51  >60.00 mL/min Final  . Cholesterol 05/12/2015 196  0 - 200  mg/dL Final   ATP III Classification       Desirable:  < 200 mg/dL               Borderline High:  200 - 239 mg/dL          High:  > = 240 mg/dL  . Triglycerides 05/12/2015 102.0  0.0 - 149.0  mg/dL Final   Normal:  <150 mg/dLBorderline High:  150 - 199 mg/dL  . HDL 05/12/2015 35.30* >39.00 mg/dL Final  . VLDL 05/12/2015 20.4  0.0 - 40.0 mg/dL Final  . LDL Cholesterol 05/12/2015 140* 0 - 99 mg/dL Final  . Total CHOL/HDL Ratio 05/12/2015 6   Final                  Men          Women1/2 Average Risk     3.4          3.3Average Risk          5.0          4.42X Average Risk          9.6          7.13X Average Risk          15.0          11.0                      . NonHDL 05/12/2015 160.70   Final   NOTE:  Non-HDL goal should be 30 mg/dL higher than patient's LDL goal (i.e. LDL goal of < 70 mg/dL, would have non-HDL goal of < 100 mg/dL)    Physical Examination:  BP 128/80 mmHg  Pulse 80  Temp(Src) 98.7 F (37.1 C) (Oral)  Resp 16  Ht _0  (1.575 m)  Wt 256 lb (116.121 kg)  BMI 46.81 kg/m2  SpO2 94%  LMP 12/15/2011     ASSESSMENT/PLAN:   Diabetes type 2, mild and well controlled with obesity  Although her A1c is fairly good at 6.1 she has only been taking 0.6 mg Victoza Also because of noncompliance with exercise and probably died also she has gained weight since her initial consultation last year Also does not check her blood sugar much and likely may have some postprandial hyperglycemia based on what she is eating  Since she is interested in weight loss she will be referred to the dietitian for meal planning Also advised her to be consistent with diet She will titrate her Victoza to 1.8 mg and she will look into Saxenda also   HYPERLIPIDEMIA: Discussed that she will likely need to be on a statin drug lifelong for cardiovascular protection with her Hyperlipidemia She would like to work on her diet and weight loss and review lipids again in another 3-4 months   Rozalynn Buege 05/17/2015, 2:02 PM

## 2015-05-18 ENCOUNTER — Other Ambulatory Visit: Payer: Self-pay | Admitting: Endocrinology

## 2015-05-18 NOTE — Telephone Encounter (Signed)
Patient stated that [facility] is submitting a request for prescriptions Victoza and pen needles, because it is cheaper.

## 2015-06-07 ENCOUNTER — Encounter: Payer: Self-pay | Admitting: Family Medicine

## 2015-06-07 ENCOUNTER — Ambulatory Visit (INDEPENDENT_AMBULATORY_CARE_PROVIDER_SITE_OTHER): Payer: 59 | Admitting: Family Medicine

## 2015-06-07 VITALS — BP 132/80 | HR 80 | Temp 98.6°F | Wt 253.0 lb

## 2015-06-07 DIAGNOSIS — J020 Streptococcal pharyngitis: Secondary | ICD-10-CM | POA: Diagnosis not present

## 2015-06-07 DIAGNOSIS — J029 Acute pharyngitis, unspecified: Secondary | ICD-10-CM | POA: Diagnosis not present

## 2015-06-07 LAB — POCT RAPID STREP A (OFFICE): RAPID STREP A SCREEN: POSITIVE — AB

## 2015-06-07 MED ORDER — AMOXICILLIN 875 MG PO TABS: 875.0000 mg | ORAL_TABLET | Freq: Two times a day (BID) | ORAL | Status: AC

## 2015-06-07 MED ORDER — AMOXICILLIN 875 MG PO TABS: 875.0000 mg | ORAL_TABLET | Freq: Two times a day (BID) | ORAL | Status: DC

## 2015-06-07 NOTE — Patient Instructions (Signed)

## 2015-06-07 NOTE — Progress Notes (Signed)
   Subjective:    Patient ID: Monique Nelson, female    DOB: 08-06-1966, 49 y.o.   MRN: [id number]  HPI Acute visit for sore throat. Started couple days ago. She's had fever up to 102 with severe body aches and headaches. She denies a nasal congestion or cough. She's had severe myalgias. No skin rash. No sick contacts. Mild nausea but no vomiting. She's taken ibuprofen with mild relief  Past Medical History  Diagnosis Date  . Vertigo     intermittent infrequent  . Drug addiction     recovering  . Hx of varicella   . Substance abuse in remission 13 years ago     cocaine heroiin  met   support via ADP  . Ex-smoker 2009  . History of fracture of wrist 17 years ago    has plate and pins right   . Hx of hysterectomy     bleeding and cysts   Past Surgical History  Procedure Laterality Date  . Rt hand surgery      17 years ago  . Ablastion    . Abdominal hysterectomy      reports that she has quit smoking. Her smoking use included Cigarettes. She started smoking about 19 months ago. She has a .32 pack-year smoking history. She does not have any smokeless tobacco history on file. She reports that she uses illicit drugs ("Crack" cocaine, Heroin, Methamphetamines, and Marijuana). She reports that she does not drink alcohol. family history includes Alcohol abuse in an other family member; COPD in her mother; Chronic fatigue in her mother; Diabetes in her mother; Glaucoma in her father; Hypertension in her mother; Osteoarthritis in her mother; Other in her father; Sleep apnea in her mother; Thyroid disease in her mother. No Known Allergies    Review of Systems  Constitutional: Positive for fever, chills and fatigue.  HENT: Positive for sore throat. Negative for congestion.   Respiratory: Negative for cough.   Gastrointestinal: Positive for nausea. Negative for vomiting.  Neurological: Positive for headaches.       Objective:   Physical Exam  Constitutional: She appears  well-developed and well-nourished.  HENT:  Right Ear: External ear normal.  Left Ear: External ear normal.  Posterior pharynx-erythema and yellowish exudate bilaterally  Neck: Neck supple.  Cardiovascular: Normal rate and regular rhythm.   Pulmonary/Chest: Effort normal and breath sounds normal. No respiratory distress. She has no wheezes. She has no rales.  Lymphadenopathy:    She has cervical adenopathy.          Assessment & Plan:  Exudative pharyngitis. Rapid strep positive. Amoxicillin 875 mg twice daily for 10 days. Continue ibuprofen. Stay well-hydrated. Follow-up when necessary

## 2015-06-07 NOTE — Progress Notes (Signed)
Pre visit review using our clinic review tool, if applicable. No additional management support is needed unless otherwise documented below in the visit note. 

## 2015-06-10 ENCOUNTER — Encounter: Payer: 59 | Attending: Endocrinology | Admitting: Dietician

## 2015-06-10 ENCOUNTER — Encounter: Payer: Self-pay | Admitting: Dietician

## 2015-06-10 DIAGNOSIS — E119 Type 2 diabetes mellitus without complications: Secondary | ICD-10-CM | POA: Diagnosis not present

## 2015-06-10 DIAGNOSIS — Z713 Dietary counseling and surveillance: Secondary | ICD-10-CM | POA: Insufficient documentation

## 2015-06-10 DIAGNOSIS — Z6841 Body Mass Index (BMI) 40.0 and over, adult: Secondary | ICD-10-CM | POA: Diagnosis not present

## 2015-06-10 NOTE — Progress Notes (Signed)
Diabetes Self-Management Education  Visit Type: First/Initial  Appt. Start Time: 1500 Appt. End Time: 1615  06/10/2015  Monique Nelson, identified by name and date of birth, is a 49 y.o. female with a diagnosis of Diabetes: Type 2.   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  She reports changing the way she is eating since weight increase in June.  She has lost 9 lbs since that time.  UBW 150 lbs in college.  She has become more mindful of her eating habits, eating what she likes when she is hungry, decreasing carbohydrate intake (she does not like most carbohydrates but fruit).  She does not like artificial sweteners and avoids.  She uses little added sugar.  "I eat when I'm hungry."  She is lactose intolerant.  She quit smoking about 2 1/2 years ago. She is motivated to manage her diabetes and wishes to reduce weight.  Wt Readings from Last 3 Encounters:  06/10/15 253 lb (114.76 kg)  06/07/15 253 lb (114.76 kg)  05/17/15 256 lb (116.121 kg)  04/01/15 262 lbs  TANITA  BODY COMP RESULTS 06/10/15 252 lbs   BMI (kg/m^2) 47.6   Fat Mass (lbs) 132   Fat Free Mass (lbs) 120   Total Body Water (lbs) 88    ASSESSMENT  Height  (1.549 m), weight 253 lb (114.76 kg), last menstrual period 12/15/2011. Body mass index is 47.83 kg/(m^2).      Diabetes Self-Management Education - 06/10/15 1654    Dietary Intake   Beverage(s) water, coffee with 1/2 tsp sugar, no artificial sweeteners   Exercise   Exercise Type Moderate (swimming / aerobic walking)   Patient Education   Previous Diabetes Education --  attended a [facility] class   Disease state  Definition of diabetes, type 1 and 2, and the diagnosis of diabetes   Nutrition management  Role of diet in the treatment of diabetes and the relationship between the three main macronutrients and blood glucose level;Carbohydrate counting   Physical activity and exercise  Role of exercise on  diabetes management, blood pressure control and cardiac health.   Monitoring Taught/discussed recording of test results and interpretation of SMBG.;Identified appropriate SMBG and/or A1C goals.;Daily foot exams;Yearly dilated eye exam   Individualized Goals (developed by patient)   Nutrition Follow meal plan discussed;General guidelines for healthy choices and portions discussed   Physical Activity Exercise 5-7 days per week;60 minutes per day   Medications take my medication as prescribed   Monitoring  test my blood glucose as discussed   Reducing Risk Other (comment)  continued mindful eating for weight reduction   Outcomes   Future DMSE 2 months   Program Status Completed      Individualized Plan for Diabetes Self-Management Training:   Learning Objective:  Patient will have a greater understanding of diabetes self-management. Patient education plan is to attend individual and/or group sessions per assessed needs and concerns.   Plan:   Patient Instructions  Continue the exercise habit! Continue to be mindful of what you eat and amounts.  When am I hungry?  When am I full? Consider Reading:    A Woman's Guide to Diabetes- a path to wellness by Thomos Lemons, MSW and Marda Stalker, MD (American Diabetes Association)  Intuitive Eating:  A revolutionary program that works by Roberto Scales, MS, RD  Reversing Diabetes by Lequita Asal, MD Calorie Brooke Dare app    Expected Outcomes:  Education material provided: A1C conversion sheet, Meal plan card and Snack sheet  If problems or questions, patient to contact team via:  Phone and Email  Future DSME appointment: 2 months

## 2015-06-10 NOTE — Patient Instructions (Signed)
Continue the exercise habit! Continue to be mindful of what you eat and amounts.  When am I hungry?  When am I full? Consider Reading:    A Woman's Guide to Diabetes- a path to wellness by Thomos Lemons, MSW and Marda Stalker, MD (American Diabetes Association)  Intuitive Eating:  A revolutionary program that works by Roberto Scales, MS, RD  Reversing Diabetes by Lequita Asal, MD Calorie Brooke Dare app

## 2015-07-02 ENCOUNTER — Other Ambulatory Visit (INDEPENDENT_AMBULATORY_CARE_PROVIDER_SITE_OTHER): Payer: 59

## 2015-07-02 ENCOUNTER — Other Ambulatory Visit: Payer: 59

## 2015-07-02 DIAGNOSIS — Z0000 Encounter for general adult medical examination without abnormal findings: Secondary | ICD-10-CM | POA: Diagnosis not present

## 2015-07-02 LAB — CBC WITH DIFFERENTIAL/PLATELET
BASOS PCT: 0.5 % (ref 0.0–3.0)
Basophils Absolute: 0.1 10*3/uL (ref 0.0–0.1)
EOS PCT: 1.4 % (ref 0.0–5.0)
Eosinophils Absolute: 0.2 10*3/uL (ref 0.0–0.7)
HEMATOCRIT: 40.7 % (ref 36.0–46.0)
HEMOGLOBIN: 13.1 g/dL (ref 12.0–15.0)
LYMPHS PCT: 43.4 % (ref 12.0–46.0)
Lymphs Abs: 5.4 10*3/uL — ABNORMAL HIGH (ref 0.7–4.0)
MCHC: 32.1 g/dL (ref 30.0–36.0)
MCV: 86.8 fl (ref 78.0–100.0)
Monocytes Absolute: 0.8 10*3/uL (ref 0.1–1.0)
Monocytes Relative: 6.3 % (ref 3.0–12.0)
Neutro Abs: 6 10*3/uL (ref 1.4–7.7)
Neutrophils Relative %: 48.4 % (ref 43.0–77.0)
Platelets: 386 10*3/uL (ref 150.0–400.0)
RBC: 4.69 Mil/uL (ref 3.87–5.11)
RDW: 16.2 % — AB (ref 11.5–15.5)
WBC: 12.3 10*3/uL — AB (ref 4.0–10.5)

## 2015-07-02 LAB — HEPATIC FUNCTION PANEL
ALT: 13 U/L (ref 0–35)
AST: 14 U/L (ref 0–37)
Albumin: 3.8 g/dL (ref 3.5–5.2)
Alkaline Phosphatase: 67 U/L (ref 39–117)
BILIRUBIN DIRECT: 0.1 mg/dL (ref 0.0–0.3)
TOTAL PROTEIN: 7.6 g/dL (ref 6.0–8.3)
Total Bilirubin: 0.2 mg/dL (ref 0.2–1.2)

## 2015-07-02 LAB — LIPID PANEL
CHOLESTEROL: 179 mg/dL (ref 0–200)
HDL: 36.2 mg/dL — ABNORMAL LOW (ref >39.00)
LDL CALC: 109 mg/dL — AB (ref 0–99)
NONHDL: 142.99
Total CHOL/HDL Ratio: 5
Triglycerides: 171 mg/dL — ABNORMAL HIGH (ref 0.0–149.0)
VLDL: 34.2 mg/dL (ref 0.0–40.0)

## 2015-07-02 LAB — BASIC METABOLIC PANEL
BUN: 17 mg/dL (ref 6–23)
CHLORIDE: 106 meq/L (ref 96–112)
CO2: 29 mEq/L (ref 19–32)
Calcium: 9.3 mg/dL (ref 8.4–10.5)
Creatinine, Ser: 0.77 mg/dL (ref 0.40–1.20)
GFR: 102.23 mL/min (ref >60.00)
Glucose, Bld: 72 mg/dL (ref 70–99)
POTASSIUM: 4 meq/L (ref 3.5–5.1)
SODIUM: 141 meq/L (ref 135–145)

## 2015-07-02 LAB — HEMOGLOBIN A1C: HEMOGLOBIN A1C: 6.2 % (ref 4.6–6.5)

## 2015-07-02 LAB — TSH: TSH: 2.53 u[IU]/mL (ref 0.35–4.50)

## 2015-07-06 NOTE — Addendum Note (Signed)
Addended by: Bonnye Fava on: 07/06/2015 08:13 AM   Modules accepted: Orders

## 2015-07-07 ENCOUNTER — Encounter: Payer: Self-pay | Admitting: Internal Medicine

## 2015-07-07 ENCOUNTER — Ambulatory Visit (INDEPENDENT_AMBULATORY_CARE_PROVIDER_SITE_OTHER): Payer: 59 | Admitting: Internal Medicine

## 2015-07-07 VITALS — BP 134/84 | Temp 98.6°F | Ht 61.5 in | Wt 253.5 lb

## 2015-07-07 DIAGNOSIS — D72829 Elevated white blood cell count, unspecified: Secondary | ICD-10-CM | POA: Diagnosis not present

## 2015-07-07 DIAGNOSIS — Z79890 Hormone replacement therapy (postmenopausal): Secondary | ICD-10-CM

## 2015-07-07 DIAGNOSIS — E785 Hyperlipidemia, unspecified: Secondary | ICD-10-CM | POA: Diagnosis not present

## 2015-07-07 DIAGNOSIS — Z0000 Encounter for general adult medical examination without abnormal findings: Secondary | ICD-10-CM | POA: Diagnosis not present

## 2015-07-07 DIAGNOSIS — E119 Type 2 diabetes mellitus without complications: Secondary | ICD-10-CM | POA: Diagnosis not present

## 2015-07-07 DIAGNOSIS — G4733 Obstructive sleep apnea (adult) (pediatric): Secondary | ICD-10-CM

## 2015-07-07 NOTE — Progress Notes (Signed)
Pre visit review using our clinic review tool, if applicable. No additional management support is needed unless otherwise documented below in the visit note.  Chief Complaint  Patient presents with  . Annual Exam    HPI: Patient  Monique Nelson  49 y.o. comes in today for Preventive Health Care visit   Has strep in [place] rx with amox . Better from that. 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  4.0   And   ft at home  .   Sees Dr. Dwyane Dee for her diabetes taking Victoza 1.2 with some success. To see a nutritionist lifestyle intervention to try to control the cholesterol at this time. Not smoking no alcohol recovering doing well. Is trying to lose weight and aware. Sleeps okay on CPAP. She has a copy of her labs done from her work from January of last year. At that point her blood pressure is 130/80. HDL 36 total cholesterol 197 triglycerides 171 hemoglobin A1c 6.4 LDL calculated 127 ratio 5.5. Health Maintenance  Topic Date Due  . PNEUMOCOCCAL POLYSACCHARIDE VACCINE (1) 12/07/1967  . OPHTHALMOLOGY EXAM  12/07/1975  . URINE MICROALBUMIN  12/07/1975  . HIV Screening  12/06/1980  . PAP SMEAR  07/06/2012  . FOOT EXAM  01/29/2015  . INFLUENZA VACCINE  07/06/2016 (Originally 05/17/2015)  . HEMOGLOBIN A1C  12/30/2015  . TETANUS/TDAP  07/06/2016   Health Maintenance Review LIFESTYLE:  Exercise:    Gym.    Tobacco/ETS: no Alcohol: no Sugar beverages:  Not really . ocass  Sleep:  Ok on cpap .   ROS:  GEN/ HEENT: No fever, significant weight changes sweats headaches vision problems hearing changes, CV/ PULM; No chest pain shortness of breath cough, syncope,edema  change in exercise tolerance. GI /GU: No adominal pain, vomiting, change in bowel habits. No blood in the stool. No significant GU symptoms. SKIN/HEME: ,no acute skin rashes suspicious lesions or bleeding. No lymphadenopathy, nodules, masses.  NEURO/ PSYCH:  No neurologic  signs such as weakness numbness. No depression anxiety. IMM/ Allergy: No unusual infections.  Allergy .   REST of 12 system review negative except as per HPI   Past Medical History  Diagnosis Date  . Vertigo     intermittent infrequent  . Drug addiction     recovering  . Hx of varicella   . Substance abuse in remission 13 years ago     cocaine heroiin  met   support via ADP  . Ex-smoker 2009  . History of fracture of wrist 17 years ago    has plate and pins right   . Hx of hysterectomy     bleeding and cysts  . Diabetes mellitus without complication     Past Surgical History  Procedure Laterality Date  . Rt hand surgery      17 years ago  . Ablastion    . Abdominal hysterectomy      Family History  Problem Relation Age of Onset  . COPD Mother   . Diabetes Mother   . Hypertension Mother   . Sleep apnea Mother     and sister   . Thyroid disease Mother   . Chronic fatigue Mother   . Glaucoma Father   . Other Father     prostate disease  . Osteoarthritis Mother     knee replacement   . Alcohol abuse      drug parent  Social History   Social History  . Marital Status: Single    Spouse Name: N/A  . Number of Children: 1  . Years of Education: N/A   Occupational History  .  [employer]   Social History Main Topics  . Smoking status: Former Smoker -- 0.04 packs/day for 8 years    Types: Cigarettes    Start date: 10/21/2013  . Smokeless tobacco: None     Comment: off and on x 8 yrs  . Alcohol Use: No  . Drug Use: Yes    Special: "Crack" cocaine, Heroin, Methamphetamines, Marijuana     Comment: History- 14 years clean-11/21/13  . Sexual Activity: Not Asked   Other Topics Concern  . 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 BD PEN NEEDLE NANO U/F 32G X 4 MM MISC USE 1 PEN NEEDLE PER DAY WITH VICTOZA 50 each 3  . estradiol (ESTRACE) 1 MG tablet TAKE 1 TABLET(1 MG) BY MOUTH DAILY 30 tablet 5  . glucose blood (ONE TOUCH ULTRA TEST) test strip Use as instructed to check blood sugar once a day alternating meals dx code 790.29 50 each 3  . Insulin Pen Needle (BD PEN NEEDLE NANO U/F) 32G X 4 MM MISC Use one pen needle per day with Victoza 30 each 3  . Liraglutide (VICTOZA) 18 MG/3ML SOPN Inject 1.8 into skin once daily 6 mL 1  . Liraglutide 18 MG/3ML SOPN Inject 1.2 mg into the skin daily. 2 pen 0  . ONETOUCH DELICA LANCETS FINE MISC Use to obtain a blood specimen once a day dx code 790.29 100 each 1   No facility-administered medications prior to visit.     EXAM:  BP 134/84 mmHg  Temp(Src) 98.6 F (37 C) (Oral)  Ht 5' 1.5" (1.562 m)  Wt 253 lb 8 oz (114.987 kg)  BMI 47.13 kg/m2  LMP 12/15/2011  Body mass index is 47.13 kg/(m^2).  Physical Exam: Vital signs reviewed HYQ:MVHQ is a well-developed well-nourished alert cooperative    who appearsr stated age in no acute distress.  HEENT: normocephalic atraumatic , Eyes: PERRL EOM's full, conjunctiva clear, Nares: paten,t no deformity discharge or tenderness., Ears: no deformity EAC's clear TMs with normal landmarks. Mouth: clear OP, no lesions, edema.  Moist mucous membranes. Dentition in adequate repair. NECK: supple without masses, thyromegaly or bruits. CHEST/PULM:  Clear to auscultation and percussion breath sounds equal no wheeze , rales or rhonchi. No chest wall deformities or tenderness. Breast no nodules or discharge. CV: PMI is nondisplaced, S1 S2 no gallops, murmurs, rubs. Peripheral pulses are full without delay.No JVD .  ABDOMEN: Bowel sounds normal nontender  No guard or rebound, no hepato splenomegal no CVA tenderness.  No hernia. Obvious Extremtities:  No clubbing  cyanosis or edema, no acute joint swelling or redness no focal atrophy NEURO:  Oriented x3, cranial nerves 3-12 appear to be intact, no obvious focal weakness,gait within normal limits no abnormal reflexes or asymmetrical SKIN: No acute rashes normal turgor, color, no bruising or petechiae. PSYCH: Oriented, good eye contact, no obvious  depression anxiety, cognition and judgment appear normal. LN: no cervical axillary inguinal adenopathy  Diabetic Foot Exam - Simple   Simple Foot Form  Diabetic Foot exam was performed with the following findings:  Yes 07/07/2015 11:37 AM  Visual Inspection  No deformities, no ulcerations, no other skin breakdown bilaterally:  Yes  Sensation Testing  Intact to touch and monofilament testing bilaterally:  Yes  Pulse Check  Posterior Tibialis and Dorsalis pulse intact bilaterally:  Yes  Comments       Lab Results  Component Value Date   WBC 12.3* 07/02/2015   HGB 13.1 07/02/2015   HCT 40.7 07/02/2015   PLT 386.0 07/02/2015   GLUCOSE 72 07/02/2015   CHOL 179 07/02/2015   TRIG 171.0* 07/02/2015   HDL 36.20* 07/02/2015   LDLCALC 109* 07/02/2015   ALT 13 07/02/2015   AST 14 07/02/2015   NA 141 07/02/2015   K 4.0 07/02/2015   CL 106 07/02/2015   CREATININE 0.77 07/02/2015   BUN 17 07/02/2015   CO2 29 07/02/2015   TSH 2.53 07/02/2015   HGBA1C 6.2 07/02/2015   Health Maintenance Due  Topic Date Due  . PNEUMOCOCCAL POLYSACCHARIDE VACCINE (1) 12/07/1967  . OPHTHALMOLOGY EXAM  12/07/1975  . URINE MICROALBUMIN  12/07/1975  . HIV Screening  12/06/1980  . PAP SMEAR  07/06/2012  . FOOT EXAM  01/29/2015   Wt Readings from Last 3 Encounters:  07/07/15 253 lb 8 oz (114.987 kg)  06/10/15 253 lb (114.76 kg)  06/07/15 253 lb (114.76 kg)     ASSESSMENT AND PLAN:  Discussed the following assessment and plan:  Visit for preventive health examination - revewied hcm parameters  Elevated WBC count - not alarming  nl  follow   Diabetes mellitus  without complication - per dr Dwyane Dee  controlled   Hyperlipidemia - lsi  improved some to see nutritionist  Hormone replacement therapy (HRT) - total hystrectomy fibroids benign reason  OSA (obstructive sleep apnea) - under rx   Morbid obesity - working on it  Probably had HIV test when she was in rehabilitation. No high-risk exposure since then but can do HIV antibody screen at next lab. Patient Care Team: Burnis Medin, MD as PCP - General (Internal Medicine) Elayne Snare, MD as Consulting Physician (Endocrinology) Rigoberto Noel, MD as Consulting Physician (Pulmonary Disease) Patient Instructions  Continue  Working on ls intervention healthy eating and exercise .  Healthy lifestyle includes : At least 150 minutes of exercise weeks  , weight at healthy levels, which is usually   BMI 19-25. Avoid trans fats and processed foods;  Increase fresh fruits and veges to 5 servings per day. And avoid sweet beverages including tea and juice. Mediterranean diet with olive oil and nuts have been noted to be heart and brain healthy . Avoid tobacco products . Limit  alcohol to  7 per week for women and 14 servings for men.  Get adequate sleep . Wear seat belts . Don't text and drive .   LIPIDS are improving continue. Consider pneumonia vaccine  Pneumovax  and prevnar 13  Uncertain why the WBC mildly elevated  But should have yearly blood count .  Continue fu with dr Dwyane Dee   Can get hiv screen with next blood test if needed .Marland Kitchen  Ask for order.   Contact us for colonoscopy referral when you turn 50 years. Mammogram at the [facility].       [name]. Panosh M.D.

## 2015-07-07 NOTE — Patient Instructions (Signed)
Continue  Working on ls intervention healthy eating and exercise .  Healthy lifestyle includes : At least 150 minutes of exercise weeks  , weight at healthy levels, which is usually   BMI 19-25. Avoid trans fats and processed foods;  Increase fresh fruits and veges to 5 servings per day. And avoid sweet beverages including tea and juice. Mediterranean diet with olive oil and nuts have been noted to be heart and brain healthy . Avoid tobacco products . Limit  alcohol to  7 per week for women and 14 servings for men.  Get adequate sleep . Wear seat belts . Don't text and drive .   LIPIDS are improving continue. Consider pneumonia vaccine  Pneumovax  and prevnar 13  Uncertain why the WBC mildly elevated  But should have yearly blood count .  Continue fu with dr Lucianne Muss   Can get hiv screen with next blood test if needed .Marland Kitchen  Ask for order.   Contact us for colonoscopy referral when you turn 50 years. [facility].

## 2015-07-15 ENCOUNTER — Other Ambulatory Visit: Payer: 59

## 2015-07-19 ENCOUNTER — Ambulatory Visit: Payer: 59 | Admitting: Dietician

## 2015-07-19 ENCOUNTER — Ambulatory Visit: Payer: 59 | Admitting: Endocrinology

## 2015-07-30 ENCOUNTER — Other Ambulatory Visit: Payer: 59

## 2015-08-04 ENCOUNTER — Telehealth: Payer: Self-pay | Admitting: Endocrinology

## 2015-08-04 ENCOUNTER — Ambulatory Visit: Payer: 59 | Admitting: Endocrinology

## 2015-08-04 DIAGNOSIS — Z0289 Encounter for other administrative examinations: Secondary | ICD-10-CM

## 2015-08-04 NOTE — Telephone Encounter (Signed)
Needs appointment rescheduled as soon as possible 

## 2015-08-04 NOTE — Telephone Encounter (Signed)
Patient no showed today's appt. Please advise on how to follow up. °A. No follow up necessary. °B. Follow up urgent. Contact patient immediately. °C. Follow up necessary. Contact patient and schedule visit in ___ days. °D. Follow up advised. Contact patient and schedule visit in ____weeks. ° °

## 2015-08-05 ENCOUNTER — Encounter: Payer: Self-pay | Admitting: *Deleted

## 2015-08-05 NOTE — Telephone Encounter (Signed)
Letter mailed

## 2015-08-31 ENCOUNTER — Ambulatory Visit: Payer: 59 | Admitting: Family Medicine

## 2015-11-22 ENCOUNTER — Inpatient Hospital Stay (HOSPITAL_COMMUNITY)
Admission: AD | Admit: 2015-11-22 | Discharge: 2015-11-22 | Disposition: A | Discharge status: Home / Self Care | Payer: 59 | Source: Ambulatory Visit | Attending: Obstetrics and Gynecology | Admitting: Obstetrics and Gynecology

## 2015-11-22 ENCOUNTER — Encounter (HOSPITAL_COMMUNITY): Payer: Self-pay

## 2015-11-22 DIAGNOSIS — Z833 Family history of diabetes mellitus: Secondary | ICD-10-CM | POA: Diagnosis not present

## 2015-11-22 DIAGNOSIS — Z87891 Personal history of nicotine dependence: Secondary | ICD-10-CM | POA: Insufficient documentation

## 2015-11-22 DIAGNOSIS — A499 Bacterial infection, unspecified: Secondary | ICD-10-CM | POA: Diagnosis not present

## 2015-11-22 DIAGNOSIS — B9689 Other specified bacterial agents as the cause of diseases classified elsewhere: Secondary | ICD-10-CM | POA: Diagnosis not present

## 2015-11-22 DIAGNOSIS — Z8349 Family history of other endocrine, nutritional and metabolic diseases: Secondary | ICD-10-CM | POA: Diagnosis not present

## 2015-11-22 DIAGNOSIS — Z794 Long term (current) use of insulin: Secondary | ICD-10-CM | POA: Insufficient documentation

## 2015-11-22 DIAGNOSIS — Z90710 Acquired absence of both cervix and uterus: Secondary | ICD-10-CM | POA: Diagnosis not present

## 2015-11-22 DIAGNOSIS — E119 Type 2 diabetes mellitus without complications: Secondary | ICD-10-CM | POA: Diagnosis not present

## 2015-11-22 DIAGNOSIS — N760 Acute vaginitis: Secondary | ICD-10-CM

## 2015-11-22 DIAGNOSIS — Z8249 Family history of ischemic heart disease and other diseases of the circulatory system: Secondary | ICD-10-CM | POA: Insufficient documentation

## 2015-11-22 DIAGNOSIS — L0231 Cutaneous abscess of buttock: Secondary | ICD-10-CM | POA: Diagnosis not present

## 2015-11-22 DIAGNOSIS — Z825 Family history of asthma and other chronic lower respiratory diseases: Secondary | ICD-10-CM | POA: Insufficient documentation

## 2015-11-22 DIAGNOSIS — R1030 Lower abdominal pain, unspecified: Secondary | ICD-10-CM | POA: Diagnosis present

## 2015-11-22 LAB — URINE MICROSCOPIC-ADD ON: Bacteria, UA: NONE SEEN

## 2015-11-22 LAB — URINALYSIS, ROUTINE W REFLEX MICROSCOPIC
Bilirubin Urine: NEGATIVE
GLUCOSE, UA: NEGATIVE mg/dL
KETONES UR: NEGATIVE mg/dL
LEUKOCYTES UA: NEGATIVE
Nitrite: NEGATIVE
PROTEIN: NEGATIVE mg/dL
Specific Gravity, Urine: 1.025 (ref 1.005–1.030)
pH: 5.5 (ref 5.0–8.0)

## 2015-11-22 LAB — WET PREP, GENITAL
SPERM: NONE SEEN
Trich, Wet Prep: NONE SEEN
Yeast Wet Prep HPF POC: NONE SEEN

## 2015-11-22 MED ORDER — METRONIDAZOLE 500 MG PO TABS: 500.0000 mg | ORAL_TABLET | Freq: Two times a day (BID) | ORAL | Status: DC

## 2015-11-22 NOTE — MAU Note (Signed)
Pt states that 3 days ago she began having some RLQ pain that is throbbing and sore-constant. Rates 10/10. Took aleve this morning, did not help. States that she has some vaginal discharge-denies odor or itching. Having complaints of sore, hard spot on right butt cheek that itches and burns. Had hysterectomy in 2013 by Dr. Henderson Cloud.

## 2015-11-22 NOTE — MAU Provider Note (Signed)
History     CSN: [id number]  Arrival date and time: 11/22/15 3976   First Provider Initiated Contact with Patient 11/22/15 2044      Chief Complaint  Patient presents with  . Abdominal Pain  . Vaginal Discharge   HPI [name] is a 50 y.o. who presents to MAU today with complaint of vaginal discharge and lower abdominal pain. She denies any odor, itching, irritation or rash. She states symptoms x 3 days. She states associated suprapubic cramping rated at 8/10 and unrelieved with Aleve. She denies vaginal bleeding, UTI sym[ptoms, fever or recent intercourse.   OB History    No data available      Past Medical History  Diagnosis Date  . Vertigo     intermittent infrequent  . Drug addiction ([place])     recovering  . Hx of varicella   . Substance abuse in remission 13 years ago     cocaine heroiin  met   support via ADP  . Ex-smoker 2009  . History of fracture of wrist 17 years ago    has plate and pins right   . Hx of hysterectomy     bleeding and cysts  . Diabetes mellitus without complication [facility])     Past Surgical History  Procedure Laterality Date  . Rt hand surgery      17 years ago  . Ablastion    . Abdominal hysterectomy      Family History  Problem Relation Age of Onset  . COPD Mother   . Diabetes Mother   . Hypertension Mother   . Sleep apnea Mother     and sister   . Thyroid disease Mother   . Chronic fatigue Mother   . Glaucoma Father   . Other Father     prostate disease  . Osteoarthritis Mother     knee replacement   . Alcohol abuse      drug parent    Social History  Substance Use Topics  . Smoking status: Former Smoker -- 0.04 packs/day for 8 years    Types: Cigarettes    Start date: 10/21/2013  . Smokeless tobacco: None     Comment: off and on x 8 yrs  . Alcohol Use: No    Allergies: No Known Allergies  Prescriptions prior to admission  Medication Sig Dispense Refill Last Dose  . estradiol (ESTRACE) 1 MG tablet Take  1 mg by mouth daily.   11/22/2015 at Unknown time  . Liraglutide 18 MG/3ML SOPN Inject 1.2 mg into the skin daily. 2 pen 0 11/22/2015 at Unknown time  . BD PEN NEEDLE NANO U/F 32G X 4 MM MISC USE 1 PEN NEEDLE PER DAY WITH VICTOZA 50 each 3 Taking  . glucose blood (ONE TOUCH ULTRA TEST) test strip Use as instructed to check blood sugar once a day alternating meals dx code 790.29 50 each 3 Taking  . Insulin Pen Needle (BD PEN NEEDLE NANO U/F) 32G X 4 MM MISC Use one pen needle per day with Victoza 30 each 3 Taking  . Liraglutide (VICTOZA) 18 MG/3ML SOPN Inject 1.8 into skin once daily (Patient not taking: Reported on 11/22/2015) 6 mL 1 Not Taking at Unknown time  . ONETOUCH DELICA LANCETS FINE MISC Use to obtain a blood specimen once a day dx code 790.29 100 each 1 Taking  . [DISCONTINUED] estradiol (ESTRACE) 1 MG tablet TAKE 1 TABLET(1 MG) BY MOUTH DAILY (Patient not taking: Reported on 11/22/2015) 30  tablet 5 Not Taking at Unknown time    Review of Systems  Constitutional: Negative for fever and malaise/fatigue.  Gastrointestinal: Positive for abdominal pain. Negative for nausea, vomiting, diarrhea and constipation.  Genitourinary: Negative for dysuria, urgency and frequency.       + vaginal discharge Neg - vaginal bleeding   Physical Exam   Blood pressure 139/73, pulse 94, temperature 98.2 F (36.8 C), temperature source Oral, resp. rate 20, height 5' 1.5" (1.562 m), weight 261 lb 12.8 oz (118.752 kg), last menstrual period 12/15/2011, SpO2 99 %.  Physical Exam  Nursing note and vitals reviewed. Constitutional: She is oriented to person, place, and time. She appears well-developed and well-nourished. No distress.  HENT:  Head: Normocephalic and atraumatic.  Cardiovascular: Normal rate.   Respiratory: Effort normal.  GI: Soft. She exhibits no distension and no mass. There is tenderness (mild suprapubic tenderness to palpation). There is no rebound and no guarding.  Genitourinary: No bleeding in  the vagina. Vaginal discharge (moderate amount of thick, white discharge noted) found.  Cervix, uterus surgically absent  Neurological: She is alert and oriented to person, place, and time.  Skin: Skin is warm and dry. No erythema.     Psychiatric: She has a normal mood and affect.    Results for orders placed or performed during the hospital encounter of 11/22/15 (from the past 24 hour(s))  Urinalysis, Routine w reflex microscopic (not at [facility])     Status: Abnormal   Collection Time: 11/22/15  7:55 PM  Result Value Ref Range   Color, Urine YELLOW YELLOW   APPearance HAZY (A) CLEAR   Specific Gravity, Urine 1.025 1.005 - 1.030   pH 5.5 5.0 - 8.0   Glucose, UA NEGATIVE NEGATIVE mg/dL   Hgb urine dipstick MODERATE (A) NEGATIVE   Bilirubin Urine NEGATIVE NEGATIVE   Ketones, ur NEGATIVE NEGATIVE mg/dL   Protein, ur NEGATIVE NEGATIVE mg/dL   Nitrite NEGATIVE NEGATIVE   Leukocytes, UA NEGATIVE NEGATIVE  Urine microscopic-add on     Status: Abnormal   Collection Time: 11/22/15  7:55 PM  Result Value Ref Range   Squamous Epithelial / LPF 0-5 (A) NONE SEEN   WBC, UA 0-5 0 - 5 WBC/hpf   RBC / HPF 0-5 0 - 5 RBC/hpf   Bacteria, UA NONE SEEN NONE SEEN  Wet prep, genital     Status: Abnormal   Collection Time: 11/22/15  8:50 PM  Result Value Ref Range   Yeast Wet Prep HPF POC NONE SEEN NONE SEEN   Trich, Wet Prep NONE SEEN NONE SEEN   Clue Cells Wet Prep HPF POC PRESENT (A) NONE SEEN   WBC, Wet Prep HPF POC FEW (A) NONE SEEN   Sperm NONE SEEN     MAU Course  Procedures None  MDM Patient has had hysterectomy UA today Wet prep today Urine culture pending  Assessment and Plan  A: Bacterial vaginosis Abscess of the buttocks  P: Discharge home Rx for Flagyl given to patient Warning signs for worsening condition discussed Discussed used of sitz baths and warm compresses for affected area on the buttocks Patient advised to follow-up with PCP if symptoms persist or  worsen Patient may return to MAU as needed or if her condition were to change or worsen   Luvenia Redden, PA-C  11/22/2015, 9:24 PM

## 2015-11-22 NOTE — Discharge Instructions (Signed)
Bacterial Vaginosis Bacterial vaginosis is an infection of the vagina. It happens when too many germs (bacteria) grow in the vagina. Having this infection puts you at risk for getting other infections from sex. Treating this infection can help lower your risk for other infections, such as:   Chlamydia.  Gonorrhea.  HIV.  Herpes. HOME CARE  Take your medicine as told by your doctor.  Finish your medicine even if you start to feel better.  Tell your sex partner that you have an infection. They should see their doctor for treatment.  During treatment:  Avoid sex or use condoms correctly.  Do not douche.  Do not drink alcohol unless your doctor tells you it is ok.  Do not breastfeed unless your doctor tells you it is ok. GET HELP IF:  You are not getting better after 3 days of treatment.  You have more grey fluid (discharge) coming from your vagina than before.  You have more pain than before.  You have a fever. MAKE SURE YOU:   Understand these instructions.  Will watch your condition.  Will get help right away if you are not doing well or get worse.   This information is not intended to replace advice given to you by your health care provider. Make sure you discuss any questions you have with your health care provider.   Document Released: 07/11/2008 Document Revised: 10/23/2014 Document Reviewed: 05/14/2013 Elsevier Interactive Patient Education 2016 Else[employer] Inc[employer] Abscess An abscess (boil or furuncle) is an infected area on or under the skin. This area is filled with yellowish-white fluid (pus) and other material (debris). HOME CARE   Only take medicines as told by your doctor.  If you were given antibiotic medicine, take it as directed. Finish the medicine even if you start to feel better.  If gauze is used, follow your doctor's directions for changing the gauze.  To avoid spreading the infection:  Keep your abscess covered with a bandage.  Wash your  hands well.  Do not share personal care items, towels, or whirlpools with others.  Avoid skin contact with others.  Keep your skin and clothes clean around the abscess.  Keep all doctor visits as told. GET HELP RIGHT AWAY IF:   You have more pain, puffiness (swelling), or redness in the wound site.  You have more fluid or blood coming from the wound site.  You have muscle aches, chills, or you feel sick.  You have a fever. MAKE SURE YOU:   Understand these instructions.  Will watch your condition.  Will get help right away if you are not doing well or get worse.   This information is not intended to replace advice given to you by your health care provider. Make sure you discuss any questions you have with your health care provider.   Document Released: 03/20/2008 Document Revised: 04/02/2012 Document Reviewed: 12/16/2011 Elsevier Interactive Patient Education [employer]

## 2015-11-23 ENCOUNTER — Telehealth: Payer: Self-pay | Admitting: Internal Medicine

## 2015-11-23 LAB — URINE CULTURE

## 2015-11-23 NOTE — Telephone Encounter (Signed)
[facility] - Client TELEPHONE ADVICE RECORD [facility]  Patient Name: Monique Nelson  DOB: 1966/02/23    Initial Comment Caller states she went to ER last night due to pain. Frequent urination, painful. Heaviness in pelvis. Dx with baterial infection, RX flagyl, will that help with painful urination?    Nurse Assessment  Nurse: Vickey Sages, RN, Jacquilin Date/Time (Eastern Time): 11/23/2015 11:45:01 AM  Confirm and document reason for call. If symptomatic, describe symptoms. You must click the next button to save text entered. ---Caller states she went to ER last night due to pain. Frequent urination, painful. Heaviness in pelvis. Dx with bacterial infection, RX flagyl, will that help with painful urination? - Caller states she would rather have her UTI symptoms treated vs the bacterial infection  Has the patient traveled out of the country within the last 30 days? ---No  Does the patient have any new or worsening symptoms? ---Yes  Will a triage be completed? ---Yes  Related visit to physician within the last 2 weeks? ---Yes  Does the PT have any chronic conditions? (i.e. diabetes, asthma, etc.) ---No  Did the patient indicate they were pregnant? ---No  Is this a behavioral health or substance abuse call? ---No     Guidelines    Guideline Title Affirmed Question Affirmed Notes  Urinary Tract Infection on Antibiotic Follow-up Call - Female [1] SEVERE pain (e.g., excruciating) AND [2] no improvement 2 hours after pain medications    Final Disposition User   See Physician within 4 Hours (or PCP triage) Vickey Sages, RN, Jacquilin    Comments  PLEASE CALL PATIENT REGARDING UTI   Referrals  REFERRED TO PCP OFFICE       Called office and spoke with Sue Lush regarding a call back to the patient

## 2015-11-23 NOTE — Telephone Encounter (Signed)
Ok to call in pyridium?

## 2015-11-24 ENCOUNTER — Telehealth: Payer: Self-pay | Admitting: Family Medicine

## 2015-11-24 MED ORDER — PHENAZOPYRIDINE HCL 200 MG PO TABS: 200.0000 mg | ORAL_TABLET | Freq: Three times a day (TID) | ORAL | Status: DC | PRN

## 2015-11-24 NOTE — Telephone Encounter (Signed)
Story  Doesn't make clinical sense  She can  Get otc pyridum  Or we can send in pyridium  1 potid as needed disp 15   And if not better should have fu

## 2015-11-24 NOTE — Telephone Encounter (Signed)
Monique Headings, MD at 11/24/2015 9:32 AM    Status: Signed      Expand All Collapse All    Story Doesn't make clinical sense  She can Get otc pyridum Or we can send in pyridium  1 potid as needed disp 15  And if not better should have fu     See [facility] Note

## 2015-11-24 NOTE — Telephone Encounter (Signed)
Spoke to the pt.  She agreed to pyridium.  Sent to the pharmacy by e-scribe.  Notified pt should help until antibiotics have a chance to become fully effective.  She will call back if needed.

## 2015-11-24 NOTE — Telephone Encounter (Signed)
Left a message for a return call.

## 2015-12-03 ENCOUNTER — Encounter (HOSPITAL_COMMUNITY): Payer: Self-pay | Admitting: Emergency Medicine

## 2015-12-03 ENCOUNTER — Emergency Department (HOSPITAL_COMMUNITY)
Admission: EM | Admit: 2015-12-03 | Discharge: 2015-12-03 | Disposition: A | Discharge status: Home / Self Care | Payer: 59 | Attending: Physician Assistant | Admitting: Physician Assistant

## 2015-12-03 DIAGNOSIS — Z79899 Other long term (current) drug therapy: Secondary | ICD-10-CM | POA: Insufficient documentation

## 2015-12-03 DIAGNOSIS — Z794 Long term (current) use of insulin: Secondary | ICD-10-CM | POA: Insufficient documentation

## 2015-12-03 DIAGNOSIS — E119 Type 2 diabetes mellitus without complications: Secondary | ICD-10-CM | POA: Diagnosis not present

## 2015-12-03 DIAGNOSIS — R112 Nausea with vomiting, unspecified: Secondary | ICD-10-CM | POA: Diagnosis present

## 2015-12-03 DIAGNOSIS — Z8781 Personal history of (healed) traumatic fracture: Secondary | ICD-10-CM | POA: Diagnosis not present

## 2015-12-03 DIAGNOSIS — Z87891 Personal history of nicotine dependence: Secondary | ICD-10-CM | POA: Insufficient documentation

## 2015-12-03 DIAGNOSIS — A084 Viral intestinal infection, unspecified: Secondary | ICD-10-CM | POA: Diagnosis not present

## 2015-12-03 DIAGNOSIS — Z90710 Acquired absence of both cervix and uterus: Secondary | ICD-10-CM | POA: Insufficient documentation

## 2015-12-03 LAB — CBC
HCT: 40.1 % (ref 36.0–46.0)
Hemoglobin: 13.3 g/dL (ref 12.0–15.0)
MCH: 28.3 pg (ref 26.0–34.0)
MCHC: 33.2 g/dL (ref 30.0–36.0)
MCV: 85.3 fL (ref 78.0–100.0)
PLATELETS: 370 10*3/uL (ref 150–400)
RBC: 4.7 MIL/uL (ref 3.87–5.11)
RDW: 15.4 % (ref 11.5–15.5)
WBC: 9.9 10*3/uL (ref 4.0–10.5)

## 2015-12-03 LAB — COMPREHENSIVE METABOLIC PANEL
ALBUMIN: 3.8 g/dL (ref 3.5–5.0)
ALT: 25 U/L (ref 14–54)
AST: 21 U/L (ref 15–41)
Alkaline Phosphatase: 62 U/L (ref 38–126)
Anion gap: 10 (ref 5–15)
BUN: 17 mg/dL (ref 6–20)
CHLORIDE: 106 mmol/L (ref 101–111)
CO2: 23 mmol/L (ref 22–32)
CREATININE: 0.81 mg/dL (ref 0.44–1.00)
Calcium: 8.8 mg/dL — ABNORMAL LOW (ref 8.9–10.3)
GFR calc non Af Amer: >60 mL/min (ref >60)
Glucose, Bld: 129 mg/dL — ABNORMAL HIGH (ref 65–99)
Potassium: 3.6 mmol/L (ref 3.5–5.1)
Sodium: 139 mmol/L (ref 135–145)
Total Bilirubin: 0.8 mg/dL (ref 0.3–1.2)
Total Protein: 8.4 g/dL — ABNORMAL HIGH (ref 6.5–8.1)

## 2015-12-03 LAB — URINALYSIS, ROUTINE W REFLEX MICROSCOPIC
Bilirubin Urine: NEGATIVE
GLUCOSE, UA: NEGATIVE mg/dL
Ketones, ur: NEGATIVE mg/dL
Leukocytes, UA: NEGATIVE
Nitrite: NEGATIVE
PH: 5.5 (ref 5.0–8.0)
PROTEIN: NEGATIVE mg/dL
SPECIFIC GRAVITY, URINE: 1.024 (ref 1.005–1.030)

## 2015-12-03 LAB — URINE MICROSCOPIC-ADD ON
BACTERIA UA: NONE SEEN
WBC, UA: NONE SEEN WBC/hpf (ref 0–5)

## 2015-12-03 LAB — CBG MONITORING, ED: Glucose-Capillary: 127 mg/dL — ABNORMAL HIGH (ref 65–99)

## 2015-12-03 LAB — LIPASE, BLOOD: LIPASE: 23 U/L (ref 11–51)

## 2015-12-03 MED ORDER — ACETAMINOPHEN 160 MG/5ML PO SOLN
650.0000 mg | Freq: Once | ORAL | Status: AC
  Administered 2015-12-03: 650 mg via ORAL
  Filled 2015-12-03: qty 20.3

## 2015-12-03 MED ORDER — ONDANSETRON HCL 4 MG PO TABS: 4.0000 mg | ORAL_TABLET | Freq: Three times a day (TID) | ORAL | Status: DC | PRN

## 2015-12-03 MED ORDER — ONDANSETRON HCL 4 MG/2ML IJ SOLN
4.0000 mg | Freq: Once | INTRAMUSCULAR | Status: AC
  Administered 2015-12-03: 4 mg via INTRAVENOUS
  Filled 2015-12-03: qty 2

## 2015-12-03 MED ORDER — SODIUM CHLORIDE 0.9 % IV BOLUS (SEPSIS)
1000.0000 mL | Freq: Once | INTRAVENOUS | Status: AC
  Administered 2015-12-03: 1000 mL via INTRAVENOUS

## 2015-12-03 MED ORDER — ONDANSETRON HCL 4 MG PO TABS
4.0000 mg | ORAL_TABLET | Freq: Once | ORAL | Status: AC
  Administered 2015-12-03: 4 mg via ORAL
  Filled 2015-12-03: qty 1

## 2015-12-03 NOTE — ED Provider Notes (Signed)
CSN: [id number]     Arrival date & time 12/03/15  1638 History   First MD Initiated Contact with Patient 12/03/15 1954     Chief Complaint  Patient presents with  . Emesis     (Consider location/radiation/quality/duration/timing/severity/associated sxs/prior Treatment) HPI   Patient is a 50 year old female presenting with nausea vomiting for the last day. Patient was caring for her grandson with similar thing couple days ago. She's had nausea vomiting and diarrhea. She reports she's been having trouble keeping food down. Patient had no fever. Patient recently had a urinary tract infection for which she's been treated with antibiotics recently.  No abdominal pain.    Past Medical History  Diagnosis Date  . Vertigo     intermittent infrequent  . Drug addiction ([place])     recovering  . Hx of varicella   . Substance abuse in remission 13 years ago     cocaine heroiin  met   support via [employer]  . Ex-smoker 2009  . History of fracture of wrist 17 years ago    has plate and pins right   . Hx of hysterectomy     bleeding and cysts  . Diabetes mellitus without complication [facility])    Past Surgical History  Procedure Laterality Date  . Rt hand surgery      17 years ago  . Ablastion    . Abdominal hysterectomy     Family History  Problem Relation Age of Onset  . COPD Mother   . Diabetes Mother   . Hypertension Mother   . Sleep apnea Mother     and sister   . Thyroid disease Mother   . Chronic fatigue Mother   . Glaucoma Father   . Other Father     prostate disease  . Osteoarthritis Mother     knee replacement   . Alcohol abuse      drug parent   Social History  Substance Use Topics  . Smoking status: Former Smoker -- 0.04 packs/day for 8 years    Types: Cigarettes    Start date: 10/21/2013  . Smokeless tobacco: None     Comment: off and on x 8 yrs  . Alcohol Use: No   OB History    No data available     Review of Systems  Constitutional: Negative for fever and  activity change.  Respiratory: Negative for shortness of breath.   Cardiovascular: Negative for chest pain.  Gastrointestinal: Positive for nausea, vomiting and diarrhea. Negative for abdominal pain.  Genitourinary: Negative for dysuria.  Musculoskeletal: Negative for back pain.  Allergic/Immunologic: Negative for immunocompromised state.  Psychiatric/Behavioral: Negative for agitation.      Allergies  Review of patient's allergies indicates no known allergies.  Home Medications   Prior to Admission medications   Medication Sig Start Date End Date Taking? Authorizing Provider  cetirizine (ZYRTEC) 10 MG tablet Take 10 mg by mouth daily.   Yes Historical Provider, MD  estradiol (ESTRACE) 1 MG tablet Take 1 mg by mouth daily.   Yes Historical Provider, MD  Liraglutide 18 MG/3ML SOPN Inject 1.2 mg into the skin daily. 08/19/14  Yes Elayne Snare, MD  Tetrahydrozoline-Zn Sulfate (EYE DROPS ALLERGY RELIEF OP) Apply 2 drops to eye daily.   Yes Historical Provider, MD  BD PEN NEEDLE NANO U/F 32G X 4 MM MISC USE 1 PEN NEEDLE PER DAY WITH VICTOZA 05/18/15   Elayne Snare, MD  glucose blood (ONE TOUCH ULTRA TEST) test strip Use as  instructed to check blood sugar once a day alternating meals dx code 790.29 01/28/14   Elayne Snare, MD  Insulin Pen Needle (BD PEN NEEDLE NANO U/F) 32G X 4 MM MISC Use one pen needle per day with Victoza 05/17/15   Elayne Snare, MD  metroNIDAZOLE (FLAGYL) 500 MG tablet Take 1 tablet (500 mg total) by mouth 2 (two) times daily. Patient not taking: Reported on 12/03/2015 11/22/15   Luvenia Redden, PA-C  [facility] DELICA LANCETS FINE MISC Use to obtain a blood specimen once a day dx code 790.29 01/28/14   Elayne Snare, MD  phenazopyridine (PYRIDIUM) 200 MG tablet Take 1 tablet (200 mg total) by mouth 3 (three) times daily as needed for pain. 11/24/15   Burnis Medin, MD   BP 112/68 mmHg  Pulse 121  Temp(Src) 100.1 F (37.8 C) (Oral)  Resp 18  LMP 12/15/2011 Physical Exam  Constitutional:  She is oriented to person, place, and time. She appears well-developed and well-nourished.  HENT:  Head: Normocephalic and atraumatic.  Eyes: Conjunctivae are normal. Right eye exhibits no discharge.  Neck: Neck supple.  Cardiovascular: Normal rate, regular rhythm and normal heart sounds.   No murmur heard. Pulmonary/Chest: Effort normal and breath sounds normal. She has no wheezes. She has no rales.  Abdominal: Soft. She exhibits no distension. There is no tenderness.  Tenderness to palpation.  Musculoskeletal: Normal range of motion. She exhibits no edema.  Neurological: She is oriented to person, place, and time. No cranial nerve deficit.  Skin: Skin is warm and dry. No rash noted. She is not diaphoretic.  Psychiatric: She has a normal mood and affect. Her behavior is normal.  Nursing note and vitals reviewed.   ED Course  Procedures (including critical care time) Labs Review Labs Reviewed  COMPREHENSIVE METABOLIC PANEL - Abnormal; Notable for the following:    Glucose, Bld 129 (*)    Calcium 8.8 (*)    Total Protein 8.4 (*)    All other components within normal limits  URINALYSIS, ROUTINE W REFLEX MICROSCOPIC (NOT AT [facility]) - Abnormal; Notable for the following:    APPearance CLOUDY (*)    Hgb urine dipstick SMALL (*)    All other components within normal limits  URINE MICROSCOPIC-ADD ON - Abnormal; Notable for the following:    Squamous Epithelial / LPF 0-5 (*)    All other components within normal limits  CBG MONITORING, ED - Abnormal; Notable for the following:    Glucose-Capillary 127 (*)    All other components within normal limits  URINE CULTURE  LIPASE, BLOOD  CBC    Imaging Review No results found. I have personally reviewed and evaluated these images and lab results as part of my medical decision-making.   EKG Interpretation None      MDM   Final diagnoses:  None   patient is a 50 year old female with less than 24 hours of nausea vomiting diarrhea.  Patient recent sick contact in her grandson with similar symptoms. Patient reports no abdominal pain. She reports recent treatment for UTI. She said she finished the whole course of antibiotics couple days ago. We'll recheck her urine today.  Will give fluids, Zofran, by mouth challenge.    10:30 PM Patient feels much improved. She tolerated wonton soup brought by family members.  Trent Gabler Julio Alm, MD 12/03/15 2230

## 2015-12-03 NOTE — ED Notes (Signed)
Bed: WA23 Expected date:  Expected time:  Means of arrival:  Comments: 

## 2015-12-03 NOTE — Discharge Instructions (Signed)
Please return as needed with concerns.Drink plenty of fluids, use zofran as needed to help with nausea.  Viral Gastroenteritis Viral gastroenteritis is also known as stomach flu. This condition affects the stomach and intestinal tract. It can cause sudden diarrhea and vomiting. The illness typically lasts 3 to 8 days. Most people develop an immune response that eventually gets rid of the virus. While this natural response develops, the virus can make you quite ill. CAUSES  Many different viruses can cause gastroenteritis, such as rotavirus or noroviruses. You can catch one of these viruses by consuming contaminated food or water. You may also catch a virus by sharing utensils or other personal items with an infected person or by touching a contaminated surface. SYMPTOMS  The most common symptoms are diarrhea and vomiting. These problems can cause a severe loss of body fluids (dehydration) and a body salt (electrolyte) imbalance. Other symptoms may include:  Fever.  Headache.  Fatigue.  Abdominal pain. DIAGNOSIS  Your caregiver can usually diagnose viral gastroenteritis based on your symptoms and a physical exam. A stool sample may also be taken to test for the presence of viruses or other infections. TREATMENT  This illness typically goes away on its own. Treatments are aimed at rehydration. The most serious cases of viral gastroenteritis involve vomiting so severely that you are not able to keep fluids down. In these cases, fluids must be given through an intravenous line (IV). HOME CARE INSTRUCTIONS   Drink enough fluids to keep your urine clear or pale yellow. Drink small amounts of fluids frequently and increase the amounts as tolerated.  Ask your caregiver for specific rehydration instructions.  Avoid:  Foods high in sugar.  Alcohol.  Carbonated drinks.  Tobacco.  Juice.  Caffeine drinks.  Extremely hot or cold fluids.  Fatty, greasy foods.  Too much intake of  anything at one time.  Dairy products until 24 to 48 hours after diarrhea stops.  You may consume probiotics. Probiotics are active cultures of beneficial bacteria. They may lessen the amount and number of diarrheal stools in adults. Probiotics can be found in yogurt with active cultures and in supplements.  Wash your hands well to avoid spreading the virus.  Only take over-the-counter or prescription medicines for pain, discomfort, or fever as directed by your caregiver. Do not give aspirin to children. Antidiarrheal medicines are not recommended.  Ask your caregiver if you should continue to take your regular prescribed and over-the-counter medicines.  Keep all follow-up appointments as directed by your caregiver. SEEK IMMEDIATE MEDICAL CARE IF:   You are unable to keep fluids down.  You do not urinate at least once every 6 to 8 hours.  You develop shortness of breath.  You notice blood in your stool or vomit. This may look like coffee grounds.  You have abdominal pain that increases or is concentrated in one small area (localized).  You have persistent vomiting or diarrhea.  You have a fever.  The patient is a child younger than 3 months, and he or she has a fever.  The patient is a child older than 3 months, and he or she has a fever and persistent symptoms.  The patient is a child older than 3 months, and he or she has a fever and symptoms suddenly get worse.  The patient is a baby, and he or she has no tears when crying. MAKE SURE YOU:   Understand these instructions.  Will watch your condition.  Will get help right away  if you are not doing well or get worse.   This information is not intended to replace advice given to you by your health care provider. Make sure you discuss any questions you have with your health care provider.   Document Released: 10/02/2005 Document Revised: 12/25/2011 Document Reviewed: 07/19/2011 Elsevier Interactive Patient Education [employer].

## 2015-12-03 NOTE — ED Notes (Signed)
Patient d/c'd self care.  F/U and medications discussed.  Patient verbalized understanding. 

## 2015-12-03 NOTE — ED Notes (Signed)
Per pt, states vomiting for 2 days-states having a headache

## 2015-12-03 NOTE — ED Notes (Signed)
Patient given saltines and diet ginger ale for PO challenge.

## 2015-12-05 LAB — URINE CULTURE

## 2016-02-06 ENCOUNTER — Emergency Department (HOSPITAL_COMMUNITY): Payer: 59

## 2016-02-06 ENCOUNTER — Encounter (HOSPITAL_COMMUNITY): Payer: Self-pay

## 2016-02-06 ENCOUNTER — Encounter (HOSPITAL_COMMUNITY): Payer: Self-pay | Admitting: Anesthesiology

## 2016-02-06 ENCOUNTER — Encounter (HOSPITAL_COMMUNITY)
Admission: EM | Disposition: A | Discharge status: Home / Self Care | Payer: Self-pay | Source: Home / Self Care | Attending: Emergency Medicine

## 2016-02-06 ENCOUNTER — Emergency Department (HOSPITAL_COMMUNITY)
Admission: EM | Admit: 2016-02-06 | Discharge: 2016-02-06 | Disposition: A | Discharge status: Home / Self Care | Payer: 59 | Attending: Emergency Medicine | Admitting: Emergency Medicine

## 2016-02-06 DIAGNOSIS — F419 Anxiety disorder, unspecified: Secondary | ICD-10-CM | POA: Insufficient documentation

## 2016-02-06 DIAGNOSIS — Z87891 Personal history of nicotine dependence: Secondary | ICD-10-CM | POA: Insufficient documentation

## 2016-02-06 DIAGNOSIS — Z79899 Other long term (current) drug therapy: Secondary | ICD-10-CM | POA: Diagnosis not present

## 2016-02-06 DIAGNOSIS — E669 Obesity, unspecified: Secondary | ICD-10-CM | POA: Insufficient documentation

## 2016-02-06 DIAGNOSIS — R040 Epistaxis: Secondary | ICD-10-CM | POA: Insufficient documentation

## 2016-02-06 DIAGNOSIS — Z794 Long term (current) use of insulin: Secondary | ICD-10-CM | POA: Insufficient documentation

## 2016-02-06 DIAGNOSIS — F447 Conversion disorder with mixed symptom presentation: Secondary | ICD-10-CM | POA: Diagnosis not present

## 2016-02-06 DIAGNOSIS — R531 Weakness: Secondary | ICD-10-CM | POA: Diagnosis present

## 2016-02-06 DIAGNOSIS — Z8619 Personal history of other infectious and parasitic diseases: Secondary | ICD-10-CM | POA: Diagnosis not present

## 2016-02-06 DIAGNOSIS — E1142 Type 2 diabetes mellitus with diabetic polyneuropathy: Secondary | ICD-10-CM

## 2016-02-06 DIAGNOSIS — R2990 Unspecified symptoms and signs involving the nervous system: Secondary | ICD-10-CM

## 2016-02-06 DIAGNOSIS — E1140 Type 2 diabetes mellitus with diabetic neuropathy, unspecified: Secondary | ICD-10-CM | POA: Insufficient documentation

## 2016-02-06 DIAGNOSIS — G8192 Hemiplegia, unspecified affecting left dominant side: Secondary | ICD-10-CM | POA: Insufficient documentation

## 2016-02-06 DIAGNOSIS — Z719 Counseling, unspecified: Secondary | ICD-10-CM

## 2016-02-06 DIAGNOSIS — Z8781 Personal history of (healed) traumatic fracture: Secondary | ICD-10-CM | POA: Insufficient documentation

## 2016-02-06 DIAGNOSIS — G8190 Hemiplegia, unspecified affecting unspecified side: Secondary | ICD-10-CM

## 2016-02-06 LAB — CBC
HEMATOCRIT: 38.5 % (ref 36.0–46.0)
Hemoglobin: 12.6 g/dL (ref 12.0–15.0)
MCH: 27.8 pg (ref 26.0–34.0)
MCHC: 32.7 g/dL (ref 30.0–36.0)
MCV: 85 fL (ref 78.0–100.0)
PLATELETS: 389 10*3/uL (ref 150–400)
RBC: 4.53 MIL/uL (ref 3.87–5.11)
RDW: 15.9 % — AB (ref 11.5–15.5)
WBC: 10.5 10*3/uL (ref 4.0–10.5)

## 2016-02-06 LAB — RAPID URINE DRUG SCREEN, HOSP PERFORMED
AMPHETAMINES: NOT DETECTED
Barbiturates: NOT DETECTED
Benzodiazepines: NOT DETECTED
Cocaine: NOT DETECTED
OPIATES: NOT DETECTED
TETRAHYDROCANNABINOL: NOT DETECTED

## 2016-02-06 LAB — COMPREHENSIVE METABOLIC PANEL
ALK PHOS: 62 U/L (ref 38–126)
ALT: 20 U/L (ref 14–54)
ANION GAP: 12 (ref 5–15)
AST: 22 U/L (ref 15–41)
Albumin: 3.3 g/dL — ABNORMAL LOW (ref 3.5–5.0)
BUN: 16 mg/dL (ref 6–20)
CO2: 19 mmol/L — ABNORMAL LOW (ref 22–32)
CREATININE: 0.88 mg/dL (ref 0.44–1.00)
Calcium: 9.5 mg/dL (ref 8.9–10.3)
Chloride: 110 mmol/L (ref 101–111)
GLUCOSE: 137 mg/dL — AB (ref 65–99)
Potassium: 3.7 mmol/L (ref 3.5–5.1)
Sodium: 141 mmol/L (ref 135–145)
TOTAL PROTEIN: 7.9 g/dL (ref 6.5–8.1)
Total Bilirubin: 0.3 mg/dL (ref 0.3–1.2)

## 2016-02-06 LAB — APTT: aPTT: 29 seconds (ref 24–37)

## 2016-02-06 LAB — URINALYSIS, ROUTINE W REFLEX MICROSCOPIC
BILIRUBIN URINE: NEGATIVE
Glucose, UA: NEGATIVE mg/dL
KETONES UR: NEGATIVE mg/dL
LEUKOCYTES UA: NEGATIVE
NITRITE: NEGATIVE
PH: 6 (ref 5.0–8.0)
Protein, ur: NEGATIVE mg/dL
SPECIFIC GRAVITY, URINE: 1.024 (ref 1.005–1.030)

## 2016-02-06 LAB — DIFFERENTIAL
Basophils Absolute: 0 10*3/uL (ref 0.0–0.1)
Basophils Relative: 0 %
EOS PCT: 1 %
Eosinophils Absolute: 0.1 10*3/uL (ref 0.0–0.7)
LYMPHS PCT: 41 %
Lymphs Abs: 4.3 10*3/uL — ABNORMAL HIGH (ref 0.7–4.0)
MONO ABS: 0.5 10*3/uL (ref 0.1–1.0)
MONOS PCT: 5 %
NEUTROS ABS: 5.6 10*3/uL (ref 1.7–7.7)
Neutrophils Relative %: 53 %

## 2016-02-06 LAB — PROTIME-INR
INR: 1.05 (ref 0.00–1.49)
PROTHROMBIN TIME: 13.9 s (ref 11.6–15.2)

## 2016-02-06 LAB — I-STAT CHEM 8, ED
BUN: 18 mg/dL (ref 6–20)
CALCIUM ION: 1.14 mmol/L (ref 1.12–1.23)
CHLORIDE: 108 mmol/L (ref 101–111)
Creatinine, Ser: 0.8 mg/dL (ref 0.44–1.00)
Glucose, Bld: 137 mg/dL — ABNORMAL HIGH (ref 65–99)
HCT: 43 % (ref 36.0–46.0)
Hemoglobin: 14.6 g/dL (ref 12.0–15.0)
Potassium: 3.7 mmol/L (ref 3.5–5.1)
SODIUM: 144 mmol/L (ref 135–145)
TCO2: 20 mmol/L (ref 0–100)

## 2016-02-06 LAB — URINE MICROSCOPIC-ADD ON

## 2016-02-06 LAB — ETHANOL

## 2016-02-06 LAB — I-STAT TROPONIN, ED: Troponin i, poc: 0 ng/mL (ref 0.00–0.08)

## 2016-02-06 LAB — CBG MONITORING, ED: GLUCOSE-CAPILLARY: 107 mg/dL — AB (ref 65–99)

## 2016-02-06 SURGERY — RADIOLOGY WITH ANESTHESIA
Anesthesia: General

## 2016-02-06 MED ORDER — SALINE SPRAY 0.65 % NA SOLN: 1.0000 | NASAL | Status: AC | PRN

## 2016-02-06 MED ORDER — GABAPENTIN 100 MG PO CAPS: 100.0000 mg | ORAL_CAPSULE | Freq: Every day | ORAL | Status: DC

## 2016-02-06 NOTE — Discharge Instructions (Signed)
Precautionary statement for Transient Ischemic Attack A transient ischemic attack (TIA) is a "warning stroke" that causes stroke-like symptoms. Unlike a stroke, a TIA does not cause permanent damage to the brain. The symptoms of a TIA can happen very fast and do not last long. It is important to know the symptoms of a TIA and what to do. This can help prevent a major stroke or death. CAUSES  A TIA is caused by a temporary blockage in an artery in the brain or neck (carotid artery). The blockage does not allow the brain to get the blood supply it needs and can cause different symptoms. The blockage can be caused by either:  A blood clot.  Fatty buildup (plaque) in a neck or brain artery. RISK FACTORS  High blood pressure (hypertension).  High cholesterol.  Diabetes mellitus.  Heart disease.  The buildup of plaque in the blood vessels (peripheral artery disease or atherosclerosis).  The buildup of plaque in the blood vessels that provide blood and oxygen to the brain (carotid artery stenosis).  An abnormal heart rhythm (atrial fibrillation).  Obesity.  Using any tobacco products, including cigarettes, chewing tobacco, or electronic cigarettes.  Taking oral contraceptives, especially in combination with using tobacco.  Physical inactivity.  A diet high in fats, salt (sodium), and calories.  Excessive alcohol use.  Use of illegal drugs (especially cocaine and methamphetamine).  Being female.  Being African American.  Being over the age of 42 years.  Family history of stroke.  Previous history of blood clots, stroke, TIA, or heart attack.  Sickle cell disease. SIGNS AND SYMPTOMS  TIA symptoms are the same as a stroke but are temporary. These symptoms usually develop suddenly, or may be newly present upon waking from sleep:  Sudden weakness or numbness of the face, arm, or leg, especially on one side of the body.  Sudden trouble walking or difficulty moving arms or  legs.  Sudden confusion.  Sudden personality changes.  Trouble speaking (aphasia) or understanding.  Difficulty swallowing.  Sudden trouble seeing in one or both eyes.  Double vision.  Dizziness.  Loss of balance or coordination.  Sudden severe headache with no known cause.  Trouble reading or writing.  Loss of bowel or bladder control.  Loss of consciousness. DIAGNOSIS  Your health care provider may be able to determine the presence or absence of a TIA based on your symptoms, history, and physical exam. CT scan of the brain is usually performed to help identify a TIA. Other tests may include:  Electrocardiography (ECG).  Continuous heart monitoring.  Echocardiography.  Carotid ultrasonography.  MRI.  A scan of the brain circulation.  Blood tests. TREATMENT  Since the symptoms of TIA are the same as a stroke, it is important to seek treatment as soon as possible. You may need a medicine to dissolve a blood clot (thrombolytic) if that is the cause of the TIA. This medicine cannot be given if too much time has passed. Treatment may also include:   Rest, oxygen, fluids through an IV tube, and medicines to thin the blood (anticoagulants).  Measures will be taken to prevent short-term and long-term complications, including infection from breathing foreign material into the lungs (aspiration pneumonia), blood clots in the legs, and falls.  Procedures to either remove plaque in the carotid arteries or dilate carotid arteries that have narrowed due to plaque. Those procedures are:  Carotid endarterectomy.  Carotid angioplasty and stenting.  Medicines and diet may be used to address diabetes, high  blood pressure, and other underlying risk factors. HOME CARE INSTRUCTIONS   Take medicines only as directed by your health care provider. Follow the directions carefully. Medicines may be used to control risk factors for a stroke. Be sure you understand all your medicine  instructions.  You may be told to take aspirin or the anticoagulant warfarin. Warfarin needs to be taken exactly as instructed.  Taking too much or too little warfarin is dangerous. Too much warfarin increases the risk of bleeding. Too little warfarin continues to allow the risk for blood clots. While taking warfarin, you will need to have regular blood tests to measure your blood clotting time. A PT blood test measures how long it takes for blood to clot. Your PT is used to calculate another value called an INR. Your PT and INR help your health care provider to adjust your dose of warfarin. The dose can change for many reasons. It is critically important that you take warfarin exactly as prescribed.  Many foods, especially foods high in vitamin K can interfere with warfarin and affect the PT and INR. Foods high in vitamin K include spinach, kale, broccoli, cabbage, collard and turnip greens, Brussels sprouts, peas, cauliflower, seaweed, and parsley, as well as beef and pork liver, green tea, and soybean oil. You should eat a consistent amount of foods high in vitamin K. Avoid major changes in your diet, or notify your health care provider before changing your diet. Arrange a visit with a dietitian to answer your questions.  Many medicines can interfere with warfarin and affect the PT and INR. You must tell your health care provider about any and all medicines you take; this includes all vitamins and supplements. Be especially cautious with aspirin and anti-inflammatory medicines. Do not take or discontinue any prescribed or over-the-counter medicine except on the advice of your health care provider or pharmacist.  Warfarin can have side effects, such as excessive bruising or bleeding. You will need to hold pressure over cuts for longer than usual. Your health care provider or pharmacist will discuss other potential side effects.  Avoid sports or activities that may cause injury or bleeding.  Be  careful when shaving, flossing your teeth, or handling sharp objects.  Alcohol can change the body's ability to handle warfarin. It is best to avoid alcoholic drinks or consume only very small amounts while taking warfarin. Notify your health care provider if you change your alcohol intake.  Notify your dentist or other health care providers before procedures.  Eat a diet that includes 5 or more servings of fruits and vegetables each day. This may reduce the risk of stroke. Certain diets may be prescribed to address high blood pressure, high cholesterol, diabetes, or obesity.  A diet low in sodium, saturated fat, trans fat, and cholesterol is recommended to manage high blood pressure.  A diet low in saturated fat, trans fat, and cholesterol, and high in fiber may control cholesterol levels.  A controlled-carbohydrate, controlled-sugar diet is recommended to manage diabetes.  A reduced-calorie diet that is low in sodium, saturated fat, trans fat, and cholesterol is recommended to manage obesity.  Maintain a healthy weight.  Stay physically active. It is recommended that you get at least 30 minutes of activity on most or all days.  Do not use any tobacco products, including cigarettes, chewing tobacco, or electronic cigarettes. If you need help quitting, ask your health care provider.  Limit alcohol intake to no more than 1 drink per day for nonpregnant women  and 2 drinks per day for men. One drink equals 12 ounces of beer, 5 ounces of wine, or 1 ounces of hard liquor.  Do not abuse drugs.  A safe home environment is important to reduce the risk of falls. Your health care provider may arrange for specialists to evaluate your home. Having grab bars in the bedroom and bathroom is often important. Your health care provider may arrange for equipment to be used at home, such as raised toilets and a seat for the shower.  Follow all instructions for follow-up with your health care provider. This  is very important. This includes any referrals and lab tests. Proper follow-up can prevent a stroke or another TIA from occurring. PREVENTION  The risk of a TIA can be decreased by appropriately treating high blood pressure, high cholesterol, diabetes, heart disease, and obesity, and by quitting smoking, limiting alcohol, and staying physically active. SEEK MEDICAL CARE IF:  You have personality changes.  You have difficulty swallowing.  You are seeing double.  You have dizziness.  You have a fever. SEEK IMMEDIATE MEDICAL CARE IF:  Any of the following symptoms may represent a serious problem that is an emergency. Do not wait to see if the symptoms will go away. Get medical help right away. Call your local emergency services (911 in [place] Do not drive yourself to the hospital.  You have sudden weakness or numbness of the face, arm, or leg, especially on one side of the body.  You have sudden trouble walking or difficulty moving arms or legs.  You have sudden confusion.  You have trouble speaking (aphasia) or understanding.  You have sudden trouble seeing in one or both eyes.  You have a loss of balance or coordination.  You have a sudden, severe headache with no known cause.  You have new chest pain or an irregular heartbeat.  You have a partial or total loss of consciousness. MAKE SURE YOU:   Understand these instructions.  Will watch your condition.  Will get help right away if you are not doing well or get worse.   This information is not intended to replace advice given to you by your health care provider. Make sure you discuss any questions you have with your health care provider.   Document Released: 07/12/2005 Document Revised: 10/23/2014 Document Reviewed: 01/07/2014 Elsevier Interactive Patient Education 2016 [employer]. Paresthesia/weakness Paresthesia is an abnormal burning or prickling sensation. This sensation is generally felt in the hands, arms, legs,  or feet. However, it may occur in any part of the body. Usually, it is not painful. The feeling may be described as:  Tingling or numbness.  Pins and needles.  Skin crawling.  Buzzing.  Limbs falling asleep.  Itching. Most people experience temporary (transient) paresthesia at some time in their lives. Paresthesia may occur when you breathe too quickly (hyperventilation). It can also occur without any apparent cause. Commonly, paresthesia occurs when pressure is placed on a nerve. The sensation quickly goes away after the pressure is removed. For some people, however, paresthesia is a long-lasting (chronic) condition that is caused by an underlying disorder. If you continue to have paresthesia, you may need further medical evaluation. HOME CARE INSTRUCTIONS Watch your condition for any changes. Taking the following actions may help to lessen any discomfort that you are feeling:  Avoid drinking alcohol.  Try acupuncture or massage to help relieve your symptoms.  Keep all follow-up visits as directed by your health care provider. This is important. SEEK  MEDICAL CARE IF:  You continue to have episodes of paresthesia.  Your burning or prickling feeling gets worse when you walk.  You have pain, cramps, or dizziness.  You develop a rash. SEEK IMMEDIATE MEDICAL CARE IF:  You feel weak.  You have trouble walking or moving.  You have problems with speech, understanding, or vision.  You feel confused.  You cannot control your bladder or bowel movements.  You have numbness after an injury.  You faint.   This information is not intended to replace advice given to you by your health care provider. Make sure you discuss any questions you have with your health care provider.   Document Released: 09/22/2002 Document Revised: 02/16/2015 Document Reviewed: 09/28/2014 Elsevier Interactive Patient Education 2016 [employer] Nosebleed Nosebleeds are common. They are due to a crack  in the inside lining of your nose (mucous membrane) or from a small blood vessel that starts to bleed. Nosebleeds can be caused by many conditions, such as injury, infections, dry mucous membranes or dry climate, medicines, nose picking, and home heating and cooling systems. Most nosebleeds come from blood vessels in the front of your nose. HOME CARE INSTRUCTIONS   Try controlling your nosebleed by pinching your nostrils gently and continuously for at least 10 minutes.  Avoid blowing or sniffing your nose for a number of hours after having a nosebleed.  Do not put gauze inside your nose yourself. If your nose was packed by your health care provider, try to maintain the pack inside of your nose until your health care provider removes it.  If a gauze pack was used and it starts to fall out, gently replace it or cut off the end of it.  If a balloon catheter was used to pack your nose, do not cut or remove it unless your health care provider has instructed you to do that.  Avoid lying down while you are having a nosebleed. Sit up and lean forward.  Use a nasal spray decongestant to help with a nosebleed as directed by your health care provider.  Do not use petroleum jelly or mineral oil in your nose. These can drip into your lungs.  Maintain humidity in your home by using less air conditioning or by using a humidifier.  Aspirinand blood thinners make bleeding more likely. If you are prescribed these medicines and you suffer from nosebleeds, ask your health care provider if you should stop taking the medicines or adjust the dose. Do not stop medicines unless directed by your health care provider  Resume your normal activities as you are able, but avoid straining, lifting, or bending at the waist for several days.  If your nosebleed was caused by dry mucous membranes, use over-the-counter saline nasal spray or gel. This will keep the mucous membranes moist and allow them to heal. If you must use  a lubricant, choose the water-soluble variety. Use it only sparingly, and do not use it within several hours of lying down.  Keep all follow-up visits as directed by your health care provider. This is important. SEEK MEDICAL CARE IF:  You have a fever.  You get frequent nosebleeds.  You are getting nosebleeds more often. SEEK IMMEDIATE MEDICAL CARE IF:  Your nosebleed lasts longer than 20 minutes.  Your nosebleed occurs after an injury to your face, and your nose looks crooked or broken.  You have unusual bleeding from other parts of your body.  You have unusual bruising on other parts of your body.  You feel light-headed or you faint.  You become sweaty.  You vomit blood.  Your nosebleed occurs after a head injury.   This information is not intended to replace advice given to you by your health care provider. Make sure you discuss any questions you have with your health care provider.   Document Released: 07/12/2005 Document Revised: 10/23/2014 Document Reviewed: 05/18/2014 Elsevier Interactive Patient Education [employer]

## 2016-02-06 NOTE — ED Notes (Signed)
MD Pfeiffer at the bedside 

## 2016-02-06 NOTE — Consult Note (Signed)
Requesting Physician: Dr.  Vallery Ridge    Reason for consultation:  Stroke code   HPI:                                                                                                                                         Monique Nelson is an 50 y.o. female patient who  [place] after she was noted to have left-sided weakness and altered mental status. The patient evidently fell from a chair, no significant trauma. She is unable to provide history initially when she presented to the ER.  Review of the EMR show that she has diabetes, history of alcohol and substance abuse several years ago, obstructive sleep apnea. She was noted to be crying when she initially presented when she was in the CT scanner room.   Date last known well:  2017, April 23rd Time last known well:  At  1200 tPA Given: No: Conversion disorder  Stroke Risk Factors - diabetes mellitus  Past Medical History: Past Medical History  Diagnosis Date  . Vertigo     intermittent infrequent  . Drug addiction ([place])     recovering  . Hx of varicella   . Substance abuse in remission 13 years ago     cocaine heroiin  met   support via [employer]  . Ex-smoker 2009  . History of fracture of wrist 17 years ago    has plate and pins right   . Hx of hysterectomy     bleeding and cysts  . Diabetes mellitus without complication [facility])     Past Surgical History  Procedure Laterality Date  . Rt hand surgery      17 years ago  . Ablastion    . Abdominal hysterectomy      Family History: Family History  Problem Relation Age of Onset  . COPD Mother   . Diabetes Mother   . Hypertension Mother   . Sleep apnea Mother     and sister   . Thyroid disease Mother   . Chronic fatigue Mother   . Glaucoma Father   . Other Father     prostate disease  . Osteoarthritis Mother     knee replacement   . Alcohol abuse      drug parent    Social History:   reports that she has quit smoking. Her  smoking use included Cigarettes. She started smoking about 2 years ago. She has a .32 pack-year smoking history. She does not have any smokeless tobacco history on file. She reports that she uses illicit drugs ("Crack" cocaine, Heroin, Methamphetamines, and Marijuana). She reports that she does not drink alcohol.  Allergies:  No Known Allergies   Medications:  No current facility-administered medications for this encounter.  Current outpatient prescriptions:  .  BD PEN NEEDLE NANO U/F 32G X 4 MM MISC, USE 1 PEN NEEDLE PER DAY WITH VICTOZA, Disp: 50 each, Rfl: 3 .  cetirizine (ZYRTEC) 10 MG tablet, Take 10 mg by mouth daily., Disp: , Rfl:  .  estradiol (ESTRACE) 1 MG tablet, Take 1 mg by mouth daily., Disp: , Rfl:  .  glucose blood (ONE TOUCH ULTRA TEST) test strip, Use as instructed to check blood sugar once a day alternating meals dx code 790.29, Disp: 50 each, Rfl: 3 .  Insulin Pen Needle (BD PEN NEEDLE NANO U/F) 32G X 4 MM MISC, Use one pen needle per day with Victoza, Disp: 30 each, Rfl: 3 .  Liraglutide 18 MG/3ML SOPN, Inject 1.2 mg into the skin daily., Disp: 2 pen, Rfl: 0 .  metroNIDAZOLE (FLAGYL) 500 MG tablet, Take 1 tablet (500 mg total) by mouth 2 (two) times daily. (Patient not taking: Reported on 12/03/2015), Disp: 14 tablet, Rfl: 0 .  ondansetron (ZOFRAN) 4 MG tablet, Take 1 tablet (4 mg total) by mouth every 8 (eight) hours as needed for nausea or vomiting., Disp: 11 tablet, Rfl: 0 .  ONETOUCH DELICA LANCETS FINE MISC, Use to obtain a blood specimen once a day dx code 790.29, Disp: 100 each, Rfl: 1 .  phenazopyridine (PYRIDIUM) 200 MG tablet, Take 1 tablet (200 mg total) by mouth 3 (three) times daily as needed for pain., Disp: 15 tablet, Rfl: 0 .  Tetrahydrozoline-Zn Sulfate (EYE DROPS ALLERGY RELIEF OP), Apply 2 drops to eye daily., Disp: , Rfl:    ROS:                                                                                                                                        History obtained from the patient  General ROS: negative for - chills, fatigue, fever, night sweats, weight gain or weight loss Psychological ROS: negative for - behavioral disorder, hallucinations, memory difficulties, mood swings or suicidal ideation Ophthalmic ROS: negative for - blurry vision, double vision, eye pain or loss of vision ENT ROS: negative for - epistaxis, nasal discharge, oral lesions, sore throat, tinnitus or vertigo Allergy and Immunology ROS: negative for - hives or itchy/watery eyes Hematological and Lymphatic ROS: negative for - bleeding problems, bruising or swollen lymph nodes Endocrine ROS: negative for - galactorrhea, hair pattern changes, polydipsia/polyuria or temperature intolerance Respiratory ROS: negative for - cough, hemoptysis, shortness of breath or wheezing Cardiovascular ROS: negative for - chest pain, dyspnea on exertion, edema or irregular heartbeat Gastrointestinal ROS: negative for - abdominal pain, diarrhea, hematemesis, nausea/vomiting or stool incontinence Genito-Urinary ROS: negative for - dysuria, hematuria, incontinence or urinary frequency/urgency Musculoskeletal ROS: negative for - joint swelling or muscular weakness Neurological ROS: as noted in HPI Dermatological ROS: negative for rash and skin lesion changes  Neurologic Examination:  Today's Vitals   02/06/16 1300  Weight: 120.5 kg (265 lb 10.5 oz)    Evaluation of higher integrative functions including: Level of alertness: Alert,  Oriented to time, place and person Speech: fluent, no evidence of dysarthria or aphasia noted.  Test the following cranial nerves: 2-12 grossly intact Motor examination: Normal tone, bulk, full 5/5 motor strength in Right upper and lower  extremities, inconsistent poor effort in the left upper and lower extremities initially. Reevaluation 2:20 PM following the MRI studies showed normal full-strength in all 4 extremities  Examination of sensation : Patient initially reported reduced sensation in the left face upper and lower extremities , but reevaluation following the MRI study reported symmetric sensation to pinprick in all 4 extremities and on face, except for some mild length-dependent sensory loss in her feet from diabetic polyneuropathy.  Examination of deep tendon reflexes: 2+, normal and symmetric in all extremities, normal plantars bilaterally Test coordination: was not cooperative at initial presentation, but reevaluation showed Normal finger nose testing, with no evidence of limb appendicular ataxia or abnormal involuntary movements or tremors noted.  Gait: Mildly Wide based due to body habitus , grossly within normal limits. No significant gait ataxia noted   Lab Results: Basic Metabolic Panel:  Recent Labs Lab 02/06/16 1302  NA 144  K 3.7  CL 108  GLUCOSE 137*  BUN 18  CREATININE 0.80    Liver Function Tests: No results for input(s): AST, ALT, ALKPHOS, BILITOT, PROT, ALBUMIN in the last 168 hours. No results for input(s): LIPASE, AMYLASE in the last 168 hours. No results for input(s): AMMONIA in the last 168 hours.  CBC:  Recent Labs Lab 02/06/16 1302  HGB 14.6  HCT 43.0    Cardiac Enzymes: No results for input(s): CKTOTAL, CKMB, CKMBINDEX, TROPONINI in the last 168 hours.  Lipid Panel: No results for input(s): CHOL, TRIG, HDL, CHOLHDL, VLDL, LDLCALC in the last 168 hours.  CBG: No results for input(s): GLUCAP in the last 168 hours.  Microbiology: Results for orders placed or performed during the hospital encounter of 12/03/15  Urine culture     Status: None   Collection Time: 12/03/15  8:59 PM  Result Value Ref Range Status   Specimen Description URINE, RANDOM  Final   Special Requests  NONE  Final   Culture   Final    MULTIPLE SPECIES PRESENT, SUGGEST RECOLLECTION Performed at [facility]    Report Status 12/05/2015 FINAL  Final     Imaging: CT of the head negative for acute pathology. MRI of the brain negative for acute stroke.    Assessment and plan:   Monique Nelson is an 50 y.o. female patient who presented with with subjective symptoms of left-sided weakness and numbness, patient is noted to be crying and had inconsistent neurologic examination suspicious for conversion disorder at initial presentation. CT of the head was negative. Because of the inconsistencies, a stat MRI was obtained to definitively rule out any intracranial pathology causing these left-sided symptoms. MRI of the brain was negative for acute stroke. No significant intracranial pathology is noted to account for the symptoms.  Reevaluation following the MRI showed the patient had normal strength, normal sensation, gait was normal and she is a symptomatic at baseline. This likely suggest conversion disorder which has resolved.  Patient is diabetic and reported having some burning paresthesias in her feet, worse on the left side. Recommend starting gabapentin 100 mg in the evening after dinner for symptomatic relief.  Follow-up  with outpatient neurology.   She also reported having some nosebleeds yesterday. Will defer to Dr. Vallery Ridge for further evaluation.  From neurological standpoint, no further neurodiagnostic testing recommended at this time. If medically stable, she can be discharged home and follow up with outpatient neurologist and primary care physician. Discussed this with patient and her daughter at bedside, answered several of their questions to their satisfaction. Patient understands that her symptoms were likely secondary to stress. Discussed with Dr. Vallery Ridge.

## 2016-02-06 NOTE — ED Provider Notes (Signed)
CSN: [id number]     Arrival date & time 02/06/16  1251 History   First MD Initiated Contact with Patient 02/06/16 1251     Chief Complaint  Patient presents with  . Code Stroke     (Consider location/radiation/quality/duration/timing/severity/associated sxs/prior Treatment) HPI On arrival:Symptoms onset at noon. Weakness of left upper extremity and gait incoordination. No history of stroke. Patient directly to CT.  Subsequent history: She was at church when she had a sudden syncopal episode. Upon awakening, neurologic examination showed left sided weakness and gait difficulty. Patient denies ever having had similar episode. She does report she has been under a large amount of stress. She does report she had burning in her feet for several months. She reports that she has been diagnosed with diabetes and some peripheral neuropathy. She reports a burning does occur in both feet periodically though it seems to concentrate more into the right.  On review of systems, patient does report several episodes of nosebleed. She states one of them occurred while she was sleeping and it her face was wet. The bleeding stopped without difficulty. She is not on any blood thinners. Past Medical History  Diagnosis Date  . Vertigo     intermittent infrequent  . Drug addiction (HCC)     recovering  . Hx of varicella   . Substance abuse in remission 13 years ago     cocaine heroiin  met   support via ADP  . Ex-smoker 2009  . History of fracture of wrist 17 years ago    has plate and pins right   . Hx of hysterectomy     bleeding and cysts  . Diabetes mellitus without complication [facility])    Past Surgical History  Procedure Laterality Date  . Rt hand surgery      17 years ago  . Ablastion    . Abdominal hysterectomy     Family History  Problem Relation Age of Onset  . COPD Mother   . Diabetes Mother   . Hypertension Mother   . Sleep apnea Mother     and sister   . Thyroid disease Mother   .  Chronic fatigue Mother   . Glaucoma Father   . Other Father     prostate disease  . Osteoarthritis Mother     knee replacement   . Alcohol abuse      drug parent   Social History  Substance Use Topics  . Smoking status: Former Smoker -- 0.04 packs/day for 8 years    Types: Cigarettes    Start date: 10/21/2013  . Smokeless tobacco: None     Comment: off and on x 8 yrs  . Alcohol Use: No   OB History    No data available     Review of Systems 10 Systems reviewed and are negative for acute change except as noted in the HPI.    Allergies  Review of patient's allergies indicates no known allergies.  Home Medications   Prior to Admission medications   Medication Sig Start Date End Date Taking? Authorizing Provider  acetaminophen (TYLENOL) 325 MG tablet Take 650 mg by mouth every 6 (six) hours as needed for moderate pain.   Yes Historical Provider, MD  BD PEN NEEDLE NANO U/F 32G X 4 MM MISC USE 1 PEN NEEDLE PER DAY WITH VICTOZA 05/18/15  Yes Reather Littler, MD  cetirizine (ZYRTEC) 10 MG tablet Take 10 mg by mouth daily.   Yes Historical Provider, MD  glucose  blood (ONE TOUCH ULTRA TEST) test strip Use as instructed to check blood sugar once a day alternating meals dx code 790.29 01/28/14  Yes Elayne Snare, MD  Insulin Pen Needle (BD PEN NEEDLE NANO U/F) 32G X 4 MM MISC Use one pen needle per day with Victoza 05/17/15  Yes Elayne Snare, MD  Liraglutide 18 MG/3ML SOPN Inject 1.2 mg into the skin daily. 08/19/14  Yes Elayne Snare, MD  [facility] DELICA LANCETS FINE MISC Use to obtain a blood specimen once a day dx code 790.29 01/28/14  Yes Elayne Snare, MD  Tetrahydrozoline-Zn Sulfate (EYE DROPS ALLERGY RELIEF OP) Apply 2 drops to eye daily.   Yes Historical Provider, MD  gabapentin (NEURONTIN) 100 MG capsule Take 1 capsule (100 mg total) by mouth at bedtime. 02/06/16   Charlesetta Shanks, MD  sodium chloride (OCEAN) 0.65 % SOLN nasal spray Place 1 spray into both nostrils as needed for congestion. 02/06/16    Charlesetta Shanks, MD   BP 127/50 mmHg  Pulse 78  Temp(Src) 98.4 F (36.9 C) (Oral)  Resp 18  Ht '5\' 2"'$  (1.575 m)  Wt 265 lb (120.203 kg)  BMI 48.46 kg/m2  SpO2 96%  LMP 12/15/2011 Physical Exam  Constitutional:  Artery obese. Patient is alert. No respiratory distress. Tearful.  HENT:  Head: Normocephalic and atraumatic.  Right Ear: External ear normal.  Left Ear: External ear normal.  Nose: Nose normal.  Mouth/Throat: Oropharynx is clear and moist.  Bilateral nares are patent. Inferior turbinates are visible without any erosions or polyps. Inferior turbinates obscured most of the nasal passage for further visualization. The septum does not show abnormality. Posterior oropharynx is widely patent without any blood streaking. Dentition is in good condition. Bilateral TMs normal.  Eyes: EOM are normal. Pupils are equal, round, and reactive to light.  Neck: Neck supple.  Cardiovascular: Normal rate, regular rhythm, normal heart sounds and intact distal pulses.   Pulmonary/Chest: Effort normal and breath sounds normal.  Abdominal: Soft. She exhibits no distension. There is no tenderness.  Musculoskeletal: She exhibits no edema or tenderness.  Neurological: She is alert. Coordination abnormal.  Initial examination on arrival :Decreased ability to elevate left upper extremity. Left extremity grip strength 3 out of 5.  Examination of her diagnostic workup shows cranial nerves II through XII intact. Patient has normal speech and cognitive function. Bilateral upper extremities 5 out of 5 grip strength. Lower lower extremities normal plantar flexion and extension.  Skin: Skin is warm and dry.  Psychiatric:  On arrival: Patient is tearful and anxious On recheck: Patient is calm and pleasantly interactive with family members.    ED Course  Procedures (including critical care time) Labs Review Labs Reviewed  CBC - Abnormal; Notable for the following:    RDW 15.9 (*)    All other components  within normal limits  DIFFERENTIAL - Abnormal; Notable for the following:    Lymphs Abs 4.3 (*)    All other components within normal limits  COMPREHENSIVE METABOLIC PANEL - Abnormal; Notable for the following:    CO2 19 (*)    Glucose, Bld 137 (*)    Albumin 3.3 (*)    All other components within normal limits  URINALYSIS, ROUTINE W REFLEX MICROSCOPIC (NOT AT [facility]) - Abnormal; Notable for the following:    Hgb urine dipstick TRACE (*)    All other components within normal limits  URINE MICROSCOPIC-ADD ON - Abnormal; Notable for the following:    Squamous Epithelial / LPF 0-5 (*)  Bacteria, UA RARE (*)    All other components within normal limits  I-STAT CHEM 8, ED - Abnormal; Notable for the following:    Glucose, Bld 137 (*)    All other components within normal limits  CBG MONITORING, ED - Abnormal; Notable for the following:    Glucose-Capillary 107 (*)    All other components within normal limits  ETHANOL  PROTIME-INR  APTT  URINE RAPID DRUG SCREEN, HOSP PERFORMED  I-STAT TROPOININ, ED    Imaging Review Ct Head Wo Contrast  02/06/2016  CLINICAL DATA:  Code stroke.  Syncopal episode with fall. EXAM: CT HEAD WITHOUT CONTRAST TECHNIQUE: Contiguous axial images were obtained from the base of the skull through the vertex without intravenous contrast. COMPARISON:  12/05/2007 FINDINGS: No evidence of intracranial hemorrhage, brain edema, or other signs of acute infarction. No evidence of intracranial mass lesion or mass effect. No abnormal extraaxial fluid collections identified. Ventricles are normal in size. No skull abnormality identified. IMPRESSION: Negative noncontrast head CT. These results were called by telephone at the time of interpretation on 02/06/2016 at 1:02 pm to Dr. Silverio Decamp, who verbally acknowledged these results. Electronically Signed   By: Earle Gell M.D.   On: 02/06/2016 13:03   Mr Brain Wo Contrast  02/06/2016  CLINICAL DATA:  51 year old female code stroke.  Syncope and fall. Subsequent left side weakness and ataxia. Initial encounter. EXAM: MRI HEAD WITHOUT CONTRAST TECHNIQUE: Multiplanar, multiecho pulse sequences of the brain and surrounding structures were obtained without intravenous contrast. COMPARISON:  Noncontrast head CT 1252 hours today. FINDINGS: No restricted diffusion to suggest acute infarction. No midline shift, mass effect, evidence of mass lesion, ventriculomegaly, extra-axial collection or acute intracranial hemorrhage. Cervicomedullary junction and pituitary are within normal limits. Major intracranial vascular flow voids are preserved. Cerebral volume is normal. Intermittent mild motion artifact. Pearline Cables and white matter signal is within normal limits throughout the brain. No encephalomalacia or chronic cerebral blood products identified. Grossly normal visualized internal auditory structures. Mastoids are clear. Trace paranasal sinus fluid or mucosal thickening. Negative orbit and scalp soft tissues. Visualized bone marrow signal is within normal limits. Grossly negative visualized cervical spine. IMPRESSION: No acute intracranial abnormality. Negative noncontrast MRI appearance of the brain. Electronically Signed   By: Genevie Ann M.D.   On: 02/06/2016 13:43   I have personally reviewed and evaluated these images and lab results as part of my medical decision-making.   EKG Interpretation   Date/Time:  Sunday February 06 2016 13:46:33 EDT Ventricular Rate:  85 PR Interval:  152 QRS Duration: 88 QT Interval:  396 QTC Calculation: 471 R Axis:   16 Text Interpretation:  Sinus rhythm normal Confirmed by Johnney Killian, MD, Jeannie Done  [phone]) on 02/06/2016 1:51:05 PM     Consult: Dr. Silverio Decamp was consult at for code stroke presentation. As assessed the patient and finds no abnormality on MRI and neurologic examination completely normalized. He suspects conversion disorder due to stressful event. I will be for discharge with outpatient follow-up with  neurology. MDM   Final diagnoses:  Hemiparesis ([place])  Diabetic peripheral neuropathy ([place])   A she presented as outlined above. She has had diagnostic evaluation neurology consultation. Symptoms have normalized. Dr. Silverio Decamp has requested prescription of gabapentin for peripheral neuropathy. I have assessed the patient for minor nosebleed. No acute abnormalities identified. She is counseled to use Ocean Spray and continue her Claritin which she is taking for sinus symptoms. If symptoms do not abate she is to follow-up with ENT. A  she is discharged in good condition. She is alert and interactive. Patient is calm and appropriate. She has no neurologic deficit.    Charlesetta Shanks, MD 02/06/16 [phone]

## 2016-02-06 NOTE — ED Notes (Addendum)
Per [facility], Pt is coming from church when she had a syncopal episode at 1200. Pt had witnessed collapse from bystanders where she woke up and reported left side weakness and ataxia. Pt has drift noted to left arm. Pt is alert and oriented x4. Pt reports having headache starting yesterday and nosebleed yesterday. Pt is alert and oriented x4 upon arrival to the ED. Vitals per [facility]: 129 CBG 177/148, 95 NSR, 20 Gauge Bilateral FA

## 2016-02-06 NOTE — ED Notes (Signed)
Cancelled Code Stroke per, Dr. Lavon PaganiniNandigam

## 2016-02-06 NOTE — ED Notes (Signed)
Arrived to MRI.

## 2016-02-06 NOTE — ED Notes (Signed)
Pt arrived to the room.

## 2016-02-06 NOTE — Anesthesia Preprocedure Evaluation (Deleted)
Anesthesia Evaluation  Patient identified by MRN, date of birth, ID band Patient awake    Reviewed: Allergy & Precautions, H&P , NPO status , Patient's Chart, lab work & pertinent test results  Airway        Dental no notable dental hx.    Pulmonary neg pulmonary ROS, former smoker,    Pulmonary exam normal        Cardiovascular negative cardio ROS       Neuro/Psych negative psych ROS   GI/Hepatic negative GI ROS, Neg liver ROS,   Endo/Other    Renal/GU negative Renal ROS  negative genitourinary   Musculoskeletal   Abdominal   Peds  Hematology negative hematology ROS (+)   Anesthesia Other Findings   Reproductive/Obstetrics negative OB ROS                            Anesthesia Physical Anesthesia Plan  ASA:   Anesthesia Plan:    Post-op Pain Management:    Induction:   Airway Management Planned:   Additional Equipment:   Intra-op Plan:   Post-operative Plan:   Informed Consent:   Plan Discussed with: CRNA  Anesthesia Plan Comments:        Anesthesia Quick Evaluation

## 2016-02-07 ENCOUNTER — Telehealth: Payer: Self-pay | Admitting: *Deleted

## 2016-02-07 ENCOUNTER — Telehealth: Payer: Self-pay | Admitting: Internal Medicine

## 2016-02-07 NOTE — Telephone Encounter (Signed)
wecan discuss this when she comes into appt

## 2016-02-07 NOTE — Telephone Encounter (Signed)
Spoke to the pt.  She will come in tomorrow at 3:30 for a 30 minute appt.  She would like to discuss a neuro referral with [facility].  Would like a recommendation.

## 2016-02-07 NOTE — Congregational Nurse Program (Signed)
Congregational Nurse Program Note  Date of Encounter: 02/06/2016  Past Medical History: Past Medical History  Diagnosis Date  . Vertigo     intermittent infrequent  . Drug addiction ([place])     recovering  . Hx of varicella   . Substance abuse in remission 13 years ago     cocaine heroiin  met   support via [employer]  . Ex-smoker 2009  . History of fracture of wrist 17 years ago    has plate and pins right   . Hx of hysterectomy     bleeding and cysts  . Diabetes mellitus without complication [facility])     Encounter Details:     CNP Questionnaire - 02/07/16 0007    Patient Demographics   Is this a new or existing patient? Existing   Patient is considered a/an Not Applicable   Race African-American/Black   Patient Assistance   Location of Patient Assistance [name]   Patient's financial/insurance status Private Insurance Coverage   Uninsured Patient No   Patient referred to apply for the following financial assistance Not Applicable   Food insecurities addressed Not Applicable   Transportation assistance No   Assistance securing medications No   Educational health offerings Acute disease;Diabetes;Safety   Encounter Details   Primary purpose of visit Acute Illness/Condition Visit;Chronic Illness/Condition Visit   Was an Emergency Department visit averted? No   Does patient have a medical provider? Yes   Patient referred to Emergency Department   Was a mental health screening completed? (GAINS tool) No   Does patient have dental issues? No   Does patient have vision issues? No   Since previous encounter, have you referred patient for abnormal blood pressure that resulted in a new diagnosis or medication change? No   Since previous encounter, have you referred patient for abnormal blood glucose that resulted in a new diagnosis or medication change? No   For Abstraction Use Only   Does patient have insurance? Yes     Client fell during church service. Questionable loss of  consciousness,.  Client was confused and hesitant in speech.  Reported vision loss.  Crying asking for Mom and sister. Complained of severe headache.  Family reported nose bleed yesterday and this morning.  I instructed bystander to call 911.  BP taken by nurse assisting me.  140/80.  Pulse 90.  I spoke to 911 operator, asked client to smile, no weakness noted.  Client's eyes were red.  Unable to lift both arms.  Left arm weakness.  Left hand strength diminished.  Speech hesitant.  EMS arrived and began evaluation.  Client transported to hospital via ambulance.  Paramedic felt that quick response would offer a better outcome.  Family members followed to hospital.

## 2016-02-07 NOTE — Telephone Encounter (Signed)
FYI - Wanted to make aware of triage call. Patient has appointment tomorrow (02/08/16) with Dr. Fabian SharpPanosh at 3:30pm.  -------------------------------------------------------------------------------------------------------------------------------------------------------------------------- PLEASE NOTE: All timestamps contained within this report are represented as [place] Standard Time. CONFIDENTIALTY NOTICE: This fax transmission is intended only for the addressee. It contains information that is legally privileged, confidential or otherwise protected from use or disclosure. If you are not the intended recipient, you are strictly prohibited from reviewing, disclosing, copying using or disseminating any of this information or taking any action in reliance on or regarding this information. If you have received this fax in error, please notify us immediately by telephone so that we can arrange for its return to us. Phone: [phone], Toll-Free: [phone], Fax: [phone] Page: 1 of 1 Call Id: [id number] [facility] - Client TELEPHONE ADVICE RECORD [facility] Patient Name: Monique Nelson Gender: Female DOB: [birth year] Age: [age] Y 2 M 3 D Return Phone Number: [phone] (Primary) Address: [address] [place]State[place]Zip: [place] [address] Client [facility] Night - Client Client Site [facility] - Night Physician Berniece AndreasPanosh, Wanda Contact Type Call Who Is Calling Patient / Member / Family / Caregiver Call Type Triage / Clinical Caller Name Daniel NonesLetitia Relationship To Patient Self Return Phone Number [phone] (Primary) Chief Complaint Nosebleed Reason for Call Symptomatic / Request for Health Information Initial Comment Caller states she has been sick x24 hours, taken to ER via 911 for suspected stroke. Did diagnose her with TIA. Caller needs appt ASAP. She has nosebleed/running down her throat, comes  and goes. Translation No Nurse Assessment Nurse: Roma KayserForsythe, RN, Santina Evansatherine Date/Time (Eastern Time): 02/07/2016 12:00:56 AM Confirm and document reason for call. If symptomatic, describe symptoms. You must click the next button to save text entered. ---caller states she had a nosebleed last night and was seen in the ER, is calling ot make a follow up appt for tomorrow. no new symptoms, has not had a nose bleed since. Has the patient traveled out of the country within the last 30 days? ---Not Applicable Does the patient have any new or worsening symptoms? ---No Please document clinical information provided and list any resource used. ---advised to call back if anything changes she gets worse, otherwise for appt call after 8am. verbalized understanding Guidelines Guideline Title Affirmed Question Affirmed Notes Nurse Date/Time (Eastern Time) Disp. Time Lamount Cohen(Eastern Time) Disposition Final User 02/07/2016 12:02:49 AM Clinical Call Yes Roma KayserForsythe, RN, Santina Evansatherine

## 2016-02-07 NOTE — Telephone Encounter (Signed)
Pt went to the ER over the weekend and is asking for appt . She need 30 minute do I schedule her  In the same day  Or schedule her later int the week. She wants to come in today    [phone]

## 2016-02-07 NOTE — Telephone Encounter (Signed)
   See below    Neurology saw her  In the ed .    Please make sure she has appt with neurology.  Can give her appt  For 30 minutes  None avialable todau Can use the 2 sdas in tomorrow afternoon  330 and 345      Imaging: CT of the head negative for acute pathology. MRI of the brain negative for acute stroke.    Assessment and plan:   Monique Nelson is an 50 y.o. female patient who presented with with subjective symptoms of left-sided weakness and numbness, patient is noted to be crying and had inconsistent neurologic examination suspicious for conversion disorder at initial presentation. CT of the head was negative. Because of the inconsistencies, a stat MRI was obtained to definitively rule out any intracranial pathology causing these left-sided symptoms. MRI of the brain was negative for acute stroke. No significant intracranial pathology is noted to account for the symptoms.  Reevaluation following the MRI showed the patient had normal strength, normal sensation, gait was normal and she is a symptomatic at baseline. This likely suggest conversion disorder which has resolved.  Patient is diabetic and reported having some burning paresthesias in her feet, worse on the left side. Recommend starting gabapentin 100 mg in the evening after dinner for symptomatic relief.  Follow-up with outpatient neurology.   She also reported having some nosebleeds yesterday. Will defer to Dr. Clarice PolePfeifer for further evaluation.  From neurological standpoint, no further neurodiagnostic testing recommended at this time. If medically stable, she can be discharged home and follow up with outpatient neurologist and primary care physician. Discussed this with patient and her daughter at bedside, answered several of their questions to their satisfaction. Patient understands that her symptoms were likely secondary to stress. Discussed with Dr. Clarice PolePfeifer.

## 2016-02-08 ENCOUNTER — Ambulatory Visit: Payer: 59 | Admitting: Internal Medicine

## 2016-02-08 DIAGNOSIS — R531 Weakness: Secondary | ICD-10-CM

## 2016-02-08 NOTE — Congregational Nurse Program (Signed)
Congregational Nurse Program Note  Date of Encounter: 02/08/2016  Past Medical History: Past Medical History  Diagnosis Date  . Vertigo     intermittent infrequent  . Drug addiction ([place])     recovering  . Hx of varicella   . Substance abuse in remission 13 years ago     cocaine heroiin  met   support via ADP  . Ex-smoker 2009  . History of fracture of wrist 17 years ago    has plate and pins right   . Hx of hysterectomy     bleeding and cysts  . Diabetes mellitus without complication [facility])     Encounter Details:     CNP Questionnaire - 02/08/16 1457    Patient Demographics   Is this a new or existing patient? Existing   Patient is considered a/an Not Applicable   Race African-American/Black   Patient Assistance   Location of Patient Assistance [name]   Patient's financial/insurance status Private Insurance Coverage   Uninsured Patient No   Patient referred to apply for the following financial assistance Not Applicable   Food insecurities addressed Not Applicable   Transportation assistance No   Assistance securing medications No   Educational health offerings Acute disease;Diabetes;Safety   Encounter Details   Primary purpose of visit Acute Illness/Condition Visit;Chronic Illness/Condition Visit   Was an Emergency Department visit averted? No   Does patient have a medical provider? Yes   Patient referred to Follow up with established PCP   Was a mental health screening completed? (GAINS tool) No   Does patient have dental issues? No   Does patient have vision issues? No   Since previous encounter, have you referred patient for abnormal blood pressure that resulted in a new diagnosis or medication change? No   Since previous encounter, have you referred patient for abnormal blood glucose that resulted in a new diagnosis or medication change? No   For Abstraction Use Only   Does patient have insurance? Yes     Followup with client after ER visit, brief  interaction.  Feeling better, still weakness in left side.  Has appt with neurologist on Friday.  Will followup with her after her neuro visit.

## 2016-02-10 NOTE — Progress Notes (Signed)
Pre visit review using our clinic review tool, if applicable. No additional management support is needed unless otherwise documented below in the visit note.  Chief Complaint  Patient presents with  . Follow-up    HPI: Monique Nelson 50 y.o.  Comes in for  fu ed 4 24 for  Neurologic sx   Onset. with ha and nasal bleed  2 day before visit  Both sized and down throat .   Then  Began left hand tingling for 2-3 days noted when trying to cut food in a salad plate   Sunday  and still had some form of a headache and went  to church    doesnt remember but had LOC and awake  And  Ems was called and couldn't see felt some panic  Not hear  No seizure activity   Couldn't feel left   Left.  Radial hand  Feels like needles   Little   Better .  No current weakness    Had mri brain  No acute findings  Neg labs troponin and mri without contrast.  Dm is ok . Given gabapentin for poss neuropathy .  Neuro consult  Felt conversion?   She states she paniced when she couldn't see well and  Called for her mom .  But  Now  i sok .  She has never had anything like this  . No palpations  extreme anxiety  Although has had stress from family illness.  Has osa and dm   Is still clean from sud .  ROS: See pertinent positives and negatives per HPI. No cp sob .   No vomiting and no fever  Past Medical History  Diagnosis Date  . Vertigo     intermittent infrequent  . Drug addiction ([place])     recovering  . Hx of varicella   . Substance abuse in remission 13 years ago     cocaine heroiin  met   support via ADP  . Ex-smoker 2009  . History of fracture of wrist 17 years ago    has plate and pins right   . Hx of hysterectomy     bleeding and cysts  . Diabetes mellitus without complication ([place])     Family History  Problem Relation Age of Onset  . COPD Mother   . Diabetes Mother   . Hypertension Mother   . Sleep apnea Mother     and sister   . Thyroid disease Mother   . Chronic fatigue Mother   .  Glaucoma Father   . Other Father     prostate disease  . Osteoarthritis Mother     knee replacement   . Alcohol abuse      drug parent    Social History   Social History  . Marital Status: Single    Spouse Name: N/A  . Number of Children: 1  . Years of Education: N/A   Occupational History  .  [employer]   Social History Main Topics  . Smoking status: Former Smoker -- 0.04 packs/day for 8 years    Types: Cigarettes    Start date: 10/21/2013  . Smokeless tobacco: Not on file     Comment: off and on x 8 yrs  . Alcohol Use: No  . Drug Use: Yes    Special: "Crack" cocaine, Heroin, Methamphetamines, Marijuana     Comment: History- 14 years clean-11/21/13  . Sexual Activity: Not on file   Other Topics Concern  .  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 acetaminophen (TYLENOL) 325 MG tablet Take 650 mg by mouth every 6 (six) hours as needed for moderate pain.    . BD PEN NEEDLE NANO U/F 32G X 4 MM MISC USE 1 PEN NEEDLE PER DAY WITH VICTOZA 50 each 3  . cetirizine (ZYRTEC) 10 MG tablet Take 10 mg by mouth daily.    Marland Kitchen gabapentin (NEURONTIN) 100 MG capsule Take 1 capsule (100 mg total) by mouth at bedtime. 30 capsule 0  . glucose blood (ONE TOUCH ULTRA TEST) test strip Use as instructed to check blood sugar once a day alternating meals dx code 790.29 50 each 3  . Insulin Pen Needle (BD PEN NEEDLE NANO U/F) 32G X 4 MM MISC Use one pen needle per day with Victoza 30 each 3  . Liraglutide 18 MG/3ML SOPN Inject 1.2 mg into the skin daily. 2 pen 0  . ONETOUCH DELICA LANCETS FINE MISC Use to obtain a blood specimen once a day dx code 790.29 100 each 1  . sodium chloride (OCEAN)  0.65 % SOLN nasal spray Place 1 spray into both nostrils as needed for congestion. 1 Bottle 0  . Tetrahydrozoline-Zn Sulfate (EYE DROPS ALLERGY RELIEF OP) Apply 2 drops to eye daily.     No facility-administered medications prior to visit.     EXAM:  BP 134/72 mmHg  Temp(Src) 98.8 F (37.1 C) (Oral)  Wt 263 lb 3.2 oz (119.387 kg)  LMP 12/15/2011  Body mass index is 48.13 kg/(m^2).  GENERAL: vitals reviewed and listed above, alert, oriented, appears well hydrated and in no acute distress HEENT: atraumatic, conjunctiva  clear, no obvious abnormalities on inspection of external nose and ears OP : no lesion edema or exudate  NECK: no obvious masses on inspection palpation  ? No bruit  Can hear heart sounds  LUNGS: clear to auscultation bilaterally, no wheezes, rales or rhonchi, good air movement CV: HRRR, no clubbing cyanosis or  peripheral edema nl cap refill  MS: moves all extremities without noticeable focal  Abnormality  Grip seems ok  Pulses nl  Nl gati no obv weaaknss  PSYCH: pleasant and cooperative, no obvious depression or anxiety Lab Results  Component Value Date   WBC 10.5 02/06/2016   HGB 14.6 02/06/2016   HCT 43.0 02/06/2016   PLT 389 02/06/2016   GLUCOSE 137* 02/06/2016   CHOL 179 07/02/2015   TRIG 171.0* 07/02/2015   HDL 36.20* 07/02/2015   LDLCALC 109* 07/02/2015   ALT 20 02/06/2016   AST 22 02/06/2016   NA 144 02/06/2016   K 3.7 02/06/2016   CL 108 02/06/2016   CREATININE 0.80 02/06/2016   BUN 18 02/06/2016   CO2 19* 02/06/2016   TSH 2.53 07/02/2015   INR 1.05 02/06/2016   HGBA1C 6.2 07/02/2015    ASSESSMENT AND PLAN:  Discussed the following assessment and plan:  Paresthesia - left hhand and foot  improving  predated the syncope.  Syncope, unspecified syncope type  Headache, unspecified headache type - see text  better no  hx migraines setc    Felt but neurologist consult to be stress and conversion?  But  That does not explain the etiology   Hx .   Still has slgiht tingling right nad thumb and foot but getting better .  Syncope was sudden  She doesent remember except couldn't see  Right after  And didn't feel herself pass out  No seizure activity witnessed .   -Patient advised to return or notify health care team  if symptoms worsen ,persist or new concerns arise.  Patient Instructions   Uncertain why    This happened  If occurs again get reevaluation  Emergently. Will refer for neuro consult asap. .    Lab Results  Component Value Date   WBC 10.5 02/06/2016   HGB 14.6 02/06/2016   HCT 43.0 02/06/2016   PLT 389 02/06/2016   GLUCOSE 137* 02/06/2016   CHOL 179 07/02/2015   TRIG 171.0* 07/02/2015   HDL 36.20* 07/02/2015   LDLCALC 109* 07/02/2015   ALT 20 02/06/2016   AST 22 02/06/2016   NA 144 02/06/2016   K 3.7 02/06/2016   CL 108 02/06/2016   CREATININE 0.80 02/06/2016   BUN 18 02/06/2016   CO2 19* 02/06/2016   TSH 2.53 07/02/2015   INR 1.05 02/06/2016   HGBA1C 6.2 07/02/2015        Wanda K. Panosh M.D.  Imaging: CT of the head negative for acute pathology. MRI of the brain negative for acute stroke.    Assessment and plan:   Monique Nelson is an 50 y.o. female patient who presented with with subjective symptoms of left-sided weakness and numbness, patient is noted to be crying and had inconsistent neurologic examination suspicious for conversion disorder at initial presentation. CT of the head was negative. Because of the inconsistencies, a stat MRI was obtained to definitively rule out any intracranial pathology causing these left-sided symptoms. MRI of the brain was negative for acute stroke. No significant intracranial pathology is noted to account for the symptoms.  Reevaluation following the MRI showed the patient had normal strength, normal sensation, gait was normal and she is a symptomatic at baseline. This likely suggest conversion disorder which has resolved.  Patient is diabetic and reported  having some burning paresthesias in her feet, worse on the left side. Recommend starting gabapentin 100 mg in the evening after dinner for symptomatic relief.  Follow-up with outpatient neurology.   She also reported having some nosebleeds yesterday. Will defer to Dr. Vallery Ridge for further evaluation.  From neurological standpoint, no further neurodiagnostic testing recommended at this time. If medically stable, she can be discharged home and follow up with outpatient neurologist and primary care physician. Discussed this with patient and her daughter at bedside, answered several of their questions to their satisfaction. Patient understands that her symptoms were likely secondary to stress. Discussed with Dr. Vallery Ridge.

## 2016-02-11 ENCOUNTER — Encounter: Payer: Self-pay | Admitting: Internal Medicine

## 2016-02-11 ENCOUNTER — Ambulatory Visit (INDEPENDENT_AMBULATORY_CARE_PROVIDER_SITE_OTHER): Payer: 59 | Admitting: Internal Medicine

## 2016-02-11 VITALS — BP 134/72 | Temp 98.8°F | Wt 263.2 lb

## 2016-02-11 DIAGNOSIS — R55 Syncope and collapse: Secondary | ICD-10-CM | POA: Diagnosis not present

## 2016-02-11 DIAGNOSIS — R202 Paresthesia of skin: Secondary | ICD-10-CM

## 2016-02-11 DIAGNOSIS — R51 Headache: Secondary | ICD-10-CM

## 2016-02-11 DIAGNOSIS — R519 Headache, unspecified: Secondary | ICD-10-CM

## 2016-02-11 NOTE — Patient Instructions (Signed)
Uncertain why    This happened  If occurs again get reevaluation  Emergently. Will refer for neuro consult asap. .    Lab Results  Component Value Date   WBC 10.5 02/06/2016   HGB 14.6 02/06/2016   HCT 43.0 02/06/2016   PLT 389 02/06/2016   GLUCOSE 137* 02/06/2016   CHOL 179 07/02/2015   TRIG 171.0* 07/02/2015   HDL 36.20* 07/02/2015   LDLCALC 109* 07/02/2015   ALT 20 02/06/2016   AST 22 02/06/2016   NA 144 02/06/2016   K 3.7 02/06/2016   CL 108 02/06/2016   CREATININE 0.80 02/06/2016   BUN 18 02/06/2016   CO2 19* 02/06/2016   TSH 2.53 07/02/2015   INR 1.05 02/06/2016   HGBA1C 6.2 07/02/2015

## 2016-03-09 ENCOUNTER — Encounter: Payer: Self-pay | Admitting: Neurology

## 2016-03-09 ENCOUNTER — Ambulatory Visit (INDEPENDENT_AMBULATORY_CARE_PROVIDER_SITE_OTHER): Payer: 59 | Admitting: Neurology

## 2016-03-09 VITALS — BP 126/80 | HR 93 | Ht 61.5 in | Wt 267.0 lb

## 2016-03-09 DIAGNOSIS — Z794 Long term (current) use of insulin: Secondary | ICD-10-CM | POA: Diagnosis not present

## 2016-03-09 DIAGNOSIS — G450 Vertebro-basilar artery syndrome: Secondary | ICD-10-CM

## 2016-03-09 DIAGNOSIS — E119 Type 2 diabetes mellitus without complications: Secondary | ICD-10-CM | POA: Diagnosis not present

## 2016-03-09 NOTE — Patient Instructions (Signed)
TIA is possible but it also could have been a basilar migraine. 1.  Recommend starting aspirin EC  daily to prevent possible TIAs or stroke 2.  Will check CTA of head and neck 3.  Will check 2D echocardiogram 4.  Exercise and weight loss 5.  Stop Goodys.  Instead, try Excedrin migraine but limit to no more than 2 days out of the week 6.  Mediterranean diet    Why follow it? Research shows. . Those who follow the Mediterranean diet have a reduced risk of heart disease  . The diet is associated with a reduced incidence of Parkinson's and Alzheimer's diseases . People following the diet may have longer life expectancies and lower rates of chronic diseases  . The Dietary Guidelines for Americans recommends the Mediterranean diet as an eating plan to promote health and prevent disease  What Is the Mediterranean Diet?  . Healthy eating plan based on typical foods and recipes of Mediterranean-style cooking . The diet is primarily a plant based diet; these foods should make up a majority of meals   Starches - Plant based foods should make up a majority of meals - They are an important sources of vitamins, minerals, energy, antioxidants, and fiber - Choose whole grains, foods high in fiber and minimally processed items  - Typical grain sources include wheat, oats, barley, corn, brown rice, bulgar, farro, millet, polenta, couscous  - Various types of beans include chickpeas, lentils, fava beans, black beans, white beans   Fruits  Veggies - Large quantities of antioxidant rich fruits & veggies; 6 or more servings  - Vegetables can be eaten raw or lightly drizzled with oil and cooked  - Vegetables common to the traditional Mediterranean Diet include: artichokes, arugula, beets, broccoli, brussel sprouts, cabbage, carrots, celery, collard greens, cucumbers, eggplant, kale, leeks, lemons, lettuce, mushrooms, okra, onions, peas, peppers, potatoes, pumpkin, radishes, rutabaga, shallots, spinach, sweet  potatoes, turnips, zucchini - Fruits common to the Mediterranean Diet include: apples, apricots, avocados, cherries, clementines, dates, figs, grapefruits, grapes, melons, nectarines, oranges, peaches, pears, pomegranates, strawberries, tangerines  Fats - Replace butter and margarine with healthy oils, such as olive oil, canola oil, and tahini  - Limit nuts to no more than a handful a day  - Nuts include walnuts, almonds, pecans, pistachios, pine nuts  - Limit or avoid candied, honey roasted or heavily salted nuts - Olives are central to the [employer] - can be eaten whole or used in a variety of dishes   Meats Protein - Limiting red meat: no more than a few times a month - When eating red meat: choose lean cuts and keep the portion to the size of deck of cards - Eggs: approx. 0 to 4 times a week  - Fish and lean poultry: at least 2 a week  - Healthy protein sources include, chicken, [place], lean beef, lamb - Increase intake of seafood such as tuna, salmon, trout, mackerel, shrimp, scallops - Avoid or limit high fat processed meats such as sausage and bacon  Dairy - Include moderate amounts of low fat dairy products  - Focus on healthy dairy such as fat free yogurt, skim milk, low or reduced fat cheese - Limit dairy products higher in fat such as whole or 2% milk, cheese, ice cream  Alcohol - Moderate amounts of red wine is ok  - No more than 5 oz daily for women (all ages) and men older than age 29  - No more than 10 oz of  wine daily for men younger than 5965  Other - Limit sweets and other desserts  - Use herbs and spices instead of salt to flavor foods  - Herbs and spices common to the traditional Mediterranean Diet include: basil, bay leaves, chives, cloves, cumin, fennel, garlic, lavender, marjoram, mint, oregano, parsley, pepper, rosemary, sage, savory, sumac, tarragon, thyme   It's not just a diet, it's a lifestyle:  . The Mediterranean diet includes lifestyle factors typical of  those in the region  . Foods, drinks and meals are best eaten with others and savored . Daily physical activity is important for overall good health . This could be strenuous exercise like running and aerobics . This could also be more leisurely activities such as walking, housework, yard-work, or taking the stairs . Moderation is the key; a balanced and healthy diet accommodates most foods and drinks . Consider portion sizes and frequency of consumption of certain foods   Meal Ideas & Options:  . Breakfast:  o Whole wheat toast or whole wheat English muffins with peanut butter & hard boiled egg o Steel cut oats topped with apples & cinnamon and skim milk  o Fresh fruit: banana, strawberries, melon, berries, peaches  o Smoothies: strawberries, bananas, greek yogurt, peanut butter o Low fat greek yogurt with blueberries and granola  o Egg white omelet with spinach and mushrooms o Breakfast couscous: whole wheat couscous, apricots, skim milk, cranberries  . Sandwiches:  o Hummus and grilled vegetables (peppers, zucchini, squash) on whole wheat bread   o Grilled chicken on whole wheat pita with lettuce, tomatoes, cucumbers or tzatziki  o Tuna salad on whole wheat bread: tuna salad made with greek yogurt, olives, red peppers, capers, green onions o Garlic rosemary lamb pita: lamb sauted with garlic, rosemary, salt & pepper; add lettuce, cucumber, greek yogurt to pita - flavor with lemon juice and black pepper  . Seafood:  o Mediterranean grilled salmon, seasoned with garlic, basil, parsley, lemon juice and black pepper o Shrimp, lemon, and spinach whole-grain pasta salad made with low fat greek yogurt  o Seared scallops with lemon orzo  o Seared tuna steaks seasoned salt, pepper, coriander topped with tomato mixture of olives, tomatoes, olive oil, minced garlic, parsley, green onions and cappers  . Meats:  o Herbed greek chicken salad with kalamata olives, cucumber, feta  o Red bell peppers  stuffed with spinach, bulgur, lean ground beef (or lentils) & topped with feta   o Kebabs: skewers of chicken, tomatoes, onions, zucchini, squash  o [place] burgers: made with red onions, mint, dill, lemon juice, feta cheese topped with roasted red peppers . Vegetarian o Cucumber salad: cucumbers, artichoke hearts, celery, red onion, feta cheese, tossed in olive oil & lemon juice  o Hummus and whole grain pita points with a greek salad (lettuce, tomato, feta, olives, cucumbers, red onion) o Lentil soup with celery, carrots made with vegetable broth, garlic, salt and pepper  o Tabouli salad: parsley, bulgur, mint, scallions, cucumbers, tomato, radishes, lemon juice, olive oil, salt and pepper. 7.  I want to check fasting lipid panel. 8.  Follow up in 4 to 5 months

## 2016-03-09 NOTE — Progress Notes (Signed)
NEUROLOGY CONSULTATION NOTE  Monique Nelson MRN: [id number] DOB: 01/11/1966  Referring provider: Dr. Regis Bill Primary care provider: Dr. Regis Bill  Reason for consult:  Headache, syncope  HISTORY OF PRESENT ILLNESS: Monique Nelson is a 50 year old right-handed woman with diabetes, OSA and history of substance abuse who presents for syncope and stroke-like event..  On the evening of Friday, 02/06/16, she developed a holocephalic and posterior throbbing headache.  She took pain reliever and went to sleep.  However, the headache persisted the next day.  On 02/06/16, she was in church.  The headache increased.  She reportedly had a "strange" expression and then she passed out.  When she woke up on the floor, everything was black and she was unable to see.  It took 15 to 20 minutes before she was able to see anything.  There were people around her.  She also was numb and weak in the left arm and leg.  She has history of vertigo but denied associated dizziness or spinning with this event.  On presentation in the ED, she was crying.  Blood pressure in ED was normal.  She exhibited inconsistencies on neurologic exam, in regards to weakness.  CT of head and MRI of brain were personally reviewed and were both normal.  CBC and CMP were unremarkable.  Urine drug screen was negative.  EKG was normal.  She continues to have holocephalic squeezing headache about 2 to 3 times a week.  She takes Goody powder, which helps.  She reports no history or family history of headache.  LDL from 07/02/15 was 109.  Hgb A1c was 6.2.  Currrent medication includes gabapentin 198m at bedtime  PAST MEDICAL HISTORY: Past Medical History  Diagnosis Date  . Vertigo     intermittent infrequent  . [place]     recover[place]  . Hx of varicella   . Substance abuse in remission 13 years ago     cocaine heroiin  met   support via ADP  . Ex-smoker 2009  . History of fracture of wrist 17 years ago    has plate and  pins right   . Hx of hysterectomy     bleeding and cysts  . Diabetes mellitus without complication ([place]     PAST SURGICAL HISTORY: Past Surgical History  Procedure Laterality Date  . Rt hand surgery      17 years ago  . Ablastion    . Abdominal hysterectomy      MEDICATIONS: Current Outpatient Prescriptions on File Prior to Visit  Medication Sig Dispense Refill  . acetaminophen (TYLENOL) 325 MG tablet Take 650 mg by mouth every 6 (six) hours as needed for moderate pain.    . BD PEN NEEDLE NANO U/F 32G X 4 MM MISC USE 1 PEN NEEDLE PER DAY WITH VICTOZA 50 each 3  . cetirizine (ZYRTEC) 10 MG tablet Take 10 mg by mouth daily.    .Marland Kitchengabapentin (NEURONTIN) 100 MG capsule Take 1 capsule (100 mg total) by mouth at bedtime. 30 capsule 0  . glucose blood (ONE TOUCH ULTRA TEST) test strip Use as instructed to check blood sugar once a day alternating meals dx code 790.29 50 each 3  . Insulin Pen Needle (BD PEN NEEDLE NANO U/F) 32G X 4 MM MISC Use one pen needle per day with Victoza 30 each 3  . Liraglutide 18 MG/3ML SOPN Inject 1.2 mg into the skin daily. 2 pen 0  . [place]  Use to obtain a blood specimen once a day dx code 790.29 100 each 1  . sodium chloride (OCEAN) 0.65 % SOLN nasal spray Place 1 spray into both nostrils as needed for congestion. 1 Bottle 0  . Tetrahydrozoline-Zn Sulfate (EYE DROPS ALLERGY RELIEF OP) Apply 2 drops to eye daily.     No current facility-administered medications on file prior to visit.    ALLERGIES: No Known Allergies  FAMILY HISTORY: Family History  Problem Relation Age of Onset  . COPD Mother   . Diabetes Mother   . Hypertension Mother   . Sleep apnea Mother     and sister   . Thyroid disease Mother   . Chronic fatigue Mother   . Glaucoma Father   . Other Father     prostate disease  . Osteoarthritis Mother     knee replacement   . Alcohol abuse      drug parent    SOCIAL HISTORY: Social History   Social History   . Marital Status: Single    Spouse Name: N/A  . Number of Children: 1  . Years of Education: N/A   Occupational History  .  [employer]   Social History Main Topics  . Smoking status: Former Smoker -- 0.04 packs/day for 8 years    Types: Cigarettes    Start date: 10/21/2013  . Smokeless tobacco: Not on file     Comment: off and on x 8 yrs  . Alcohol Use: No  . Drug Use: Yes    Special: "Crack" cocaine, Heroin, Methamphetamines, Marijuana     Comment: History- 14 years clean-11/21/13  . Sexual Activity: Not on file   Other Topics Concern  . 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   schooll major crm justice and marketing   Single neg tad except Garment/textile technologist education   Neg firearms    [place] of 1   Sleep about 5 hours    G2P1   Sees GYNE    REVIEW OF SYSTEMS: Constitutional: No fevers, chills, or sweats, no generalized fatigue, change in appetite Eyes: No visual changes, double vision, eye pain Ear, nose and throat: No hearing loss, ear pain, nasal congestion, sore throat Cardiovascular: No chest pain, palpitations Respiratory:  No shortness of breath at rest or with exertion, wheezes GastrointestinaI: No nausea, vomiting, diarrhea, abdominal pain, fecal incontinence Genitourinary:  No dysuria, urinary retention or frequency Musculoskeletal:  No neck pain, back pain Integumentary: No rash, pruritus, skin lesions Neurological: as above Psychiatric: No depression, insomnia, anxiety Endocrine: No palpitations, fatigue, diaphoresis, mood swings, change in appetite, change in weight, increased thirst Hematologic/Lymphatic:  No purpura, petechiae. Allergic/Immunologic: no itchy/runny eyes, nasal congestion, recent allergic reactions, rashes  PHYSICAL EXAM: Filed Vitals:   03/09/16 1028  BP: 126/80  Pulse: 93   General: No acute distress.  Patient appears well-groomed.  Morbidly obese Head:   Normocephalic/atraumatic Eyes:  fundi examined but not visualized Neck: supple, no paraspinal tenderness, full range of motion Back: No paraspinal tenderness Heart: regular rate and rhythm Lungs: Clear to auscultation bilaterally. Vascular: No carotid bruits. Neurological Exam: Mental status: alert and oriented to person, place, and time, recent and remote memory intact, fund of knowledge intact, attention and concentration intact, speech fluent and not dysarthric, language intact. Cranial nerves: CN I: not tested CN II: pupils equal, round and reactive to light, visual fields  intact CN III, IV, VI:  full range of motion, no nystagmus, no ptosis CN V: facial sensation intact CN VII: upper and lower face symmetric CN VIII: hearing intact CN IX, X: gag intact, uvula midline CN XI: sternocleidomastoid and trapezius muscles intact CN XII: tongue midline Bulk & Tone: normal, no fasciculations. Motor:  5/5 throughout Sensation:  Pinprick and vibration sensation intact. Deep Tendon Reflexes:  2+ throughout, toes downgoing.  Finger to nose testing:  Without dysmetria.  Heel to shin:  Without dysmetria.  Gait:  Normal station and stride.  Able to turn and tandem walk. Romberg negative.  IMPRESSION: 1.  Syncope with stroke-like event.  Given focal symptoms and her co-morbidities, I cannot rule out TIA. Neurology in the ED thought her symptoms were conversion.  Having vision loss is unusual.  But given constellation of symptoms (headache, syncope, vision loss and left sided numbness/weakness), a basilar migraine is also considered.   2.  Current headaches may be tension-type. 3.  Morbid obesity 4.  Diabetes   PLAN: 1.  Recommend starting ASA 73m daily 2.  Will check CTA of head and neck (particulary looking at the basilar artery), as well as 2D echo. 3.  Will check fasting lipid panel.  I am not sure if this was a TIA, but I would treat as such.  LDL goal should be less than 70. 4.  At  this time, she would not like to start a preventative medication for headache 5.  Stop Goodys.  Limit pain relievers to no more than 2 days out of the week. 6.  Continue glycemic control 7.  Diet and weight loss 8.  Follow up in 4 to 5 months.  Thank you for allowing me to take part in the care of this patient.  AMetta Clines DO  CC:  WShanon Ace MD

## 2016-03-10 ENCOUNTER — Telehealth: Payer: Self-pay | Admitting: Internal Medicine

## 2016-03-10 NOTE — Telephone Encounter (Signed)
Please confirm    Information Contact pateint  Message from scheduler  That she has had this before and  No se of  Flagyl  Ok to send in metronidazole  500 mg 1 po bid for 7 days  Disp 14  If recurring needs ov

## 2016-03-10 NOTE — Telephone Encounter (Signed)
Pt would like something calling into pharm for bacterial vaginosis. Pt has a history. [facility] [address] Pt does not have gyn. Pt is aware may need an appt.

## 2016-03-23 ENCOUNTER — Telehealth: Payer: Self-pay | Admitting: Neurology

## 2016-03-23 ENCOUNTER — Telehealth: Payer: Self-pay | Admitting: Pulmonary Disease

## 2016-03-23 NOTE — Telephone Encounter (Signed)
Spoke with pt and advised that since we have not seen her in over 2 years she will need an OV to follow up. Appt made with RA. Nothing further needed.

## 2016-03-23 NOTE — Telephone Encounter (Signed)
Monique Nelson, this note was closed and pt never received rx. Can you send this in? Pt called back and would like to know what is going on with this request.

## 2016-03-23 NOTE — Telephone Encounter (Signed)
Monique CampionLetitia Nelson 2066-09-26. She called saying that she needs to have a CT scan set up. She has [employer].  Her authorization is approved for 03/09/16 thru 04/23/16. Her number is [phone]. Thank you

## 2016-03-24 MED ORDER — METRONIDAZOLE 500 MG PO TABS: 500.0000 mg | ORAL_TABLET | Freq: Two times a day (BID) | ORAL | Status: DC

## 2016-03-24 NOTE — Telephone Encounter (Signed)
Sent to the pharmacy by e-scribe. 

## 2016-03-24 NOTE — Telephone Encounter (Signed)
[facility] contact [facility]

## 2016-03-24 NOTE — Telephone Encounter (Signed)
Spoke to the pt.  She has vaginal discharge that is white and thick but not a cottage cheese consistency.  Also has an unpleasant odor.   Denied fever and abdominal pain.

## 2016-03-24 NOTE — Addendum Note (Signed)
Addended by: Raj JanusADKINS, Colyn Miron T on: 03/24/2016 10:31 AM   Modules accepted: Orders

## 2016-04-03 ENCOUNTER — Ambulatory Visit
Admission: RE | Admit: 2016-04-03 | Discharge: 2016-04-03 | Disposition: A | Discharge status: Home / Self Care | Payer: 59 | Source: Ambulatory Visit | Attending: Neurology | Admitting: Neurology

## 2016-04-03 DIAGNOSIS — G450 Vertebro-basilar artery syndrome: Secondary | ICD-10-CM

## 2016-04-03 MED ORDER — IOPAMIDOL (ISOVUE-370) INJECTION 76%
100.0000 mL | Freq: Once | INTRAVENOUS | Status: AC | PRN
  Administered 2016-04-03: 100 mL via INTRAVENOUS

## 2016-04-04 ENCOUNTER — Telehealth: Payer: Self-pay

## 2016-04-04 NOTE — Telephone Encounter (Signed)
-----   Message from Drema DallasAdam R Jaffe, DO sent at 04/04/2016  7:05 AM EDT ----- CTA of head and neck are normal

## 2016-04-04 NOTE — Telephone Encounter (Signed)
Message relayed to patient. Verbalized understanding and denied questions.   

## 2016-04-08 ENCOUNTER — Emergency Department (HOSPITAL_COMMUNITY)
Admission: EM | Admit: 2016-04-08 | Discharge: 2016-04-08 | Disposition: A | Discharge status: Home / Self Care | Payer: 59 | Attending: Emergency Medicine | Admitting: Emergency Medicine

## 2016-04-08 ENCOUNTER — Emergency Department (HOSPITAL_COMMUNITY): Payer: 59

## 2016-04-08 ENCOUNTER — Encounter (HOSPITAL_COMMUNITY): Payer: Self-pay | Admitting: *Deleted

## 2016-04-08 DIAGNOSIS — E119 Type 2 diabetes mellitus without complications: Secondary | ICD-10-CM | POA: Insufficient documentation

## 2016-04-08 DIAGNOSIS — S90851A Superficial foreign body, right foot, initial encounter: Secondary | ICD-10-CM | POA: Diagnosis not present

## 2016-04-08 DIAGNOSIS — Z87891 Personal history of nicotine dependence: Secondary | ICD-10-CM | POA: Insufficient documentation

## 2016-04-08 DIAGNOSIS — W458XXA Other foreign body or object entering through skin, initial encounter: Secondary | ICD-10-CM | POA: Diagnosis not present

## 2016-04-08 DIAGNOSIS — Z79899 Other long term (current) drug therapy: Secondary | ICD-10-CM | POA: Insufficient documentation

## 2016-04-08 DIAGNOSIS — Y9301 Activity, walking, marching and hiking: Secondary | ICD-10-CM | POA: Insufficient documentation

## 2016-04-08 DIAGNOSIS — Y929 Unspecified place or not applicable: Secondary | ICD-10-CM | POA: Insufficient documentation

## 2016-04-08 DIAGNOSIS — Y999 Unspecified external cause status: Secondary | ICD-10-CM | POA: Insufficient documentation

## 2016-04-08 DIAGNOSIS — Z0389 Encounter for observation for other suspected diseases and conditions ruled out: Secondary | ICD-10-CM | POA: Insufficient documentation

## 2016-04-08 HISTORY — DX: Transient cerebral ischemic attack, unspecified: G45.9

## 2016-04-08 LAB — CBG MONITORING, ED: Glucose-Capillary: 160 mg/dL — ABNORMAL HIGH (ref 65–99)

## 2016-04-08 NOTE — ED Provider Notes (Signed)
CSN: [id number]     Arrival date & time 04/08/16  1918 History   First MD Initiated Contact with Patient 04/08/16 2228     Chief Complaint  Patient presents with  . Foot Problem     (Consider location/radiation/quality/duration/timing/severity/associated sxs/prior Treatment) HPI   50 year old female with history of non-insulin-dependent diabetes, substance abuse in remission and a recovering drug addict presenting for evaluation of potential foot infection. Patient states she was walking barefoot on her wooden deck last night and accidentally stepped on a splinter. She report acute onset of sharp pain to the affected area. She was able to remove a small splinter and clean her wound with alcohol wipes. Today she still endorsed some mild tenderness to the affected area and was concern for potential retained splinter. She mentioned having history of diabetes and was concern for potential infection. Otherwise patient has no other complaint.  Past Medical History  Diagnosis Date  . Vertigo     intermittent infrequent  . Drug addiction ([place])     recovering  . Hx of varicella   . Substance abuse in remission 13 years ago     cocaine heroiin  met   support via ADP  . Ex-smoker 2009  . History of fracture of wrist 17 years ago    has plate and pins right   . Hx of hysterectomy     bleeding and cysts  . Diabetes mellitus without complication ([place])   . TIA (transient ischemic attack)    Past Surgical History  Procedure Laterality Date  . Rt hand surgery      17 years ago  . Ablastion    . Abdominal hysterectomy     Family History  Problem Relation Age of Onset  . COPD Mother   . Diabetes Mother   . Hypertension Mother   . Sleep apnea Mother     and sister   . Thyroid disease Mother   . Chronic fatigue Mother   . Glaucoma Father   . Other Father     prostate disease  . Osteoarthritis Mother     knee replacement   . Alcohol abuse      drug parent   Social History  Substance  Use Topics  . Smoking status: Former Smoker -- 0.04 packs/day for 8 years    Types: Cigarettes    Start date: 10/21/2013  . Smokeless tobacco: None     Comment: off and on x 8 yrs  . Alcohol Use: No   OB History    No data available     Review of Systems  Constitutional: Negative for fever.  Skin: Positive for wound.  Neurological: Negative for numbness.      Allergies  Review of patient's allergies indicates no known allergies.  Home Medications   Prior to Admission medications   Medication Sig Start Date End Date Taking? Authorizing Provider  acetaminophen (TYLENOL) 325 MG tablet Take 650 mg by mouth every 6 (six) hours as needed for moderate pain.    Historical Provider, MD  BD PEN NEEDLE NANO U/F 32G X 4 MM MISC USE 1 PEN NEEDLE PER DAY WITH VICTOZA 05/18/15   Elayne Snare, MD  cetirizine (ZYRTEC) 10 MG tablet Take 10 mg by mouth daily.    Historical Provider, MD  gabapentin (NEURONTIN) 100 MG capsule Take 1 capsule (100 mg total) by mouth at bedtime. 02/06/16   Charlesetta Shanks, MD  glucose blood (ONE TOUCH ULTRA TEST) test strip Use as instructed to check  blood sugar once a day alternating meals dx code 790.29 01/28/14   Elayne Snare, MD  Insulin Pen Needle (BD PEN NEEDLE NANO U/F) 32G X 4 MM MISC Use one pen needle per day with Victoza 05/17/15   Elayne Snare, MD  Liraglutide 18 MG/3ML SOPN Inject 1.2 mg into the skin daily. 08/19/14   Elayne Snare, MD  metroNIDAZOLE (FLAGYL) 500 MG tablet Take 1 tablet (500 mg total) by mouth 2 (two) times daily. 03/24/16   Burnis Medin, MD  [facility] DELICA LANCETS FINE MISC Use to obtain a blood specimen once a day dx code 790.29 01/28/14   Elayne Snare, MD  sodium chloride (OCEAN) 0.65 % SOLN nasal spray Place 1 spray into both nostrils as needed for congestion. 02/06/16   Charlesetta Shanks, MD  Tetrahydrozoline-Zn Sulfate (EYE DROPS ALLERGY RELIEF OP) Apply 2 drops to eye daily.    Historical Provider, MD   BP 130/59 mmHg  Pulse 91  Temp(Src) 98.2 F  (36.8 C) (Oral)  Resp 18  Ht _0  (1.549 m)  Wt 122.925 kg  BMI 51.23 kg/m2  SpO2 95%  LMP 12/15/2011 Physical Exam  Constitutional: She appears well-developed and well-nourished. No distress.  HENT:  Head: Atraumatic.  Eyes: Conjunctivae are normal.  Neck: Neck supple.  Neurological: She is alert.  Skin: No rash noted.  Right foot: A small puncture wound noted to the ball of foot proximal to first toe with tenderness to palpation but no evidence of foreign body noted and no signs of infection.  Psychiatric: She has a normal mood and affect.  Nursing note and vitals reviewed.   ED Course  Procedures (including critical care time) Labs Review Labs Reviewed  CBG MONITORING, ED - Abnormal; Notable for the following:    Glucose-Capillary 160 (*)    All other components within normal limits    Imaging Review Dg Foot Complete Right  04/08/2016  CLINICAL DATA:  50 year old female with a history of toe injury. Penetrating trauma. EXAM: RIGHT FOOT COMPLETE - 3+ VIEW COMPARISON:  None. FINDINGS: No fracture identified. No radiopaque foreign body. No focal soft tissue swelling. Joints are congruent. Hallux valgus deformity. Degenerative changes of the interphalangeal joints. Mild degenerative changes of the hindfoot. IMPRESSION: No acute bony abnormality. No radiopaque foreign body. Signed, Dulcy Fanny. Earleen Newport, DO [facility] Electronically Signed   By: Corrie Mckusick D.O.   On: 04/08/2016 20:43   I have personally reviewed and evaluated these images and lab results as part of my medical decision-making.   EKG Interpretation None      MDM   Final diagnoses:  Splinter of foot, right, initial encounter    BP 130/59 mmHg  Pulse 91  Temp(Src) 98.2 F (36.8 C) (Oral)  Resp 18  Ht _1  (1.549 m)  Wt 122.925 kg  BMI 51.23 kg/m2  SpO2 95%  LMP 12/15/2011   10:42 PM Patient with history of diabetes here with concern for a  retained splinter in her right foot. She has a very small puncture wound and I have low suspicion for any foreign body in it. X-ray shows no acute finding and no foreign body. There is no signs of infection. I explained to patient that they may be a potential small splinter but this is usually self resolving. I do not think additional intervention is indicated at this time. Antibiotic is not indicated unless the signs of infection. I encouraged patient to soak feet in warm water with  Epsom salts, and keep close monitoring of her foot for any signs of infection.  Domenic Moras, PA-C 04/08/16 2245  Harvel Quale, MD 04/09/16 [phone]

## 2016-04-08 NOTE — ED Notes (Signed)
Pt states that she stepped on a splinter with her rt foot yesterday afternoon; pt states that she thought that she for the splinter out and soaked her foot last night; pt c/o redness and pain to rt foot below great toe; foot is tender to palpation; pt states that she is a diabetic and is concerned that she will get an infection

## 2016-04-08 NOTE — Discharge Instructions (Signed)
Sliver Removal, Care After A sliver--also called a splinter--is a small and thin broken piece of an object that gets stuck (embedded) under the skin. A sliver can create a deep wound that can easily become infected. It is important to care for the wound after a sliver is removed to help prevent infection and other problems from developing. WHAT TO EXPECT AFTER THE PROCEDURE Slivers often break into smaller pieces when they are removed. If pieces of your sliver broke off and stayed in your skin, you will eventually see them working themselves out and you may feel some pain at the wound site. This is normal. HOME CARE INSTRUCTIONS  Keep all follow-up visits as directed by your health care provider. This is important.  There are many different ways to close and cover a wound, including stitches (sutures) and adhesive strips. Follow your health care provider's instructions about:  Wound care.  Bandage (dressing) changes and removal.  Wound closure removal.  Check the wound site every day for signs of infection. Watch for:  Red streaks coming from the wound.  Fever.  Redness or tenderness around the wound.  Fluid, blood, or pus coming from the wound.  A bad smell coming from the wound. SEEK MEDICAL CARE IF:  You think that a piece of the sliver is still in your skin.  Your wound was closed, as with sutures, and the edges of the wound break open.  You have signs of infection, including:  New or worsening redness around the wound.  New or worsening tenderness around the wound.  Fluid, blood, or pus coming from the wound.  A bad smell coming from the wound or dressing. SEEK IMMEDIATE MEDICAL CARE IF: You have any of the following signs of infection:  Red streaks coming from the wound.  An unexplained fever.   This information is not intended to replace advice given to you by your health care provider. Make sure you discuss any questions you have with your health care  provider.   Document Released: 09/29/2000 Document Revised: 10/23/2014 Document Reviewed: 06/04/2014 Elsevier Interactive Patient Education 2016 Elsevier Inc.  

## 2016-04-11 ENCOUNTER — Ambulatory Visit (HOSPITAL_COMMUNITY): Payer: 59 | Attending: Cardiology

## 2016-04-11 ENCOUNTER — Other Ambulatory Visit: Payer: Self-pay

## 2016-04-11 ENCOUNTER — Telehealth: Payer: Self-pay

## 2016-04-11 DIAGNOSIS — G450 Vertebro-basilar artery syndrome: Secondary | ICD-10-CM | POA: Diagnosis not present

## 2016-04-11 DIAGNOSIS — G459 Transient cerebral ischemic attack, unspecified: Secondary | ICD-10-CM | POA: Diagnosis present

## 2016-04-11 DIAGNOSIS — I517 Cardiomegaly: Secondary | ICD-10-CM | POA: Diagnosis not present

## 2016-04-11 LAB — ECHOCARDIOGRAM COMPLETE
CHL CUP TV REG PEAK VELOCITY: 176 cm/s
E decel time: 190 msec
EERAT: 10
FS: 30 % (ref 28–44)
IV/PV OW: 0.97
LA vol index: 20 mL/m2
LADIAMINDEX: 1.81 cm/m2
LASIZE: 39 mm
LAVOL: 43 mL
LAVOLA4C: 39 mL
LDCA: 3.14 cm2
LEFT ATRIUM END SYS DIAM: 39 mm
LV E/e' medial: 10
LV E/e'average: 10
LV TDI E'MEDIAL: 8.11
LV e' LATERAL: 10.3 cm/s
LVOT SV: 76 mL
LVOT VTI: 24.1 cm
LVOT diameter: 20 mm
LVOT peak vel: 106 cm/s
MV Dec: 190
MV pk E vel: 103 m/s
MVPG: 4 mmHg
MVPKAVEL: 89.8 m/s
PW: 12.5 mm — AB (ref 0.6–1.1)
TDI e' lateral: 10.3
TRMAXVEL: 176 cm/s

## 2016-04-11 NOTE — Telephone Encounter (Signed)
VM box was full. Will attempt again later.

## 2016-04-11 NOTE — Telephone Encounter (Signed)
Pt called back. Message relayed.

## 2016-04-11 NOTE — Telephone Encounter (Deleted)
Message relayed to patient. Verbalized understanding and denied questions.   

## 2016-04-11 NOTE — Telephone Encounter (Signed)
-----   Message from Drema DallasAdam R Jaffe, DO sent at 04/11/2016 12:09 PM EDT ----- Echo looks okay

## 2016-04-21 ENCOUNTER — Telehealth: Payer: Self-pay | Admitting: Endocrinology

## 2016-04-21 ENCOUNTER — Telehealth: Payer: Self-pay

## 2016-04-21 MED ORDER — GLIMEPIRIDE 2 MG PO TABS: 2.0000 mg | ORAL_TABLET | Freq: Every day | ORAL | Status: DC

## 2016-04-21 NOTE — Telephone Encounter (Signed)
I have sent a prescription to your pharmacy, to add "glimepiride." Please see Dr Lucianne MussKumar for f/u appt next available.

## 2016-04-21 NOTE — Telephone Encounter (Signed)
See note below. Could you please advise during Dr. Remus BlakeKumar's absence? Thanks!

## 2016-04-21 NOTE — Telephone Encounter (Signed)
Pt has been feeling bad the past few days she checked her sugar today and it was 236 this AM, then 200 at lunch, and was 216 at 2:46 pm please advise what to do.

## 2016-04-21 NOTE — Addendum Note (Signed)
Addended by: Romero BellingELLISON, Trevonn Hallum on: 04/21/2016 04:21 PM   Modules accepted: Orders

## 2016-04-21 NOTE — Telephone Encounter (Signed)
Called patient back to notify her of Dr.Ellison to add a medication in Dr.Kumar's absence. Patient agreed to pick up medication, and start tomorrow. Got patient an appointment to see Dr. Lucianne MussKumar at his next available appointment.

## 2016-04-23 DIAGNOSIS — Z719 Counseling, unspecified: Secondary | ICD-10-CM

## 2016-05-14 DIAGNOSIS — E119 Type 2 diabetes mellitus without complications: Secondary | ICD-10-CM

## 2016-05-17 ENCOUNTER — Ambulatory Visit (INDEPENDENT_AMBULATORY_CARE_PROVIDER_SITE_OTHER): Payer: 59 | Admitting: Endocrinology

## 2016-05-17 ENCOUNTER — Other Ambulatory Visit: Payer: Self-pay

## 2016-05-17 ENCOUNTER — Encounter: Payer: Self-pay | Admitting: Endocrinology

## 2016-05-17 VITALS — BP 134/80 | HR 83 | Ht 61.0 in | Wt 267.0 lb

## 2016-05-17 DIAGNOSIS — E119 Type 2 diabetes mellitus without complications: Secondary | ICD-10-CM

## 2016-05-17 DIAGNOSIS — E7800 Pure hypercholesterolemia, unspecified: Secondary | ICD-10-CM | POA: Diagnosis not present

## 2016-05-17 LAB — POCT GLYCOSYLATED HEMOGLOBIN (HGB A1C): Hemoglobin A1C: 6.3

## 2016-05-17 NOTE — Progress Notes (Signed)
Patient ID: Monique Nelson, female   DOB: 1966-06-30, 50 y.o.   MRN: [id number]    Reason for Appointment : Followup for Type 2 Diabetes  History of Present Illness          Diagnosis: Type 2 diabetes mellitus, date of diagnosis: 2015       Past history: She has had impaired fasting glucose as per her record since about 2009 with the highest level being 110 Has not had any nutritional counseling or any specific management of this previously On her routine exam her A1c was found to be 6.6 in 11/2013 and random glucose was 112  Recent history:   She is again noncompliant with her follow-up and has not been seen for about a year She was given Amaryl 2 mg by her PCP, could not tolerate metformin  Current blood sugar patterns and problems identified: She has gained weight since her last visit  She has not been exercising even though last year she had started going to the gym  She was told to increase Victoza to 1.2 and then 1.8 but still taking 0.6  She does not think she is consistent with her diet even though she did see the dietitian last year  She is using expired test strips and even though blood sugars are recently looking fairly good she had high readings on a couple of days last month   Oral hypoglycemic drugs the patient is taking are: Amaryl 2 mg daily     Side effects from medications have been: GI side effects from metformin  Glucose monitoring with One Touch ultra:  as above, overall median reading 129  Glycemic control:   Lab Results  Component Value Date   HGBA1C 6.2 07/02/2015   HGBA1C 6.1 05/12/2015   HGBA1C 6.7 (H) 12/02/2014   Lab Results  Component Value Date   LDLCALC 109 (H) 07/02/2015   CREATININE 0.80 02/06/2016    Self-care: The diet that the patient has been following is: tries to limit fat intake; for breakfast is having egg whites or [place] sausage Usually having a salad or meat and vegetables at lunch, snacks will be fruit  occasionally  Meals: 3 meals per day.           Exercise: Now not going to the gym      Dietician visit: Most recent: 8/16.               Compliance with the medical regimen: fair    Weight history:  Wt Readings from Last 3 Encounters:  05/17/16 267 lb (121.1 kg)  04/08/16 271 lb (122.9 kg)  03/09/16 267 lb (121.1 kg)      Medication List       Accurate as of 05/17/16 11:53 AM. Always use your most recent med list.          acetaminophen 325 MG tablet Commonly known as:  TYLENOL Take 650 mg by mouth every 6 (six) hours as needed for moderate pain.   cetirizine 10 MG tablet Commonly known as:  ZYRTEC Take 10 mg by mouth daily.   EYE DROPS ALLERGY RELIEF OP Apply 2 drops to eye daily.   gabapentin 100 MG capsule Commonly known as:  NEURONTIN Take 1 capsule (100 mg total) by mouth at bedtime.   glimepiride 2 MG tablet Commonly known as:  AMARYL Take 1 tablet (2 mg total) by mouth daily before breakfast.   glucose blood test strip Commonly known as:  ONE TOUCH  ULTRA TEST Use as instructed to check blood sugar once a day alternating meals dx code 790.29   Insulin Pen Needle 32G X 4 MM Misc Commonly known as:  BD PEN NEEDLE NANO U/F Use one pen needle per day with Victoza   BD PEN NEEDLE NANO U/F 32G X 4 MM Misc Generic drug:  Insulin Pen Needle USE 1 PEN NEEDLE PER DAY WITH VICTOZA   Liraglutide 18 MG/3ML Sopn Inject 1.2 mg into the skin daily.   metroNIDAZOLE 500 MG tablet Commonly known as:  FLAGYL Take 1 tablet (500 mg total) by mouth 2 (two) times daily.   ONETOUCH DELICA LANCETS FINE Misc Use to obtain a blood specimen once a day dx code 790.29   sodium chloride 0.65 % Soln nasal spray Commonly known as:  OCEAN Place 1 spray into both nostrils as needed for congestion.       Allergies: No Known Allergies  Past Medical History:  Diagnosis Date  . Diabetes mellitus without complication ([place])   . Drug addiction ([place])    recovering  .  Ex-smoker 2009  . History of fracture of wrist 17 years ago   has plate and pins right   . Hx of hysterectomy    bleeding and cysts  . Hx of varicella   . Substance abuse in remission 13 years ago    cocaine heroiin  met   support via ADP  . TIA (transient ischemic attack)   . Vertigo    intermittent infrequent    Past Surgical History:  Procedure Laterality Date  . ABDOMINAL HYSTERECTOMY    . ablastion    . rt hand surgery     17 years ago    Family History  Problem Relation Age of Onset  . COPD Mother   . Diabetes Mother   . Hypertension Mother   . Sleep apnea Mother     and sister   . Thyroid disease Mother   . Chronic fatigue Mother   . Glaucoma Father   . Other Father     prostate disease  . Osteoarthritis Mother     knee replacement   . Alcohol abuse      drug parent    Social History:  reports that she has quit smoking. Her smoking use included Cigarettes. She started smoking about 2 years ago. She has a 0.32 pack-year smoking history. She does not have any smokeless tobacco history on file. She reports that she uses drugs, including "Crack" cocaine, Heroin, Methamphetamines, and Marijuana. She reports that she does not drink alcohol.    Review of Systems       Lipids: As follows, currently not on any statin drugs   Lab Results  Component Value Date   CHOL 179 07/02/2015   HDL 36.20 (L) 07/02/2015   LDLCALC 109 (H) 07/02/2015   TRIG 171.0 (H) 07/02/2015   CHOLHDL 5 07/02/2015              She has known sleep apnea     Physical Examination:  BP 134/80 (BP Location: Left Arm, Patient Position: Sitting, Cuff Size: Normal)   Pulse 83   Ht _0  (1.549 m)   Wt 267 lb (121.1 kg)   LMP 12/15/2011   SpO2 94%   BMI 50.45 kg/m     No ankle edema present  ASSESSMENT/PLAN:   Diabetes type 2, mild and well controlled with obesity  Although her A1c is fairly good at 6.3 she is continuing to  gain weight She is again noncompliant with watching her  diet even though she has been to the dietitian Again noncompliant with exercise regimen She did not follow-up as scheduled and has been a year since she was seen  As discussed above she was advised to increase her Victoza to the maximum dose to help with weight loss but she is not doing this and only taking 0.6 again  Discussed with her that with her progressive weight gain she is likely to get more insulin resistant and will have difficulty controlling her diabetes in the future  Again recommended that she increase her Victoza for the maximum dose She will be given a One Touch Verio monitor and discussed when to check her blood sugar and blood sugar targets She will start exercise regimen Restart diet as before She will look into Saxenda if it is covered, will discuss on the next visit if she tolerates 1.8 mg Victoza, discussed the differences between the 2 drugs  She also needs follow-up evaluation of her urine microalbumin   HYPERLIPIDEMIA: Needs follow-up evaluation  Counseling time on subjects discussed above is over 50% of today's 25 minute visit  Roxy Mastandrea 05/17/2016, 11:53 AM

## 2016-05-17 NOTE — Patient Instructions (Addendum)
Check coverage for Saxenda  Restart exercise  Check blood sugars on waking up    Also check blood sugars about 2 hours after a meal and do this after different meals by rotation  Recommended blood sugar levels on waking up is 90-130 and about 2 hours after meal is 130-160  Please bring your blood sugar monitor to each visit, thank you  Victoza 1.2 mg for 5 days and then 1.8

## 2016-05-17 NOTE — Addendum Note (Signed)
Addended by: Ann Maki T on: 05/17/2016 12:03 PM   Modules accepted: Orders

## 2016-05-18 ENCOUNTER — Encounter: Payer: Self-pay | Admitting: Pulmonary Disease

## 2016-05-18 ENCOUNTER — Ambulatory Visit (INDEPENDENT_AMBULATORY_CARE_PROVIDER_SITE_OTHER): Payer: 59 | Admitting: Pulmonary Disease

## 2016-05-18 ENCOUNTER — Other Ambulatory Visit: Payer: Self-pay

## 2016-05-18 DIAGNOSIS — G4733 Obstructive sleep apnea (adult) (pediatric): Secondary | ICD-10-CM | POA: Diagnosis not present

## 2016-05-18 MED ORDER — LIRAGLUTIDE 18 MG/3ML ~~LOC~~ SOPN: 1.8000 mg | PEN_INJECTOR | Freq: Every day | SUBCUTANEOUS | 2 refills | Status: DC

## 2016-05-18 MED ORDER — GLUCOSE BLOOD VI STRP: ORAL_STRIP | 2 refills | Status: DC

## 2016-05-18 NOTE — Patient Instructions (Signed)
  Your machine model is Fisher-Paykel It is set at 7 cm-we will ask DME to increase to 9 cm  CPAP supplies will be provided including a small fullface mask.  We will check the download in one month

## 2016-05-18 NOTE — Assessment & Plan Note (Signed)
  Your machine model is Fisher-Paykel It is set at 7 cm-we will ask DME to increase to 9 cm  CPAP supplies will be provided including a small fullface mask.  We will check the download in one month  Weight loss encouraged, compliance with goal of at least 4-6 hrs every night is the expectation. Advised against medications with sedative side effects Cautioned against driving when sleepy - understanding that sleepiness will vary on a day to day basis

## 2016-05-18 NOTE — Progress Notes (Signed)
   Subjective:    Patient ID: Monique Nelson, female    DOB: 1966/07/03, 50 y.o.   MRN: [id number]  HPI  49 year old obese woman for FU of obstructive sleep apnea and insomnia.  She also reports difficulty maintaining sleep in spite of several sleep aids such as melatonin, sominex and Benadryl.  She quit using drugs 14 years ago and quit smoking in 10/2013 after 30-pack-years.  05/18/2016  Chief Complaint  Patient presents with  . Sleep Disorder    Last seen 2015. Currently using CPAP machine not like she should.    She was never able to obtain a CPAP machine and 2015 due to cost She is now obtained a spare Fisher-Paykel CPAP from her sister that is set at 7 cm this has a small nasal mask. She would like to get started on this  Her weight is meanwhile unchanged, snoring has gotten worse as has daytime fatigue and somnolence Diabetes is uncontrolled sugar in the 250 range  Significant tests/ events  Home study 12/2013 showed severe OSA - AHI 56/h 01/2014 Titration >CPAP  9 cm with a small fullface mask  Review of Systems neg for any significant sore throat, dysphagia, itching, sneezing, nasal congestion or excess/ purulent secretions, fever, chills, sweats, unintended wt loss, pleuritic or exertional cp, hempoptysis, orthopnea pnd or change in chronic leg swelling. Also denies presyncope, palpitations, heartburn, abdominal pain, nausea, vomiting, diarrhea or change in bowel or urinary habits, dysuria,hematuria, rash, arthralgias, visual complaints, headache, numbness weakness or ataxia.     Objective:   Physical Exam  Gen. Pleasant, obese, in no distress ENT - no lesions, no post nasal drip Neck: No JVD, no thyromegaly, no carotid bruits Lungs: no use of accessory muscles, no dullness to percussion, decreased without rales or rhonchi  Cardiovascular: Rhythm regular, heart sounds  normal, no murmurs or gallops, no peripheral edema Musculoskeletal: No deformities, no cyanosis or  clubbing , no tremors       Assessment & Plan:

## 2016-05-18 NOTE — Assessment & Plan Note (Signed)
Weight loss encouraged 

## 2016-05-19 ENCOUNTER — Encounter: Payer: Self-pay | Admitting: Pulmonary Disease

## 2016-05-19 NOTE — Congregational Nurse Program (Signed)
Congregational Nurse Program Note  Date of Encounter: 04/23/2016  Past Medical History: Past Medical History:  Diagnosis Date  . Diabetes mellitus without complication ([place])   . Drug addiction ([place])    recovering  . Ex-smoker 2009  . History of fracture of wrist 17 years ago   has plate and pins right   . Hx of hysterectomy    bleeding and cysts  . Hx of varicella   . Substance abuse in remission 13 years ago    cocaine heroiin  met   support via ADP  . TIA (transient ischemic attack)   . Vertigo    intermittent infrequent    Encounter Details:     CNP Questionnaire - 04/23/16 0224      Patient Demographics   Is this a new or existing patient? Existing   Patient is considered a/an Not Applicable   Race African-American/Black     Patient Assistance   Location of Patient Assistance [name]   Patient's financial/insurance status Private Insurance Coverage   Uninsured Patient No   Patient referred to apply for the following financial assistance Not Applicable   Food insecurities addressed Not Applicable   Transportation assistance No   Assistance securing medications No   Educational health offerings Acute disease;Diabetes;Safety;Chronic disease     Encounter Details   Primary purpose of visit Acute Illness/Condition Visit;Chronic Illness/Condition Visit   Was an Emergency Department visit averted? No   Does patient have a medical provider? Yes   Patient referred to Follow up with established PCP   Was a mental health screening completed? (GAINS tool) No   Does patient have dental issues? No   Does patient have vision issues? No   Does your patient have an abnormal blood pressure today? No   Since previous encounter, have you referred patient for abnormal blood pressure that resulted in a new diagnosis or medication change? No   Does your patient have an abnormal blood glucose today? No   Since previous encounter, have you referred patient for abnormal blood  glucose that resulted in a new diagnosis or medication change? No   Was there a life-saving intervention made? No      Client reports her foot is doing better,  Still some pain, but not using splint now.  Having some issue with diabetic medication.

## 2016-05-23 NOTE — Congregational Nurse Program (Signed)
Congregational Nurse Program Note  Date of Encounter: 05/14/2016  Past Medical History: Past Medical History:  Diagnosis Date  . Diabetes mellitus without complication ([name])   . Drug addiction ([name])    recovering  . Ex-smoker 2009  . History of fracture of wrist 17 years ago   has plate and pins right   . Hx of hysterectomy    bleeding and cysts  . Hx of varicella   . Substance abuse in remission 13 years ago    cocaine heroiin  met   support via ADP  . TIA (transient ischemic attack)   . Vertigo    intermittent infrequent    Encounter Details:     CNP Questionnaire - 05/14/16 0108      Patient Demographics   Is this a new or existing patient? Existing   Patient is considered a/an Not Applicable   Race African-American/Black     Patient Assistance   Location of Patient Assistance [name]   Patient's financial/insurance status Private Insurance Coverage   Uninsured Patient No   Patient referred to apply for the following financial assistance Not Applicable   Food insecurities addressed Not Applicable   Transportation assistance No   Assistance securing medications Yes   Type of Assistance Other  donor   Educational health offerings Acute disease;Diabetes;Safety;Chronic disease     Encounter Details   Primary purpose of visit Acute Illness/Condition Visit;Chronic Illness/Condition Visit   Was an Emergency Department visit averted? No   Does patient have a medical provider? Yes   Patient referred to Follow up with established PCP   Was a mental health screening completed? (GAINS tool) No   Does patient have dental issues? No   Does patient have vision issues? No   Does your patient have an abnormal blood pressure today? No   Since previous encounter, have you referred patient for abnormal blood pressure that resulted in a new diagnosis or medication change? No   Does your patient have an abnormal blood glucose today? No   Since previous encounter, have you  referred patient for abnormal blood glucose that resulted in a new diagnosis or medication change? No   Was there a life-saving intervention made? No      Client seen at [facility].  Feels like her foot is better, but still tender.  She was given needles obtained with donor funding.  Encouraged to call if she needs other help with meds or education.  Client reports gi symptoms still problematic.  Referred to medical dr.and to convey severity of side effects.

## 2016-05-27 DIAGNOSIS — E119 Type 2 diabetes mellitus without complications: Secondary | ICD-10-CM

## 2016-05-30 NOTE — Congregational Nurse Program (Signed)
Congregational Nurse Program Note  Date of Encounter: 05/27/2016  Past Medical History: Past Medical History:  Diagnosis Date  . Diabetes mellitus without complication ([place])   . Drug addiction ([place])    recovering  . Ex-smoker 2009  . History of fracture of wrist 17 years ago   has plate and pins right   . Hx of hysterectomy    bleeding and cysts  . Hx of varicella   . Substance abuse in remission 13 years ago    cocaine heroiin  met   support via ADP  . TIA (transient ischemic attack)   . Vertigo    intermittent infrequent    Encounter Details:     CNP Questionnaire - 05/27/16 2357      Patient Demographics   Is this a new or existing patient? Existing   Patient is considered a/an Not Applicable   Race African-American/Black     Patient Assistance   Location of Patient Assistance [name]   Patient's financial/insurance status Private Insurance Coverage   Uninsured Patient No   Patient referred to apply for the following financial assistance Not Applicable   Food insecurities addressed Not Applicable   Transportation assistance No   Assistance securing medications Yes   Type of Assistance Other  donor   Educational health offerings Acute disease;Diabetes;Safety;Chronic disease     Encounter Details   Primary purpose of visit Acute Illness/Condition Visit;Chronic Illness/Condition Visit   Was an Emergency Department visit averted? No   Does patient have a medical provider? Yes   Patient referred to Medication Assistance Programs   Was a mental health screening completed? (GAINS tool) No   Does patient have dental issues? No   Does patient have vision issues? No   Does your patient have an abnormal blood pressure today? No   Since previous encounter, have you referred patient for abnormal blood pressure that resulted in a new diagnosis or medication change? No   Does your patient have an abnormal blood glucose today? No   Since previous encounter, have you  referred patient for abnormal blood glucose that resulted in a new diagnosis or medication change? No   Was there a life-saving intervention made? No      Client seen at [facility].  Concerned about her father who has lung cancer and undergoing treatment.  He is having difficulty and she feels unable to help at times.  Offered time to ventilate and encourage.  She is having problems with her diabetes medications.  She has gi problems and has diarrhea and upset stomach with she eats fried foods or spicy foods.  So of the medications seem to make her nauseous.  She has recently had a change and hopes this improves.  She wants to loose weight as she is at 275 lbs.  She feels discouuraged at times.  Tia wants to pursue a nursing degree.  She has not made contact with resource I gave her, but will.  Will followup prn

## 2016-06-01 ENCOUNTER — Other Ambulatory Visit: Payer: Self-pay | Admitting: Endocrinology

## 2016-06-01 NOTE — Telephone Encounter (Signed)
Please advise 

## 2016-06-01 NOTE — Telephone Encounter (Signed)
[facility]  Insurance will not cover patient medication Victoza unless it is a lower  a lower dosage. Please advise [phone] (Phone)

## 2016-06-01 NOTE — Telephone Encounter (Signed)
I contacted the pt and advised we have submitted her prescription and to let us know if she has any issues picking the med up.

## 2016-06-01 NOTE — Telephone Encounter (Signed)
PT called and is very upset and worried because her sugar is rising and the pharmacy keeps telling her that they don't have her medication and they haven't heard anything from us.  She is requesting a call back asap, please.

## 2016-06-03 DIAGNOSIS — E119 Type 2 diabetes mellitus without complications: Secondary | ICD-10-CM

## 2016-06-23 ENCOUNTER — Other Ambulatory Visit (INDEPENDENT_AMBULATORY_CARE_PROVIDER_SITE_OTHER): Payer: 59

## 2016-06-23 DIAGNOSIS — E119 Type 2 diabetes mellitus without complications: Secondary | ICD-10-CM | POA: Diagnosis not present

## 2016-06-23 LAB — COMPREHENSIVE METABOLIC PANEL
ALBUMIN: 3.9 g/dL (ref 3.5–5.2)
ALT: 13 U/L (ref 0–35)
AST: 13 U/L (ref 0–37)
Alkaline Phosphatase: 69 U/L (ref 39–117)
BILIRUBIN TOTAL: 0.2 mg/dL (ref 0.2–1.2)
BUN: 15 mg/dL (ref 6–23)
CALCIUM: 9.2 mg/dL (ref 8.4–10.5)
CHLORIDE: 105 meq/L (ref 96–112)
CO2: 29 mEq/L (ref 19–32)
CREATININE: 0.81 mg/dL (ref 0.40–1.20)
GFR: 96.04 mL/min (ref >60.00)
Glucose, Bld: 82 mg/dL (ref 70–99)
Potassium: 4 mEq/L (ref 3.5–5.1)
Sodium: 137 mEq/L (ref 135–145)
Total Protein: 7.9 g/dL (ref 6.0–8.3)

## 2016-06-23 LAB — LIPID PANEL
CHOLESTEROL: 178 mg/dL (ref 0–200)
HDL: 36.3 mg/dL — ABNORMAL LOW (ref >39.00)
LDL CALC: 123 mg/dL — AB (ref 0–99)
NonHDL: 141.48
TRIGLYCERIDES: 93 mg/dL (ref 0.0–149.0)
Total CHOL/HDL Ratio: 5
VLDL: 18.6 mg/dL (ref 0.0–40.0)

## 2016-06-24 LAB — FRUCTOSAMINE: Fructosamine: 201 umol/L (ref 0–285)

## 2016-06-28 ENCOUNTER — Ambulatory Visit: Payer: 59 | Admitting: Endocrinology

## 2016-06-29 ENCOUNTER — Encounter: Payer: Self-pay | Admitting: *Deleted

## 2016-07-07 ENCOUNTER — Encounter (HOSPITAL_COMMUNITY): Payer: Self-pay | Admitting: *Deleted

## 2016-07-07 ENCOUNTER — Emergency Department (HOSPITAL_COMMUNITY): Payer: 59

## 2016-07-07 ENCOUNTER — Emergency Department (HOSPITAL_COMMUNITY)
Admission: EM | Admit: 2016-07-07 | Discharge: 2016-07-07 | Disposition: A | Discharge status: Home / Self Care | Payer: 59 | Attending: Emergency Medicine | Admitting: Emergency Medicine

## 2016-07-07 DIAGNOSIS — R0789 Other chest pain: Secondary | ICD-10-CM | POA: Diagnosis not present

## 2016-07-07 DIAGNOSIS — Z8673 Personal history of transient ischemic attack (TIA), and cerebral infarction without residual deficits: Secondary | ICD-10-CM | POA: Diagnosis not present

## 2016-07-07 DIAGNOSIS — E119 Type 2 diabetes mellitus without complications: Secondary | ICD-10-CM | POA: Diagnosis not present

## 2016-07-07 DIAGNOSIS — Z7984 Long term (current) use of oral hypoglycemic drugs: Secondary | ICD-10-CM | POA: Diagnosis not present

## 2016-07-07 DIAGNOSIS — Z87891 Personal history of nicotine dependence: Secondary | ICD-10-CM | POA: Insufficient documentation

## 2016-07-07 LAB — TROPONIN I: Troponin I: <0.03 ng/mL (ref <0.03)

## 2016-07-07 LAB — BASIC METABOLIC PANEL
ANION GAP: 7 (ref 5–15)
BUN: 13 mg/dL (ref 6–20)
CALCIUM: 9.2 mg/dL (ref 8.9–10.3)
CO2: 23 mmol/L (ref 22–32)
Chloride: 110 mmol/L (ref 101–111)
Creatinine, Ser: 0.84 mg/dL (ref 0.44–1.00)
Glucose, Bld: 109 mg/dL — ABNORMAL HIGH (ref 65–99)
Potassium: 4 mmol/L (ref 3.5–5.1)
Sodium: 140 mmol/L (ref 135–145)

## 2016-07-07 LAB — CBC
HCT: 38.9 % (ref 36.0–46.0)
HEMOGLOBIN: 12.2 g/dL (ref 12.0–15.0)
MCH: 27.2 pg (ref 26.0–34.0)
MCHC: 31.4 g/dL (ref 30.0–36.0)
MCV: 86.8 fL (ref 78.0–100.0)
Platelets: 425 10*3/uL — ABNORMAL HIGH (ref 150–400)
RBC: 4.48 MIL/uL (ref 3.87–5.11)
RDW: 15.3 % (ref 11.5–15.5)
WBC: 12.3 10*3/uL — AB (ref 4.0–10.5)

## 2016-07-07 LAB — CBG MONITORING, ED: GLUCOSE-CAPILLARY: 104 mg/dL — AB (ref 65–99)

## 2016-07-07 MED ORDER — CYCLOBENZAPRINE HCL 10 MG PO TABS: 10.0000 mg | ORAL_TABLET | Freq: Two times a day (BID) | ORAL | 0 refills | Status: DC | PRN

## 2016-07-07 MED ORDER — IBUPROFEN 400 MG PO TABS
600.0000 mg | ORAL_TABLET | Freq: Once | ORAL | Status: AC
  Administered 2016-07-07: 600 mg via ORAL
  Filled 2016-07-07: qty 1

## 2016-07-07 MED ORDER — NAPROXEN 375 MG PO TABS: 375.0000 mg | ORAL_TABLET | Freq: Two times a day (BID) | ORAL | 0 refills | Status: DC

## 2016-07-07 NOTE — ED Triage Notes (Signed)
The pt is c/o chest pain with sob dizziness and nausea since last pm.  She has no sob at present

## 2016-07-07 NOTE — ED Notes (Signed)
Pt reports she checked her blood sugar while in the waiting room and it was 68 and she has ate four pieces of chocolate. Pt brought back to triage room to have blood sugar rechecked.

## 2016-07-07 NOTE — ED Notes (Signed)
Patient states she started with chest pain yesterday and it has continued since.  States she has never had this pain before  Denies cough

## 2016-07-07 NOTE — ED Provider Notes (Signed)
[facility] DEPT Provider Note   CSN: [id number] Arrival date & time: 07/07/16  1722     History   Chief Complaint Chief Complaint  Patient presents with  . Chest Pain    HPI Monique Nelson is a 50 y.o. female.  HPI Chest Pain   This is a new problem. The current episode started yesterday. The pain is present in the lateral region (left). The pain is moderate. The quality of the pain is described as pressure-like. The pain radiates to the left arm. Episode Length: constant. Exacerbated by: nothing in particular. Associated symptoms include lower extremity edema, nausea and shortness of breath. Pertinent negatives include no abdominal pain, no back pain, no fever and no vomiting. She has tried nothing for the symptoms. The treatment provided no relief.  Her past medical history is significant for diabetes.  Pertinent negatives for past medical history include no aortic dissection, no CAD, no hyperlipidemia, no hypertension, no MI and no PE.  Her family medical history is significant for heart disease.  Procedure history is negative for cardiac catheterization, stress echo and stress thallium Past Medical History:  Diagnosis Date  . Diabetes mellitus without complication ([place])   . Drug addiction ([place])    recovering  . Ex-smoker 2009  . History of fracture of wrist 17 years ago   has plate and pins right   . Hx of hysterectomy    bleeding and cysts  . Hx of varicella   . Substance abuse in remission 13 years ago    cocaine heroiin  met   support via ADP  . TIA (transient ischemic attack)   . Vertigo    intermittent infrequent    Patient Active Problem List   Diagnosis Date Noted  . Diabetes mellitus without complication ([place]) 01/77/9390  . Solar dermatitis ? 05/05/2014  . Allergic conjunctivitis and rhinitis 12/23/2013  . Hypokalemia 12/23/2013  . Prediabetes 12/23/2013  . Family history of sleep apnea 11/17/2013  . OSA (obstructive sleep apnea) 11/17/2013  .  Snoring 11/17/2013  . Hot flushes, perimenopausal 11/17/2013  . Sleep difficulties 08/05/2013  . Bacterial vaginosis 08/05/2013  . Fasting hyperglycemia 01/10/2013  . Diarrhea 01/10/2013  . Abdominal pain, epigastric 01/10/2013  . Nausea with vomiting 01/10/2013  . FH: Crohn's disease 01/10/2013  . Headache(784.0) 09/10/2012  . Elevated blood pressure reading 09/10/2012  . Menopausal hot flushes 08/02/2012  . Visit for preventive health examination 08/02/2012  . Hx of hysterectomy   . Ex-smoker   . Substance abuse in remission   . Morbid obesity ([place]) 12/10/2007  . ALLERGIC RHINITIS 12/10/2007  . VERTIGO 12/10/2007    Past Surgical History:  Procedure Laterality Date  . ABDOMINAL HYSTERECTOMY    . ablastion    . rt hand surgery     17 years ago    OB History    No data available       Home Medications    Prior to Admission medications   Medication Sig Start Date End Date Taking? Authorizing Provider  cetirizine (ZYRTEC) 10 MG tablet Take 10 mg by mouth daily as needed for allergies.    Yes Historical Provider, MD  dimenhyDRINATE (DRAMAMINE) 50 MG tablet Take 50 mg by mouth every 8 (eight) hours as needed for nausea.   Yes Historical Provider, MD  gabapentin (NEURONTIN) 100 MG capsule Take 1 capsule (100 mg total) by mouth at bedtime. Patient taking differently: Take 100 mg by mouth as needed (FOR PAIN).  02/06/16  Yes Jeannie Done  Pfeiffer, MD  glimepiride (AMARYL) 2 MG tablet Take 1 tablet (2 mg total) by mouth daily before breakfast. 04/21/16  Yes Renato Shin, MD  Tetrahydrozoline-Zn Sulfate (EYE DROPS ALLERGY RELIEF OP) Apply 2 drops to eye daily.   Yes Historical Provider, MD  VICTOZA 18 MG/3ML SOPN INJECT 1.'8MG'$  INTO THE SKIN EVERY DAY 06/01/16  Yes Elayne Snare, MD  cyclobenzaprine (FLEXERIL) 10 MG tablet Take 1 tablet (10 mg total) by mouth 2 (two) times daily as needed for muscle spasms. 07/07/16   Dorie Rank, MD  naproxen (NAPROSYN) 375 MG tablet Take 1 tablet (375 mg total)  by mouth 2 (two) times daily. 07/07/16   Dorie Rank, MD  sodium chloride (OCEAN) 0.65 % SOLN nasal spray Place 1 spray into both nostrils as needed for congestion. Patient not taking: Reported on 07/07/2016 02/06/16   Charlesetta Shanks, MD    Family History Family History  Problem Relation Age of Onset  . COPD Mother   . Diabetes Mother   . Hypertension Mother   . Sleep apnea Mother     and sister   . Thyroid disease Mother   . Chronic fatigue Mother   . Osteoarthritis Mother     knee replacement   . Glaucoma Father   . Other Father     prostate disease  . Alcohol abuse      drug parent    Social History Social History  Substance Use Topics  . Smoking status: Former Smoker    Packs/day: 0.04    Years: 8.00    Types: Cigarettes    Start date: 10/21/2013  . Smokeless tobacco: Never Used     Comment: off and on x 8 yrs  . Alcohol use No     Allergies   Other   Review of Systems Review of Systems  Musculoskeletal: Negative for joint swelling.  Skin: Negative for rash.  Neurological: Negative for weakness and numbness.  All other systems reviewed and are negative.    Physical Exam Updated Vital Signs BP (!) 113/54   Pulse 90   Temp 99 F (37.2 C)   Resp 20   Ht '5\' 1"'$  (1.549 m)   LMP 12/15/2011   SpO2 98%   Physical Exam  Constitutional: She appears well-developed and well-nourished. No distress.  HENT:  Head: Normocephalic and atraumatic.  Right Ear: External ear normal.  Left Ear: External ear normal.  Eyes: Conjunctivae are normal. Right eye exhibits no discharge. Left eye exhibits no discharge. No scleral icterus.  Neck: Neck supple. No tracheal deviation present.  Cardiovascular: Normal rate, regular rhythm and intact distal pulses.   Pulmonary/Chest: Effort normal and breath sounds normal. No stridor. No respiratory distress. She has no wheezes. She has no rales. She exhibits tenderness.  ttp left chest wall, pain with moving left arm,   Abdominal:  Soft. Bowel sounds are normal. She exhibits no distension. There is no tenderness. There is no rebound and no guarding.  Musculoskeletal: She exhibits no edema or tenderness.  No edema or erythema of extremities  Neurological: She is alert. She has normal strength. No cranial nerve deficit (no facial droop, extraocular movements intact, no slurred speech) or sensory deficit. She exhibits normal muscle tone. She displays no seizure activity. Coordination normal.  Skin: Skin is warm and dry. No rash noted.  Psychiatric: She has a normal mood and affect.  Nursing note and vitals reviewed.    ED Treatments / Results  Labs (all labs ordered are listed, but only  abnormal results are displayed) Labs Reviewed  BASIC METABOLIC PANEL - Abnormal; Notable for the following:       Result Value   Glucose, Bld 109 (*)    All other components within normal limits  CBC - Abnormal; Notable for the following:    WBC 12.3 (*)    Platelets 425 (*)    All other components within normal limits  CBG MONITORING, ED - Abnormal; Notable for the following:    Glucose-Capillary 104 (*)    All other components within normal limits  TROPONIN I  TROPONIN I    EKG  EKG Interpretation  Date/Time:  Friday July 07 2016 17:32:37 EDT Ventricular Rate:  88 PR Interval:  144 QRS Duration: 80 QT Interval:  390 QTC Calculation: 471 R Axis:   10 Text Interpretation:  Normal sinus rhythm Cannot rule out Anterior infarct , age undetermined Abnormal ECG No significant change since last tracing Confirmed by Dionne Rossa  MD-J, Krrish Freund [phone]) on 07/07/2016 7:48:53 PM       Radiology Dg Chest 2 View  Result Date: 07/07/2016 CLINICAL DATA:  50 y/o F; left-sided chest pain and pain down the left arm with tingling. EXAM: CHEST  2 VIEW COMPARISON:  10/29/2013 chest radiograph. FINDINGS: Low lung volumes accentuate pulmonary markings. Stable cardiomediastinal silhouette given projection and technique. Linear opacities at left  lung base probably represent atelectasis. No pneumothorax. No pleural effusion. No acute osseous abnormality is evident. IMPRESSION: Low lung volumes.  No active cardiopulmonary disease. Electronically Signed   By: Kristine Garbe M.D.   On: 07/07/2016 18:26    Procedures Procedures (including critical care time)  Medications Ordered in ED Medications  ibuprofen (ADVIL,MOTRIN) tablet 600 mg (600 mg Oral Given 07/07/16 2048)     Initial Impression / Assessment and Plan / ED Course  I have reviewed the triage vital signs and the nursing notes.  Pertinent labs & imaging results that were available during my care of the patient were reviewed by me and considered in my medical decision making (see chart for details).  Clinical Course   Sx atypical for cardiac disease.  Heart score is 2.  Doubt PE, ACS.  2 sets of enzymes negative.  Suspect musculoskeletal pain.  Will dc home with nsaids and flexeril  Final Clinical Impressions(s) / ED Diagnoses   Final diagnoses:  Chest wall pain    New Prescriptions New Prescriptions   CYCLOBENZAPRINE (FLEXERIL) 10 MG TABLET    Take 1 tablet (10 mg total) by mouth 2 (two) times daily as needed for muscle spasms.   NAPROXEN (NAPROSYN) 375 MG TABLET    Take 1 tablet (375 mg total) by mouth 2 (two) times daily.     Dorie Rank, MD 07/07/16 2238

## 2016-07-11 ENCOUNTER — Encounter: Payer: Self-pay | Admitting: Neurology

## 2016-07-11 ENCOUNTER — Other Ambulatory Visit (INDEPENDENT_AMBULATORY_CARE_PROVIDER_SITE_OTHER): Payer: 59

## 2016-07-11 ENCOUNTER — Ambulatory Visit (INDEPENDENT_AMBULATORY_CARE_PROVIDER_SITE_OTHER): Payer: 59 | Admitting: Neurology

## 2016-07-11 VITALS — BP 130/80 | HR 83 | Ht 61.0 in | Wt 265.3 lb

## 2016-07-11 DIAGNOSIS — Z794 Long term (current) use of insulin: Secondary | ICD-10-CM

## 2016-07-11 DIAGNOSIS — E119 Type 2 diabetes mellitus without complications: Secondary | ICD-10-CM | POA: Diagnosis not present

## 2016-07-11 DIAGNOSIS — G450 Vertebro-basilar artery syndrome: Secondary | ICD-10-CM | POA: Diagnosis not present

## 2016-07-11 LAB — HEPATIC FUNCTION PANEL
ALBUMIN: 3.6 g/dL (ref 3.5–5.2)
ALT: 11 U/L (ref 0–35)
AST: 11 U/L (ref 0–37)
Alkaline Phosphatase: 70 U/L (ref 39–117)
Bilirubin, Direct: 0 mg/dL (ref 0.0–0.3)
TOTAL PROTEIN: 7.6 g/dL (ref 6.0–8.3)
Total Bilirubin: 0.2 mg/dL (ref 0.2–1.2)

## 2016-07-11 LAB — LIPID PANEL
CHOL/HDL RATIO: 4
Cholesterol: 164 mg/dL (ref 0–200)
HDL: 36.5 mg/dL — AB (ref >39.00)
LDL CALC: 110 mg/dL — AB (ref 0–99)
NONHDL: 127.23
TRIGLYCERIDES: 88 mg/dL (ref 0.0–149.0)
VLDL: 17.6 mg/dL (ref 0.0–40.0)

## 2016-07-11 LAB — MICROALBUMIN / CREATININE URINE RATIO
Creatinine,U: 203.1 mg/dL
Microalb Creat Ratio: 0.8 mg/g (ref 0.0–30.0)
Microalb, Ur: 1.7 mg/dL (ref 0.0–1.9)

## 2016-07-11 MED ORDER — ATORVASTATIN CALCIUM 20 MG PO TABS: 20.0000 mg | ORAL_TABLET | Freq: Every day | ORAL | 3 refills | Status: DC

## 2016-07-11 NOTE — Progress Notes (Signed)
NEUROLOGY FOLLOW UP OFFICE NOTE  MAELANI YARBRO [id number]  HISTORY OF PRESENT ILLNESS: Miray Mancino is a 50 year old right-handed woman with diabetes, OSA and history of substance abuse who follows up for syncope and stroke-like event, possibly basilar artery distribution TIA.  UPDATE: CTA of head and neck from 04/03/16 were personally reviewed and were normal, revealing no evidence of vertebrobasilar stenosis or occlusion.  TTE from 04/11/16 showed LVEG 60-65% with no cardiac source of emboli.  Hgb A1c from August was 6.3.  Lipid panel from this month showed total cholesterol 178, TG 93, HDL 36.30 and LDL 123.   HISTORY: On the evening of Friday, 02/06/16, she developed a holocephalic and posterior throbbing headache.  She took pain reliever and went to sleep.  However, the headache persisted the next day.  On 02/06/16, she was in church.  The headache increased.  She reportedly had a "strange" expression and then she passed out.  When she woke up on the floor, everything was black and she was unable to see.  It took 15 to 20 minutes before she was able to see anything.  There were people around her.  She also was numb and weak in the left arm and leg.  She has history of vertigo but denied associated dizziness or spinning with this event.  On presentation in the ED, she was crying.  Blood pressure in ED was normal.  She exhibited inconsistencies on neurologic exam, in regards to weakness.  CT of head and MRI of brain were personally reviewed and were both normal.  CBC and CMP were unremarkable.  Urine drug screen was negative.  EKG was normal.   She continues to have holocephalic squeezing headache about 2 to 3 times a week.  She takes Goody powder, which helps.   She reports no history or family history of headache.   LDL from 07/02/15 was 109.  Hgb A1c was 6.2.   Current medication includes gabapentin '100mg'$  at bedtime  PAST MEDICAL HISTORY: Past Medical History:  Diagnosis Date  .  Diabetes mellitus without complication ([place])   . Drug addiction ([place])    recovering  . Ex-smoker 2009  . History of fracture of wrist 17 years ago   has plate and pins right   . Hx of hysterectomy    bleeding and cysts  . Hx of varicella   . Substance abuse in remission 13 years ago    cocaine heroiin  met   support via ADP  . TIA (transient ischemic attack)   . Vertigo    intermittent infrequent    MEDICATIONS: Current Outpatient Prescriptions on File Prior to Visit  Medication Sig Dispense Refill  . cetirizine (ZYRTEC) 10 MG tablet Take 10 mg by mouth daily as needed for allergies.     . cyclobenzaprine (FLEXERIL) 10 MG tablet Take 1 tablet (10 mg total) by mouth 2 (two) times daily as needed for muscle spasms. 20 tablet 0  . dimenhyDRINATE (DRAMAMINE) 50 MG tablet Take 50 mg by mouth every 8 (eight) hours as needed for nausea.    Marland Kitchen gabapentin (NEURONTIN) 100 MG capsule Take 1 capsule (100 mg total) by mouth at bedtime. (Patient taking differently: Take 100 mg by mouth as needed (FOR PAIN). ) 30 capsule 0  . glimepiride (AMARYL) 2 MG tablet Take 1 tablet (2 mg total) by mouth daily before breakfast. 30 tablet 3  . naproxen (NAPROSYN) 375 MG tablet Take 1 tablet (375 mg total) by mouth 2 (  two) times daily. 20 tablet 0  . sodium chloride (OCEAN) 0.65 % SOLN nasal spray Place 1 spray into both nostrils as needed for congestion. 1 Bottle 0  . Tetrahydrozoline-Zn Sulfate (EYE DROPS ALLERGY RELIEF OP) Apply 2 drops to eye daily.    Marland Kitchen VICTOZA 18 MG/3ML SOPN INJECT 1.'8MG'$  INTO THE SKIN EVERY DAY 6 mL 0   No current facility-administered medications on file prior to visit.     ALLERGIES: Allergies  Allergen Reactions  . Other Other (See Comments)    ALL NARCOTICS-PATIENT PREFERENCE, PAST HISTORY    FAMILY HISTORY: Family History  Problem Relation Age of Onset  . COPD Mother   . Diabetes Mother   . Hypertension Mother   . Sleep apnea Mother     and sister   . Thyroid disease  Mother   . Chronic fatigue Mother   . Osteoarthritis Mother     knee replacement   . Glaucoma Father   . Other Father     prostate disease  . Alcohol abuse      drug parent    SOCIAL HISTORY: Social History   Social History  . Marital status: Single    Spouse name: N/A  . Number of children: 1  . Years of education: N/A   Occupational History  .  [employer]   Social History Main Topics  . Smoking status: Former Smoker    Packs/day: 0.04    Years: 8.00    Types: Cigarettes    Start date: 10/21/2013  . Smokeless tobacco: Never Used     Comment: off and on x 8 yrs  . Alcohol use No  . Drug use:     Types: "Crack" cocaine, Heroin, Methamphetamines, Marijuana     Comment: History- 14 years clean-11/21/13  . Sexual activity: Not on file   Other Topics Concern  . 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   schooll major crm justice and marketing   Single neg tad except Garment/textile technologist education   Neg firearms    [place] of 1   Sleep about 5 hours    G2P1   Sees [facility]    REVIEW OF SYSTEMS: Constitutional: No fevers, chills, or sweats, no generalized fatigue, change in appetite Eyes: No visual changes, double vision, eye pain Ear, nose and throat: No hearing loss, ear pain, nasal congestion, sore throat Cardiovascular: No chest pain, palpitations Respiratory:  No shortness of breath at rest or with exertion, wheezes GastrointestinaI: No nausea, vomiting, diarrhea, abdominal pain, fecal incontinence Genitourinary:  No dysuria, urinary retention or frequency Musculoskeletal:  No neck pain, back pain Integumentary: No rash, pruritus, skin lesions Neurological: as above Psychiatric: No depression, insomnia, anxiety Endocrine: No palpitations, fatigue, diaphoresis, mood swings, change in appetite, change in weight, increased thirst Hematologic/Lymphatic:  No purpura, petechiae. Allergic/Immunologic: no  itchy/runny eyes, nasal congestion, recent allergic reactions, rashes  PHYSICAL EXAM: Vitals:   07/11/16 0840  BP: 130/80  Pulse: 83   General: No acute distress.  Patient appears well-groomed.  Morbidly obese body habitus. Head:  Normocephalic/atraumatic Eyes:  Fundi examined but not visualized Neck: supple, no paraspinal tenderness, full range of motion Heart:  Regular rate and rhythm Lungs:  Clear to auscultation bilaterally Back: No paraspinal tenderness Neurological Exam: alert and oriented to person, place, and time. Attention span and concentration intact, recent and remote memory intact, fund of  knowledge intact.  Speech fluent and not dysarthric, language intact.  CN II-XII intact. Bulk and tone normal, muscle strength 5/5 throughout.  Sensation to light touch  intact.  Deep tendon reflexes 2+ throughout.  Finger to nose testing intact.  Gait normal, Romberg negative.  IMPRESSION: Vertebrobasilar TIA Type 2 diabetes Morbid obesity  PLAN: 1.  Continue ASA '81mg'$  daily 2.  LDL goal should be less than 70.  Will start atorvastatin '20mg'$  daily and recheck fasting lipid panel and LFTs in 3 months. 3.  Continue glycemic control 4.  Weight loss 5.  Mediterranean diet 6.  Exercise 7.  Follow up in 6 months.  25 minutes spent face to face with patient, over 50% spent counseling.  Metta Clines, DO  CC:  Shanon Ace, MD

## 2016-07-11 NOTE — Patient Instructions (Signed)
1.  Continue aspirin 81mg  daily 2.  We will start atorvastatin 20mg  daily to decrease cholesterol.  Will repeat fasting lipid panel and LFTs in 3 months 3.  Diabetes control 4.  Exercise and weight loss 5.  Mediterranean diet    Why follow it? Research shows. . Those who follow the Mediterranean diet have a reduced risk of heart disease  . The diet is associated with a reduced incidence of Parkinson's and Alzheimer's diseases . People following the diet may have longer life expectancies and lower rates of chronic diseases  . The Dietary Guidelines for Americans recommends the Mediterranean diet as an eating plan to promote health and prevent disease  What Is the Mediterranean Diet?  . Healthy eating plan based on typical foods and recipes of Mediterranean-style cooking . The diet is primarily a plant based diet; these foods should make up a majority of meals   Starches - Plant based foods should make up a majority of meals - They are an important sources of vitamins, minerals, energy, antioxidants, and fiber - Choose whole grains, foods high in fiber and minimally processed items  - Typical grain sources include wheat, oats, barley, corn, brown rice, bulgar, farro, millet, polenta, couscous  - Various types of beans include chickpeas, lentils, fava beans, black beans, white beans   Fruits  Veggies - Large quantities of antioxidant rich fruits & veggies; 6 or more servings  - Vegetables can be eaten raw or lightly drizzled with oil and cooked  - Vegetables common to the traditional Mediterranean Diet include: artichokes, arugula, beets, broccoli, brussel sprouts, cabbage, carrots, celery, collard greens, cucumbers, eggplant, kale, leeks, lemons, lettuce, mushrooms, okra, onions, peas, peppers, potatoes, pumpkin, radishes, rutabaga, shallots, spinach, sweet potatoes, turnips, zucchini - Fruits common to the Mediterranean Diet include: apples, apricots, avocados, cherries, clementines, dates,  figs, grapefruits, grapes, melons, nectarines, oranges, peaches, pears, pomegranates, strawberries, tangerines  Fats - Replace butter and margarine with healthy oils, such as olive oil, canola oil, and tahini  - Limit nuts to no more than a handful a day  - Nuts include walnuts, almonds, pecans, pistachios, pine nuts  - Limit or avoid candied, honey roasted or heavily salted nuts - Olives are central to the [employer] - can be eaten whole or used in a variety of dishes   Meats Protein - Limiting red meat: no more than a few times a month - When eating red meat: choose lean cuts and keep the portion to the size of deck of cards - Eggs: approx. 0 to 4 times a week  - Fish and lean poultry: at least 2 a week  - Healthy protein sources include, chicken, [place], lean beef, lamb - Increase intake of seafood such as tuna, salmon, trout, mackerel, shrimp, scallops - Avoid or limit high fat processed meats such as sausage and bacon  Dairy - Include moderate amounts of low fat dairy products  - Focus on healthy dairy such as fat free yogurt, skim milk, low or reduced fat cheese - Limit dairy products higher in fat such as whole or 2% milk, cheese, ice cream  Alcohol - Moderate amounts of red wine is ok  - No more than 5 oz daily for women (all ages) and men older than age 65  - No more than 10 oz of wine daily for men younger than 47  Other - Limit sweets and other desserts  - Use herbs and spices instead of salt to flavor foods  - Herbs  and spices common to the traditional Mediterranean Diet include: basil, bay leaves, chives, cloves, cumin, fennel, garlic, lavender, marjoram, mint, oregano, parsley, pepper, rosemary, sage, savory, sumac, tarragon, thyme   It's not just a diet, it's a lifestyle:  . The Mediterranean diet includes lifestyle factors typical of those in the region  . Foods, drinks and meals are best eaten with others and savored . Daily physical activity is important for  overall good health . This could be strenuous exercise like running and aerobics . This could also be more leisurely activities such as walking, housework, yard-work, or taking the stairs . Moderation is the key; a balanced and healthy diet accommodates most foods and drinks . Consider portion sizes and frequency of consumption of certain foods   Meal Ideas & Options:  . Breakfast:  o Whole wheat toast or whole wheat English muffins with peanut butter & hard boiled egg o Steel cut oats topped with apples & cinnamon and skim milk  o Fresh fruit: banana, strawberries, melon, berries, peaches  o Smoothies: strawberries, bananas, greek yogurt, peanut butter o Low fat greek yogurt with blueberries and granola  o Egg white omelet with spinach and mushrooms o Breakfast couscous: whole wheat couscous, apricots, skim milk, cranberries  . Sandwiches:  o Hummus and grilled vegetables (peppers, zucchini, squash) on whole wheat bread   o Grilled chicken on whole wheat pita with lettuce, tomatoes, cucumbers or tzatziki  o Tuna salad on whole wheat bread: tuna salad made with greek yogurt, olives, red peppers, capers, green onions o Garlic rosemary lamb pita: lamb sauted with garlic, rosemary, salt & pepper; add lettuce, cucumber, greek yogurt to pita - flavor with lemon juice and black pepper  . Seafood:  o Mediterranean grilled salmon, seasoned with garlic, basil, parsley, lemon juice and black pepper o Shrimp, lemon, and spinach whole-grain pasta salad made with low fat greek yogurt  o Seared scallops with lemon orzo  o Seared tuna steaks seasoned salt, pepper, coriander topped with tomato mixture of olives, tomatoes, olive oil, minced garlic, parsley, green onions and cappers  . Meats:  o Herbed greek chicken salad with kalamata olives, cucumber, feta  o Red bell peppers stuffed with spinach, bulgur, lean ground beef (or lentils) & topped with feta   o Kebabs: skewers of chicken, tomatoes, onions,  zucchini, squash  o [place]urkey burgers: made with red onions, mint, dill, lemon juice, feta cheese topped with roasted red peppers . Vegetarian o Cucumber salad: cucumbers, artichoke hearts, celery, red onion, feta cheese, tossed in olive oil & lemon juice  o Hummus and whole grain pita points with a greek salad (lettuce, tomato, feta, olives, cucumbers, red onion) o Lentil soup with celery, carrots made with vegetable broth, garlic, salt and pepper  o Tabouli salad: parsley, bulgur, mint, scallions, cucumbers, tomato, radishes, lemon juice, olive oil, salt and pepper. FOLLOW UP IN 6 MONTHS

## 2016-07-18 ENCOUNTER — Ambulatory Visit (INDEPENDENT_AMBULATORY_CARE_PROVIDER_SITE_OTHER): Payer: 59 | Admitting: Pulmonary Disease

## 2016-07-18 ENCOUNTER — Encounter: Payer: Self-pay | Admitting: Pulmonary Disease

## 2016-07-18 VITALS — BP 100/86 | HR 87 | Ht 61.0 in | Wt 266.0 lb

## 2016-07-18 DIAGNOSIS — G4733 Obstructive sleep apnea (adult) (pediatric): Secondary | ICD-10-CM

## 2016-07-18 DIAGNOSIS — J301 Allergic rhinitis due to pollen: Secondary | ICD-10-CM

## 2016-07-18 NOTE — Progress Notes (Signed)
   Subjective:    Patient ID: Monique DittoLetitia A Nelson, female    DOB: 1966-01-02, 50 y.o.   MRN: [id number]  HPI  50 year old obese woman for FU of obstructive sleep apnea and insomnia.  She also reports difficulty maintaining sleep in spite of several sleep aids such as melatonin, sominex and Benadryl.  She quit using drugs 14 years ago and quit smoking in 10/2013 after 30-pack-years.  07/18/2016  Chief Complaint  Patient presents with  . Follow-up    doing well on CPAP.  Has Paykel machine, need to get download from [facility].      She was finally able to obtain Fisher-Paykel CPAP from her sister   CPAP was reset to 9 cm and she was provided with supplies. She has been able to use this with some improvement in her daytime somnolence and fatigue. She did not like the high pressure no dryness or mask issues  Her weight is meanwhile unchanged  Significant tests/ events  Home study 12/2013 showed severe OSA - AHI 56/h 01/2014 Titration >CPAP  9 cm with a small fullface mask    Review of Systems neg for any significant sore throat, dysphagia, itching, sneezing, nasal congestion or excess/ purulent secretions, fever, chills, sweats, unintended wt loss, pleuritic or exertional cp, hempoptysis, orthopnea pnd or change in chronic leg swelling. Also denies presyncope, palpitations, heartburn, abdominal pain, nausea, vomiting, diarrhea or change in bowel or urinary habits, dysuria,hematuria, rash, arthralgias, visual complaints, headache, numbness weakness or ataxia.     Objective:   Physical Exam  Gen. Pleasant, obese, in no distress ENT - no lesions, no post nasal drip Neck: No JVD, no thyromegaly, no carotid bruits Lungs: no use of accessory muscles, no dullness to percussion, decreased without rales or rhonchi  Cardiovascular: Rhythm regular, heart sounds  normal, no murmurs or gallops, no peripheral edema Musculoskeletal: No deformities, no cyanosis or clubbing , no tremors         Assessment & Plan:

## 2016-07-18 NOTE — Patient Instructions (Signed)
We will review download report on your CPAP and suggest changes if needed

## 2016-07-18 NOTE — Assessment & Plan Note (Signed)
We will reviewed download from DME and adjust CPAP settings as needed  Weight loss encouraged, compliance with goal of at least 4-6 hrs every night is the expectation. Advised against medications with sedative side effects Cautioned against driving when sleepy - understanding that sleepiness will vary on a day to day basis

## 2016-07-18 NOTE — Assessment & Plan Note (Signed)
Continue Zyrtec.

## 2016-07-20 ENCOUNTER — Telehealth: Payer: Self-pay | Admitting: Pulmonary Disease

## 2016-07-20 ENCOUNTER — Encounter: Payer: Self-pay | Admitting: Gastroenterology

## 2016-07-20 MED ORDER — AZITHROMYCIN 250 MG PO TABS: ORAL_TABLET | ORAL | 0 refills | Status: DC

## 2016-07-20 NOTE — Telephone Encounter (Signed)
Spoke with pt and advised of Dr Alva's recommendations. Rx sent to pharmacy. 

## 2016-07-20 NOTE — Telephone Encounter (Signed)
LMTC x `1 for pt 

## 2016-07-20 NOTE — Telephone Encounter (Signed)
Pt seen Dr Vassie LollAlva on 07/18/16 and was told if her mucus turns colors to call us back.  Pt now c/o sinus drainage (yellow-green) and prod cough (yellow- green), sorethroat.  Denies fever.  Please advise

## 2016-07-20 NOTE — Telephone Encounter (Signed)
Z pak

## 2016-07-24 ENCOUNTER — Telehealth: Payer: Self-pay | Admitting: Pulmonary Disease

## 2016-07-24 DIAGNOSIS — G4733 Obstructive sleep apnea (adult) (pediatric): Secondary | ICD-10-CM

## 2016-07-24 NOTE — Telephone Encounter (Signed)
Called spoke with pt. She is requesting a new order for a CPAP machine. She states that she has to use a loaner when we place an order download. I explained to her that  I would send a message to RA for approval. She voiced understanding and had no further questions.   RA please advise

## 2016-07-25 NOTE — Telephone Encounter (Signed)
OK for CPAP 9 cm

## 2016-07-26 NOTE — Telephone Encounter (Signed)
lmtcb x1 for pt. 

## 2016-07-27 NOTE — Telephone Encounter (Signed)
Called and spoke with pt and she is aware of order that has been placed with [facility].

## 2016-08-08 ENCOUNTER — Encounter: Payer: Self-pay | Admitting: Endocrinology

## 2016-08-08 ENCOUNTER — Ambulatory Visit (INDEPENDENT_AMBULATORY_CARE_PROVIDER_SITE_OTHER): Payer: 59 | Admitting: Endocrinology

## 2016-08-08 VITALS — BP 102/76 | HR 125 | Temp 98.0°F | Resp 16 | Ht 61.0 in | Wt 269.8 lb

## 2016-08-08 DIAGNOSIS — E119 Type 2 diabetes mellitus without complications: Secondary | ICD-10-CM

## 2016-08-08 MED ORDER — CANAGLIFLOZIN 100 MG PO TABS: ORAL_TABLET | ORAL | 3 refills | Status: DC

## 2016-08-08 NOTE — Patient Instructions (Signed)
Check blood sugars on waking up  2x weekly  Also check blood sugars about 2 hours after a meal and do this after different meals by rotation  Recommended blood sugar levels on waking up is 90-130 and about 2 hours after meal is 130-160  Please bring your blood sugar monitor to each visit, thank you  Invokana in am  Stop Glimeperide

## 2016-08-08 NOTE — Progress Notes (Signed)
Patient ID: Monique Nelson, female   DOB: August 13, 1966, 50 y.o.   MRN: [id number]    Reason for Appointment : Followup for Type 2 Diabetes  History of Present Illness          Diagnosis: Type 2 diabetes mellitus, date of diagnosis: 2015       Past history: She has had impaired fasting glucose as per her record since about 2009 with the highest level being 110 Has not had any nutritional counseling or any specific management of this previously On her routine exam her A1c was found to be 6.6 in 11/2013 and random glucose was 112  Recent history:   She was given Amaryl 2 mg by her PCP, could not tolerate metformin  Her last A1c was 6.3  Current blood sugar patterns and problems identified:  She has been exercising  since her last visit  Also she thinks she is trying to back on portions on her own and reviewing the diet that was recently given to her  Even though she has worked up the Victoza dose to 1.8 she does not think it is helping with weight loss or satiety.  She is concerned about lack of weight loss  Glucose readings at home are excellent although checking mostly midday and early evening  Blood sugar range 88-152 with median 118 only   Oral hypoglycemic drugs the patient is taking are: Amaryl 2 mg daily     Side effects from medications have been: GI side effects from metformin  Glucose monitoring with One Touch ultra:  as above  Glycemic control:   Lab Results  Component Value Date   HGBA1C 6.3 05/17/2016   HGBA1C 6.2 07/02/2015   HGBA1C 6.1 05/12/2015   Lab Results  Component Value Date   MICROALBUR 1.7 07/11/2016   LDLCALC 110 (H) 07/11/2016   CREATININE 0.84 07/07/2016    Self-care: The diet that the patient has been following is: tries to limit fat intake; for breakfast is having egg whites or [place] sausage Usually having a salad or meat and vegetables at lunch, snacks will be fruit occasionally  Meals: 3 meals per day.           Exercise:  Now going to the gym 4x per week     Dietician visit: Most recent: 8/16.               Compliance with the medical regimen: fair    Weight history:  Wt Readings from Last 3 Encounters:  08/08/16 269 lb 12.8 oz (122.4 kg)  07/18/16 266 lb (120.7 kg)  07/11/16 265 lb 5 oz (120.3 kg)      Medication List       Accurate as of 08/08/16  2:05 PM. Always use your most recent med list.          atorvastatin 20 MG tablet Commonly known as:  LIPITOR Take 1 tablet (20 mg total) by mouth daily.   cetirizine 10 MG tablet Commonly known as:  ZYRTEC Take 10 mg by mouth daily as needed for allergies.   cyclobenzaprine 10 MG tablet Commonly known as:  FLEXERIL Take 1 tablet (10 mg total) by mouth 2 (two) times daily as needed for muscle spasms.   dimenhyDRINATE 50 MG tablet Commonly known as:  DRAMAMINE Take 50 mg by mouth every 8 (eight) hours as needed for nausea.   EYE DROPS ALLERGY RELIEF OP Apply 2 drops to eye daily.   gabapentin 100 MG capsule Commonly  known as:  NEURONTIN Take 1 capsule (100 mg total) by mouth at bedtime.   glimepiride 2 MG tablet Commonly known as:  AMARYL Take 1 tablet (2 mg total) by mouth daily before breakfast.   naproxen 375 MG tablet Commonly known as:  NAPROSYN Take 1 tablet (375 mg total) by mouth 2 (two) times daily.   sodium chloride 0.65 % Soln nasal spray Commonly known as:  OCEAN Place 1 spray into both nostrils as needed for congestion.   VICTOZA 18 MG/3ML Sopn Generic drug:  liraglutide INJECT 1.'8MG'$  INTO THE SKIN EVERY DAY       Allergies:  Allergies  Allergen Reactions  . Other Other (See Comments)    ALL NARCOTICS-PATIENT PREFERENCE, PAST HISTORY    Past Medical History:  Diagnosis Date  . Diabetes mellitus without complication ([place])   . Drug addiction ([place])    recovering  . Ex-smoker 2009  . History of fracture of wrist 17 years ago   has plate and pins right   . Hx of hysterectomy    bleeding and cysts  . Hx  of varicella   . Substance abuse in remission 13 years ago    cocaine heroiin  met   support via ADP  . TIA (transient ischemic attack)   . Vertigo    intermittent infrequent    Past Surgical History:  Procedure Laterality Date  . ABDOMINAL HYSTERECTOMY    . ablastion    . rt hand surgery     17 years ago    Family History  Problem Relation Age of Onset  . COPD Mother   . Diabetes Mother   . Hypertension Mother   . Sleep apnea Mother     and sister   . Thyroid disease Mother   . Chronic fatigue Mother   . Osteoarthritis Mother     knee replacement   . Glaucoma Father   . Other Father     prostate disease  . Alcohol abuse      drug parent    Social History:  reports that she has quit smoking. Her smoking use included Cigarettes. She started smoking about 2 years ago. She has a 0.32 pack-year smoking history. She has never used smokeless tobacco. She reports that she uses drugs, including "Crack" cocaine, Heroin, Methamphetamines, and Marijuana. She reports that she does not drink alcohol.    Review of Systems       Lipids: As follows, currently not on any statin drugs   Lab Results  Component Value Date   CHOL 164 07/11/2016   HDL 36.50 (L) 07/11/2016   LDLCALC 110 (H) 07/11/2016   TRIG 88.0 07/11/2016   CHOLHDL 4 07/11/2016              She has known sleep apnea  Lab Results  Component Value Date   TSH 2.53 07/02/2015      Physical Examination:  BP 102/76   Pulse (!) 125   Temp 98 F (36.7 C)   Resp 16   Ht '5\' 1"'$  (1.549 m)   Wt 269 lb 12.8 oz (122.4 kg)   LMP 12/15/2011   SpO2 96%   BMI 50.98 kg/m     No ankle edema present  ASSESSMENT/PLAN:   Diabetes type 2, mild and well controlled with obesity  Although her A1c is fairly good at 6.3 she is Having issues with obesity  Since her last visit she has been much more motivated to try and watch her diet,  exercise regularly but has not had any improvement in her weight. Not benefiting as  expected from Victoza for weight loss or increase satiety and doubt if she will benefit from [place]  She should benefit from using Invokana to replace Amaryl as this would be better long-term for diabetes control and will promote weight loss Discussed action of SGLT 2 drugs on lowering glucose by decreasing kidney absorption of glucose, benefits of weight loss and lower blood pressure, possible side effects including candidiasis and dosage regimen  She will start with 100 mg daily in follow-up in 6 weeks   Phoebie Shad 08/08/2016, 2:05 PM

## 2016-08-22 ENCOUNTER — Other Ambulatory Visit: Payer: Self-pay

## 2016-08-22 ENCOUNTER — Telehealth: Payer: Self-pay

## 2016-08-22 DIAGNOSIS — Z1211 Encounter for screening for malignant neoplasm of colon: Secondary | ICD-10-CM

## 2016-08-22 NOTE — Telephone Encounter (Signed)
Per Dr Russella DarStark, pt ok for direct colon at hospital. [facility]  [name], Pt  is scheduled for her PV on 08/29/16. Her BMI is 51.1 Can you please schedule the pt at the hospital and notify pt of the appointments.Thanks

## 2016-08-22 NOTE — Telephone Encounter (Signed)
Patient has been rescheduled to 09/05/16 9:30. Patient notified of new date.  She will keep her appt for 08/29/16 for pre-visit

## 2016-08-22 NOTE — Telephone Encounter (Signed)
Dr Russella DarStark, This pt is scheduled for a colon on 09/12/16. Her BMI is 51.1. Does she need an office visit first or is a direct colon at the hospital OK. Please advise. Thanks

## 2016-08-22 NOTE — Telephone Encounter (Signed)
Ok for direct at hospital

## 2016-08-23 ENCOUNTER — Telehealth: Payer: Self-pay | Admitting: Gastroenterology

## 2016-08-23 NOTE — Telephone Encounter (Signed)
Patient has been rescheduled to 10/31/16 and pre-visit to 10/23/16

## 2016-09-12 ENCOUNTER — Encounter: Payer: 59 | Admitting: Gastroenterology

## 2016-09-15 ENCOUNTER — Other Ambulatory Visit: Payer: 59

## 2016-09-16 IMAGING — MR MR HEAD W/O CM
8 of 10 series · 39 of 48 positions shown · non-contrast
Comparison: Noncontrast head CT 7535 hours today.

CLINICAL DATA: 50-year-old female code stroke. Syncope and fall.
Subsequent left side weakness and ataxia. Initial encounter.

EXAM:
MRI HEAD WITHOUT CONTRAST
TECHNIQUE: Multiplanar, multiecho pulse sequences of the brain and surrounding
structures were obtained without intravenous contrast.

[Series 3: DWI · axial · 3.0mm · 1.09mm/px · z∈[-72,+75]mm · 11 of 100 slices shown (1 of 4)]
[im 1/100]
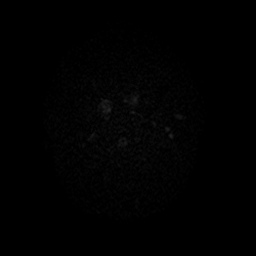
[im 10/100]
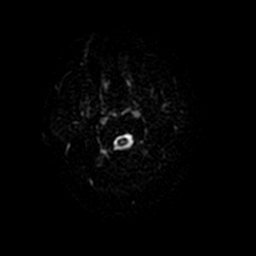
[im 20/100]
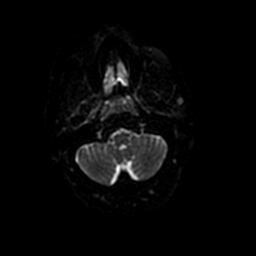
[im 30/100]
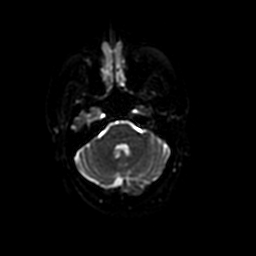
[im 40/100]
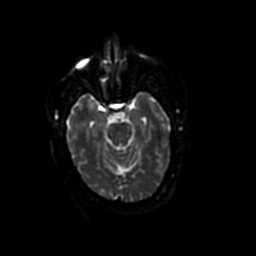
[im 50/100]
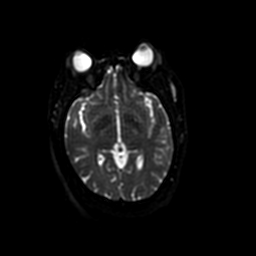
[im 60/100]
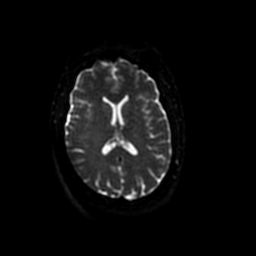
[im 70/100]
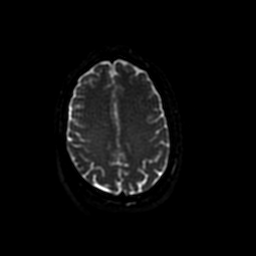
[im 80/100]
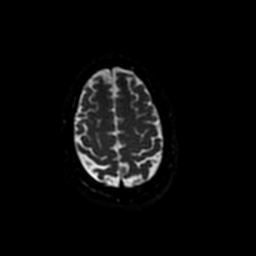
[im 90/100]
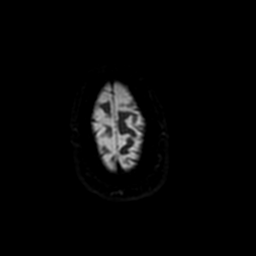
[im 100/100]
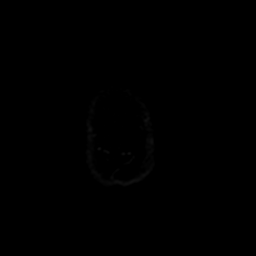

[Series 4: DWI · coronal · 5.0mm · 1.09mm/px · 8 of 72 slices shown (2 of 4)]
[im 1/72]
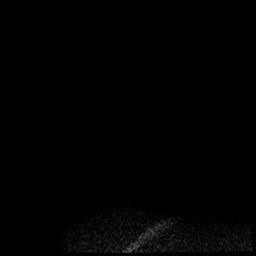
[im 9/72]
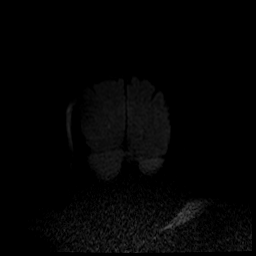
[im 18/72]
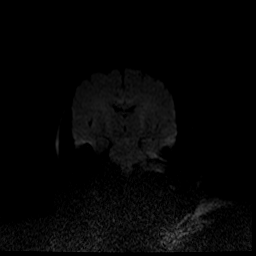
[im 27/72]
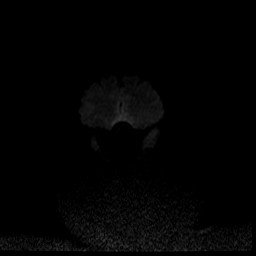
[im 45/72]
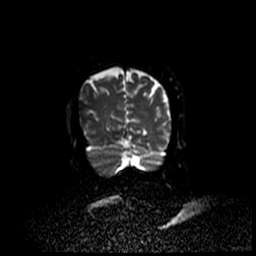
[im 54/72]
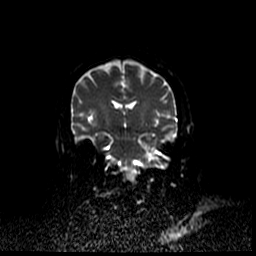
[im 63/72]
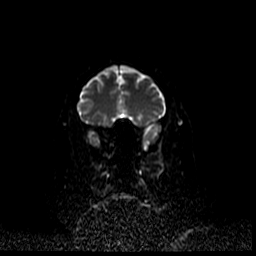
[im 72/72]
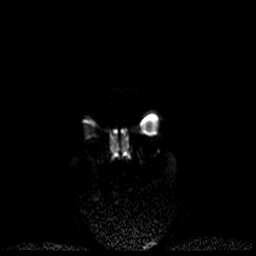

[Series 5: FLAIR · axial · 5.0mm · 0.43mm/px · z∈[-65,+67]mm · 3 of 23 slices shown (1 of 3)]
[im 1/23]
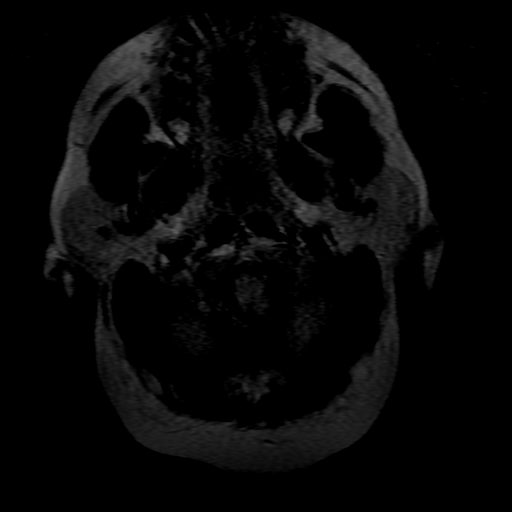
[im 12/23]
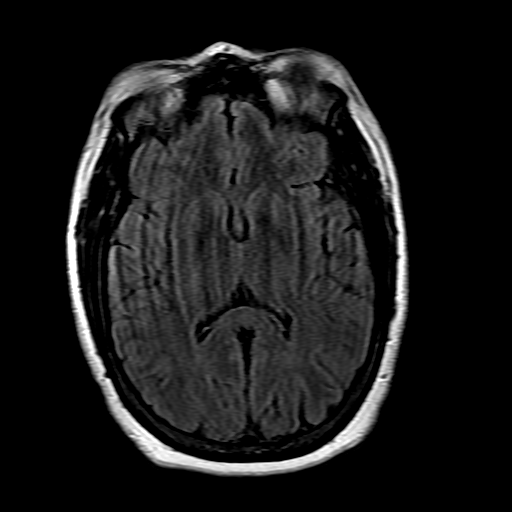
[im 23/23]
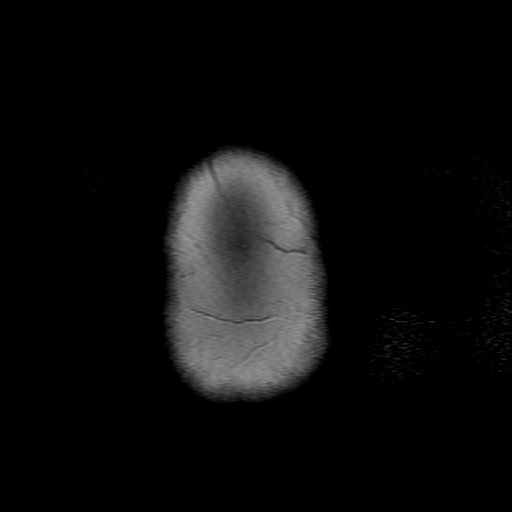

[Series 7: FLAIR · axial · 5.0mm · 0.43mm/px · z∈[-67,+65]mm · 3 of 23 slices shown (2 of 3)]
[im 1/23]
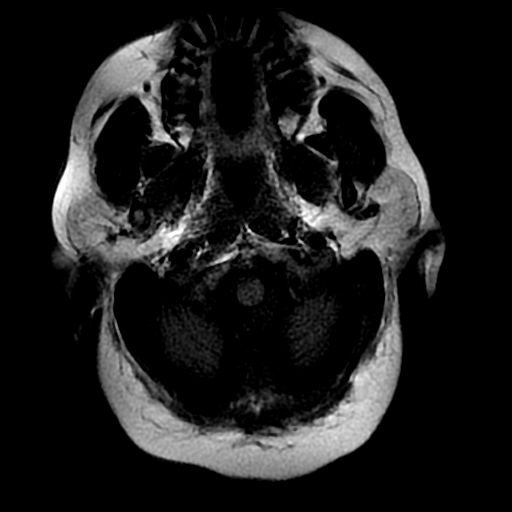
[im 12/23]
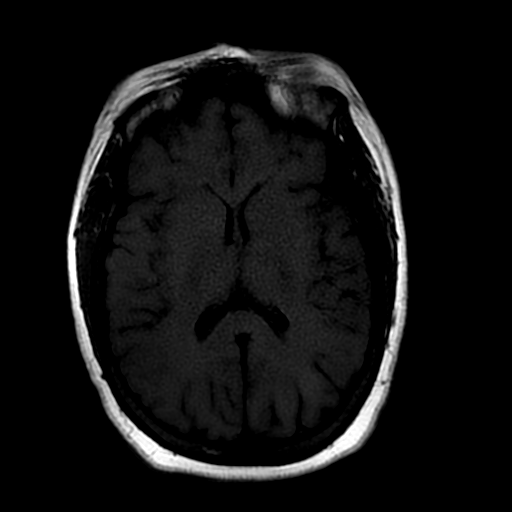
[im 23/23]
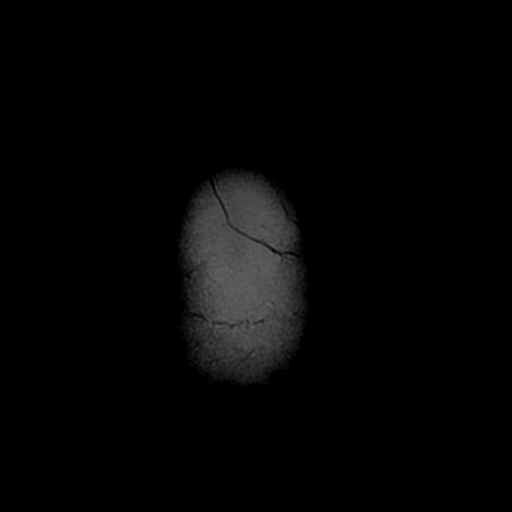

[Series 8: FLAIR · sagittal · 5.0mm · 0.47mm/px · 3 of 24 slices shown (3 of 3)]
[im 1/24]
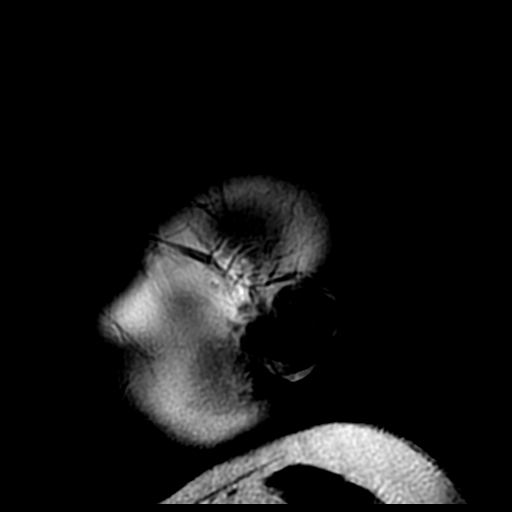
[im 12/24]
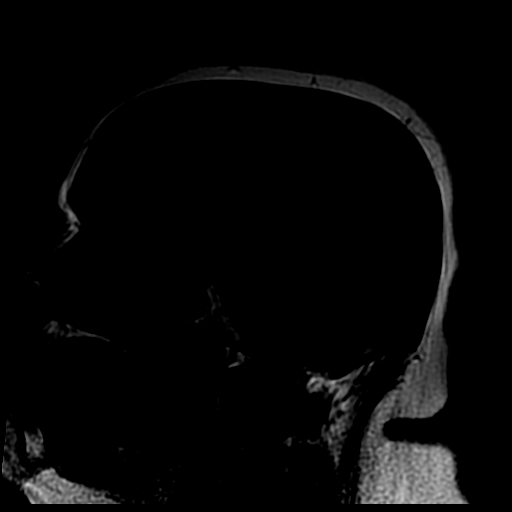
[im 24/24]
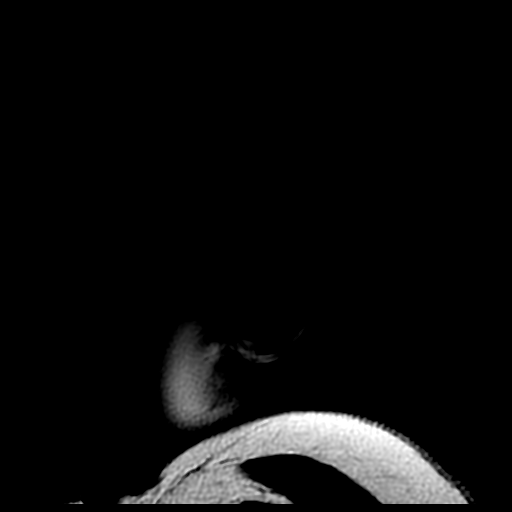

[Series 9: T2 · coronal · 5.0mm · 0.47mm/px · 1 of 28 slices shown]
[im 1/28]
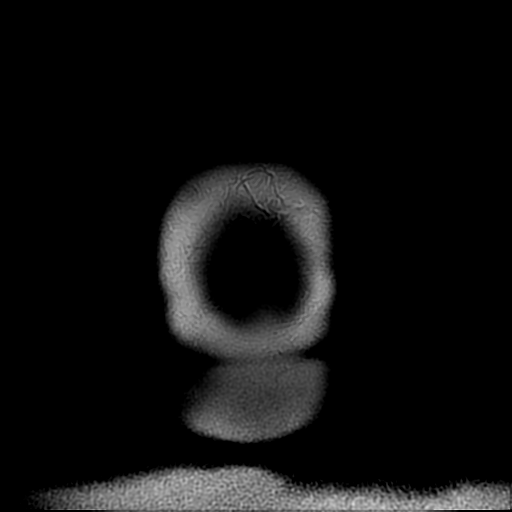

[Series 300: DWI · axial · 3.0mm · 1.09mm/px · z∈[-72,+75]mm · 6 of 50 slices shown (3 of 4)]
[im 1/50]
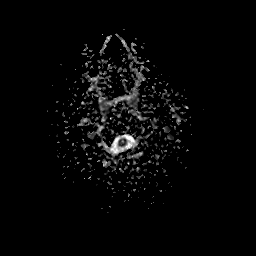
[im 10/50]
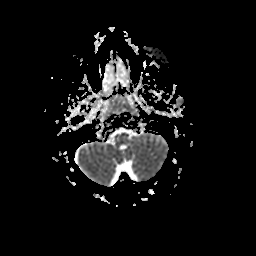
[im 20/50]
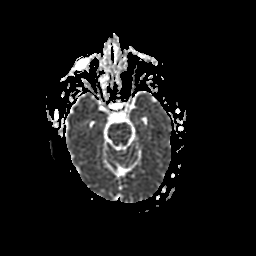
[im 30/50]
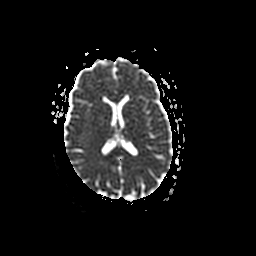
[im 40/50]
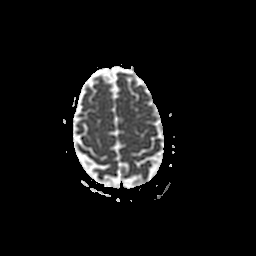
[im 50/50]
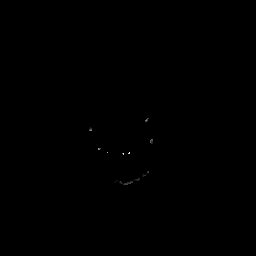

[Series 400: DWI · coronal · 5.0mm · 1.09mm/px · 4 of 36 slices shown (4 of 4)]
[im 1/36]
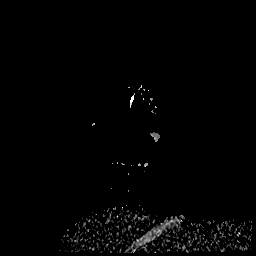
[im 12/36]
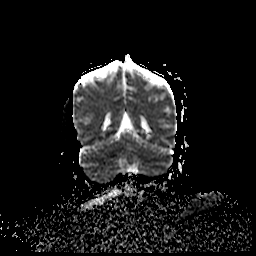
[im 24/36]
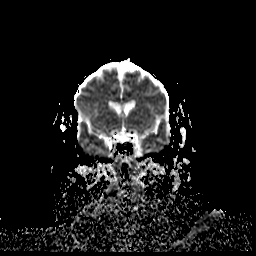
[im 36/36]
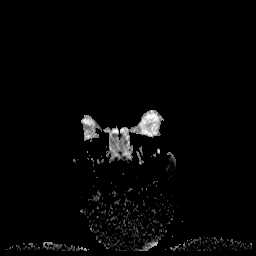

[39 of 48 positions shown; findings below may reference images not displayed]

FINDINGS: No restricted diffusion to suggest acute infarction. No midline
shift, mass effect, evidence of mass lesion, ventriculomegaly,
extra-axial collection or acute intracranial hemorrhage.
Cervicomedullary junction and pituitary are within normal limits.
Major intracranial vascular flow voids are preserved. Cerebral
volume is normal.

Intermittent mild motion artifact. Gray and white matter signal is
within normal limits throughout the brain. No encephalomalacia or
chronic cerebral blood products identified.

Grossly normal visualized internal auditory structures. Mastoids are
clear. Trace paranasal sinus fluid or mucosal thickening. Negative
orbit and scalp soft tissues. Visualized bone marrow signal is
within normal limits. Grossly negative visualized cervical spine.
IMPRESSION: No acute intracranial abnormality. Negative noncontrast MRI
appearance of the brain.

## 2016-09-19 ENCOUNTER — Ambulatory Visit: Payer: 59 | Admitting: Endocrinology

## 2016-09-25 ENCOUNTER — Other Ambulatory Visit: Payer: Self-pay

## 2016-09-25 MED ORDER — EXENATIDE ER 2 MG ~~LOC~~ PEN
PEN_INJECTOR | SUBCUTANEOUS | 3 refills | Status: DC
Start: 2016-09-25 — End: 2016-11-24

## 2016-10-20 ENCOUNTER — Encounter: Payer: Self-pay | Admitting: Gastroenterology

## 2016-10-20 ENCOUNTER — Telehealth: Payer: Self-pay

## 2016-10-20 ENCOUNTER — Other Ambulatory Visit: Payer: 59

## 2016-10-20 NOTE — Telephone Encounter (Signed)
Called patient let her know about pre-visit appt 1/8 and procedure on 1/16.

## 2016-10-23 ENCOUNTER — Telehealth: Payer: Self-pay | Admitting: Gastroenterology

## 2016-10-23 NOTE — Telephone Encounter (Signed)
Patient notified that would be best for her to find another pre-visit time.  She can come tomorrow at 8:30

## 2016-10-24 ENCOUNTER — Ambulatory Visit: Payer: 59 | Admitting: Endocrinology

## 2016-10-24 ENCOUNTER — Telehealth: Payer: Self-pay | Admitting: Gastroenterology

## 2016-10-24 NOTE — Telephone Encounter (Signed)
Patient's appt cancelled

## 2016-10-31 ENCOUNTER — Ambulatory Visit (HOSPITAL_COMMUNITY): Admission: RE | Admit: 2016-10-31 | Payer: 59 | Source: Ambulatory Visit | Admitting: Gastroenterology

## 2016-10-31 ENCOUNTER — Encounter (HOSPITAL_COMMUNITY): Admission: RE | Payer: Self-pay | Source: Ambulatory Visit

## 2016-10-31 SURGERY — COLONOSCOPY WITH PROPOFOL
Anesthesia: Monitor Anesthesia Care

## 2016-11-03 ENCOUNTER — Other Ambulatory Visit: Payer: 59

## 2016-11-07 ENCOUNTER — Ambulatory Visit: Payer: 59 | Admitting: Endocrinology

## 2016-11-07 ENCOUNTER — Other Ambulatory Visit: Payer: 59

## 2016-11-10 ENCOUNTER — Ambulatory Visit: Payer: 59 | Admitting: Endocrinology

## 2016-11-20 ENCOUNTER — Other Ambulatory Visit: Payer: 59

## 2016-11-21 ENCOUNTER — Other Ambulatory Visit (INDEPENDENT_AMBULATORY_CARE_PROVIDER_SITE_OTHER): Payer: 59

## 2016-11-21 DIAGNOSIS — E119 Type 2 diabetes mellitus without complications: Secondary | ICD-10-CM | POA: Diagnosis not present

## 2016-11-21 LAB — BASIC METABOLIC PANEL
BUN: 13 mg/dL (ref 6–23)
CHLORIDE: 108 meq/L (ref 96–112)
CO2: 28 mEq/L (ref 19–32)
CREATININE: 0.75 mg/dL (ref 0.40–1.20)
Calcium: 9.4 mg/dL (ref 8.4–10.5)
GFR: 104.79 mL/min (ref >60.00)
GLUCOSE: 90 mg/dL (ref 70–99)
POTASSIUM: 4.1 meq/L (ref 3.5–5.1)
Sodium: 140 mEq/L (ref 135–145)

## 2016-11-21 LAB — HEMOGLOBIN A1C: HEMOGLOBIN A1C: 6.8 % — AB (ref 4.6–6.5)

## 2016-11-24 ENCOUNTER — Encounter: Payer: Self-pay | Admitting: Endocrinology

## 2016-11-24 ENCOUNTER — Ambulatory Visit (INDEPENDENT_AMBULATORY_CARE_PROVIDER_SITE_OTHER): Payer: 59 | Admitting: Endocrinology

## 2016-11-24 VITALS — BP 100/64 | HR 92 | Ht 61.0 in | Wt 270.0 lb

## 2016-11-24 DIAGNOSIS — E1165 Type 2 diabetes mellitus with hyperglycemia: Secondary | ICD-10-CM | POA: Diagnosis not present

## 2016-11-24 MED ORDER — LIRAGLUTIDE 18 MG/3ML ~~LOC~~ SOPN: PEN_INJECTOR | SUBCUTANEOUS | 1 refills | Status: DC

## 2016-11-24 NOTE — Patient Instructions (Addendum)
Check blood sugars on waking up  weekly  Also check blood sugars about 2 hours after a meal and do this after different meals by rotation  Recommended blood sugar levels on waking up is 90-130 and about 2 hours after meal is 130-160  Please bring your blood sugar monitor to each visit, thank you  Exercise daily

## 2016-11-24 NOTE — Progress Notes (Signed)
Patient ID: Monique Nelson, female   DOB: 1965/12/17, 51 y.o.   MRN: [id number]    Reason for Appointment : Followup for Type 2 Diabetes  History of Present Illness          Diagnosis: Type 2 diabetes mellitus, date of diagnosis: 2015       Past history: She has had impaired fasting glucose as per her record since about 2009 with the highest level being 110 Has not had any nutritional counseling or any specific management of this previously On her routine exam her A1c was found to be 6.6 in 11/2013 and random glucose was 112  Recent history:   She was given Amaryl instead of Amaryl 2 mg to help with her weight and more consistent control  Her last A1c was 6.3 and now it is relatively higher at 6.8  Current blood sugar patterns and problems identified:  She has not been exercising  since her last visit, previously be going for days a week  She has tolerated Invokana well without side effects  Also continuing Victoza but she has not taken it for several days because of a higher co-pay, not clear if her insurance needs her to try Bydureon first  Blood sugar range 74-128 but checking mostly fasting, did not bring her monitor   Oral hypoglycemic drugs the patient is taking are: Amaryl 2 mg daily     Side effects from medications have been: GI side effects from metformin  Glucose monitoring with One Touch ultra:  as above  Glycemic control:   Lab Results  Component Value Date   HGBA1C 6.8 (H) 11/21/2016   HGBA1C 6.3 05/17/2016   HGBA1C 6.2 07/02/2015   Lab Results  Component Value Date   MICROALBUR 1.7 07/11/2016   LDLCALC 110 (H) 07/11/2016   CREATININE 0.75 11/21/2016    Self-care: The diet that the patient has been following is: tries to limit fat intake; for breakfast is having egg whites or [place] sausage Usually having a salad or meat and vegetables at lunch, snacks will be fruit occasionally  Meals: 3 meals per day.           Exercise: Now going to  the gym 0-1x per week     Dietician visit: Most recent: 8/16.               Compliance with the medical regimen: fair    Weight history:  Wt Readings from Last 3 Encounters:  11/24/16 270 lb (122.5 kg)  08/08/16 269 lb 12.8 oz (122.4 kg)  07/18/16 266 lb (120.7 kg)    Allergies as of 11/24/2016      Reactions   Other Other (See Comments)   ALL NARCOTICS-PATIENT PREFERENCE, PAST HISTORY      Medication List       Accurate as of 11/24/16  2:36 PM. Always use your most recent med list.          atorvastatin 20 MG tablet Commonly known as:  LIPITOR Take 1 tablet (20 mg total) by mouth daily.   canagliflozin 100 MG Tabs tablet Commonly known as:  INVOKANA 1 tablet before breakfast   cetirizine 10 MG tablet Commonly known as:  ZYRTEC Take 10 mg by mouth daily as needed for allergies.   cyclobenzaprine 10 MG tablet Commonly known as:  FLEXERIL Take 1 tablet (10 mg total) by mouth 2 (two) times daily as needed for muscle spasms.   dimenhyDRINATE 50 MG tablet Commonly known as:  DRAMAMINE Take 50 mg by mouth every 8 (eight) hours as needed for nausea.   Exenatide ER 2 MG Pen Commonly known as:  BYDUREON Inject 85m into skin once weekly   EYE DROPS ALLERGY RELIEF OP Apply 2 drops to eye daily.   gabapentin 100 MG capsule Commonly known as:  NEURONTIN Take 1 capsule (100 mg total) by mouth at bedtime.   glimepiride 2 MG tablet Commonly known as:  AMARYL Take 1 tablet (2 mg total) by mouth daily before breakfast.   naproxen 375 MG tablet Commonly known as:  NAPROSYN Take 1 tablet (375 mg total) by mouth 2 (two) times daily.   sodium chloride 0.65 % Soln nasal spray Commonly known as:  OCEAN Place 1 spray into both nostrils as needed for congestion.   VICTOZA 18 MG/3ML Sopn Generic drug:  liraglutide INJECT 1.8MG INTO THE SKIN EVERY DAY       Allergies:  Allergies  Allergen Reactions  . Other Other (See Comments)    ALL NARCOTICS-PATIENT PREFERENCE,  PAST HISTORY    Past Medical History:  Diagnosis Date  . [place]   . [place]    [place]  . Ex-smoker 2009  . History of fracture of wrist 17 years ago   has plate and pins right   . Hx of hysterectomy    bleeding and cysts  . Hx of varicella   . Substance abuse in remission 13 years ago    cocaine heroiin  met   support via ADP  . TIA (transient ischemic attack)   . Vertigo    intermittent infrequent    Past Surgical History:  Procedure Laterality Date  . ABDOMINAL HYSTERECTOMY    . ablastion    . rt hand surgery     17 years ago    Family History  Problem Relation Age of Onset  . COPD Mother   . Diabetes Mother   . Hypertension Mother   . Sleep apnea Mother     and sister   . Thyroid disease Mother   . Chronic fatigue Mother   . Osteoarthritis Mother     knee replacement   . Glaucoma Father   . Other Father     prostate disease  . Alcohol abuse      drug parent    Social History:  reports that she has quit smoking. Her smoking use included Cigarettes. She started smoking about 3 years ago. She has a 0.32 pack-year smoking history. She has never used smokeless tobacco. She reports that she uses drugs, including "Crack" cocaine, Heroin, Methamphetamines, and Marijuana. She reports that she does not drink alcohol.    Review of Systems       Lipids: As follows, currently On Lipitor 20 mg that was started in 2017    Lab Results  Component Value Date   CHOL 164 07/11/2016   HDL 36.50 (L) 07/11/2016   LDLCALC 110 (H) 07/11/2016   TRIG 88.0 07/11/2016   CHOLHDL 4 07/11/2016               Less tingling In her feet now    Physical Examination:  BP 100/64   Pulse 92   Ht 5' 1" (1.549 m)   Wt 270 lb (122.5 kg)   LMP 12/15/2011   SpO2 96%   BMI 51.02 kg/m    ASSESSMENT/PLAN:   Diabetes type 2, mild and well controlled with obesity  See history of present illness for  detailed discussion of  current diabetes management, blood sugar patterns and problems identified Her A1c has gone up to 6.8 despite using Invokana instead of Amaryl  This is likely to be from her been inconsistent with diet and not exercising as she was Weight is about the same Also she has not done any readings after meals as discussed previously  Since she should benefit long-term from [place] will continue this for now Discussed needing to check readings after meals, resuming regular exercise and needing follow-up in 2 months again  LIPIDS: We will check her labs on the next visit to see the effect of Lipitor   Jenney Brester 11/24/2016, 2:36 PM

## 2016-12-01 ENCOUNTER — Telehealth: Payer: Self-pay | Admitting: Internal Medicine

## 2016-12-01 MED ORDER — FLUCONAZOLE 150 MG PO TABS: 150.0000 mg | ORAL_TABLET | Freq: Once | ORAL | 0 refills | Status: AC

## 2016-12-01 NOTE — Telephone Encounter (Signed)
° ° ° °  Pt call to say she has a yeast infection and ask if something can be called in for her. She said invokana  is giving her the yeast infection    [facility] [address]

## 2016-12-01 NOTE — Telephone Encounter (Signed)
Remind patient that Monistat-type medicines if she is not allergic works as well as the pill medication. Either 3 days Monistat for 7 days Monistat over-the-counter. However if she also wishes we can send in one pill of Diflucan 150 mg 1     Lab Results  Component Value Date   WBC 12.3 (H) 07/07/2016   HGB 12.2 07/07/2016   HCT 38.9 07/07/2016   PLT 425 (H) 07/07/2016   GLUCOSE 90 11/21/2016   CHOL 164 07/11/2016   TRIG 88.0 07/11/2016   HDL 36.50 (L) 07/11/2016   LDLCALC 110 (H) 07/11/2016   ALT 11 07/11/2016   AST 11 07/11/2016   NA 140 11/21/2016   K 4.1 11/21/2016   CL 108 11/21/2016   CREATININE 0.75 11/21/2016   BUN 13 11/21/2016   CO2 28 11/21/2016   TSH 2.53 07/02/2015   INR 1.05 02/06/2016   HGBA1C 6.8 (H) 11/21/2016   MICROALBUR 1.7 07/11/2016

## 2016-12-01 NOTE — Telephone Encounter (Signed)
Spoke to the pt and she complains of internal vaginal itching.  Small amount of discharge started yesterday and she describes it as "cott[place] cheese".  No fever or abdominal pain.  Tried Monistat but no relief.    [facility]Ok to leave message if no answer when I return call.

## 2016-12-01 NOTE — Telephone Encounter (Signed)
Left a message per pt request.  Advised her that I sent in 1 tablet of Diflucan and to use OTC Monistat for 7 days along with the pill.  Advised a call back if any questions.

## 2016-12-01 NOTE — Telephone Encounter (Signed)
Left a message for a return call.

## 2016-12-01 NOTE — Telephone Encounter (Signed)
Monique Nelson pt returned your call and really would like to have this Rx today.

## 2017-01-08 ENCOUNTER — Ambulatory Visit: Payer: 59 | Admitting: Neurology

## 2017-01-19 ENCOUNTER — Other Ambulatory Visit (INDEPENDENT_AMBULATORY_CARE_PROVIDER_SITE_OTHER): Payer: 59

## 2017-01-19 DIAGNOSIS — E1165 Type 2 diabetes mellitus with hyperglycemia: Secondary | ICD-10-CM

## 2017-01-19 LAB — COMPREHENSIVE METABOLIC PANEL
ALK PHOS: 83 U/L (ref 39–117)
ALT: 18 U/L (ref 0–35)
AST: 16 U/L (ref 0–37)
Albumin: 4 g/dL (ref 3.5–5.2)
BILIRUBIN TOTAL: 0.2 mg/dL (ref 0.2–1.2)
BUN: 15 mg/dL (ref 6–23)
CO2: 27 mEq/L (ref 19–32)
Calcium: 9.7 mg/dL (ref 8.4–10.5)
Chloride: 105 mEq/L (ref 96–112)
Creatinine, Ser: 0.81 mg/dL (ref 0.40–1.20)
GFR: 95.82 mL/min (ref >60.00)
Glucose, Bld: 104 mg/dL — ABNORMAL HIGH (ref 70–99)
POTASSIUM: 3.7 meq/L (ref 3.5–5.1)
SODIUM: 138 meq/L (ref 135–145)
TOTAL PROTEIN: 8.4 g/dL — AB (ref 6.0–8.3)

## 2017-01-19 LAB — LIPID PANEL
CHOLESTEROL: 193 mg/dL (ref 0–200)
HDL: 41.2 mg/dL (ref >39.00)
LDL Cholesterol: 129 mg/dL — ABNORMAL HIGH (ref 0–99)
NONHDL: 151.3
Total CHOL/HDL Ratio: 5
Triglycerides: 114 mg/dL (ref 0.0–149.0)
VLDL: 22.8 mg/dL (ref 0.0–40.0)

## 2017-01-20 LAB — FRUCTOSAMINE: Fructosamine: 230 umol/L (ref 0–285)

## 2017-01-22 ENCOUNTER — Ambulatory Visit (INDEPENDENT_AMBULATORY_CARE_PROVIDER_SITE_OTHER): Payer: 59 | Admitting: Endocrinology

## 2017-01-22 ENCOUNTER — Encounter: Payer: Self-pay | Admitting: Endocrinology

## 2017-01-22 VITALS — BP 104/84 | HR 100 | Ht 61.0 in | Wt 271.0 lb

## 2017-01-22 DIAGNOSIS — E1165 Type 2 diabetes mellitus with hyperglycemia: Secondary | ICD-10-CM | POA: Diagnosis not present

## 2017-01-22 MED ORDER — LIRAGLUTIDE 18 MG/3ML ~~LOC~~ SOPN: PEN_INJECTOR | SUBCUTANEOUS | 3 refills | Status: DC

## 2017-01-22 MED ORDER — FLUCONAZOLE 150 MG PO TABS: 150.0000 mg | ORAL_TABLET | Freq: Once | ORAL | 2 refills | Status: AC

## 2017-01-22 MED ORDER — CANAGLIFLOZIN 300 MG PO TABS: 300.0000 mg | ORAL_TABLET | Freq: Every day | ORAL | 3 refills | Status: DC

## 2017-01-22 MED ORDER — GABAPENTIN 100 MG PO CAPS: 100.0000 mg | ORAL_CAPSULE | Freq: Two times a day (BID) | ORAL | 3 refills | Status: AC

## 2017-01-22 MED ORDER — ROSUVASTATIN CALCIUM 20 MG PO TABS: 20.0000 mg | ORAL_TABLET | Freq: Every day | ORAL | 3 refills | Status: AC

## 2017-01-22 NOTE — Patient Instructions (Addendum)
Check blood sugars on waking up  weekly  Also check blood sugars about 2 hours after a meal and do this after different meals by rotation  Recommended blood sugar levels on waking up is 90-130 and about 2 hours after meal is 130-160  Please bring your blood sugar monitor to each visit, thank you  Walk daily    

## 2017-01-22 NOTE — Progress Notes (Signed)
Patient ID: Monique Nelson, female   DOB: [birth year], 51 y.o.   MRN: [id number]    Reason for Appointment : Followup for Type 2 Diabetes  History of Present Illness          Diagnosis: Type 2 diabetes mellitus, date of diagnosis: 2015       Past history: She has had impaired fasting glucose as per her record since about 2009 with the highest level being 110 Has not had any nutritional counseling or any specific management of this previously On her routine exam her A1c was found to be 6.6 in 11/2013 and random glucose was 112  Recent history:   She was given Victoza instead of Amaryl 2 mg to help with her weight and more consistent control  Her last A1c was relatively higher at 6.8  Current blood sugar patterns and problems identified:  She is here for short-term follow-up because of A1c going up on the last visit  She was told to try and work on her exercise and diet regimen to get her weight down and also start taking readings especially after meals  She has not exercise, not checked sugars and has not lost weight  She has now been having some issues with yeast infection, not relieved by OTC creams; previously did not complain of side effects from [place] and she has been trying to be more regular compared to her last visit   Oral hypoglycemic drugs the patient is taking are: Invokana 100 mg daily  Side effects from medications have been: GI side effects from metformin  Glucose monitoring with One Touch ultra:  as above   Self-care: The diet that the patient has been following is: tries to limit fat intake; for breakfast is having egg whites or [place] sausage Usually having a salad or meat and vegetables at lunch, snacks will be fruit occasionally  Meals: 3 meals per day.           Exercise: Now not  Dietician visit: Most recent: 8/16.               Compliance with the medical regimen: fair    Weight history:  Wt Readings from  Last 3 Encounters:  01/22/17 271 lb (122.9 kg)  11/24/16 270 lb (122.5 kg)  08/08/16 269 lb 12.8 oz (122.4 kg)   Glycemic control:   Lab Results  Component Value Date   HGBA1C 6.8 (H) 11/21/2016   HGBA1C 6.3 05/17/2016   HGBA1C 6.2 07/02/2015   Lab Results  Component Value Date   MICROALBUR 1.7 07/11/2016   LDLCALC 129 (H) 01/19/2017   CREATININE 0.81 01/19/2017    Lab Results  Component Value Date   FRUCTOSAMINE 230 01/19/2017   FRUCTOSAMINE 201 06/23/2016   FRUCTOSAMINE 214 02/27/2014     Allergies as of 01/22/2017      Reactions   Other Other (See Comments)   ALL NARCOTICS-PATIENT PREFERENCE, PAST HISTORY      Medication List       Accurate as of 01/22/17  1:50 PM. Always use your most recent med list.          atorvastatin 20 MG tablet Commonly known as:  LIPITOR Take 1 tablet (20 mg total) by mouth daily.   canagliflozin 100 MG Tabs tablet Commonly known as:  INVOKANA 1 tablet before breakfast   cetirizine 10 MG tablet Commonly known as:  ZYRTEC Take 10 mg by mouth daily as needed for allergies.  cyclobenzaprine 10 MG tablet Commonly known as:  FLEXERIL Take 1 tablet (10 mg total) by mouth 2 (two) times daily as needed for muscle spasms.   dimenhyDRINATE 50 MG tablet Commonly known as:  DRAMAMINE Take 50 mg by mouth every 8 (eight) hours as needed for nausea.   EYE DROPS ALLERGY RELIEF OP Apply 2 drops to eye daily.   gabapentin 100 MG capsule Commonly known as:  NEURONTIN Take 1 capsule (100 mg total) by mouth at bedtime.   liraglutide 18 MG/3ML Sopn Commonly known as:  Nurse, learning disability 1.'8MG'$  INTO THE SKIN EVERY DAY   naproxen 375 MG tablet Commonly known as:  NAPROSYN Take 1 tablet (375 mg total) by mouth 2 (two) times daily.   sodium chloride 0.65 % Soln nasal spray Commonly known as:  OCEAN Place 1 spray into both nostrils as needed for congestion.       Allergies:  Allergies  Allergen Reactions  . Other Other (See Comments)      ALL NARCOTICS-PATIENT PREFERENCE, PAST HISTORY    Past Medical History:  Diagnosis Date  . Diabetes mellitus without complication ([place])   . Drug addiction ([place])    recovering  . Ex-smoker 2009  . History of fracture of wrist 17 years ago   has plate and pins right   . Hx of hysterectomy    bleeding and cysts  . Hx of varicella   . Substance abuse in remission 13 years ago    cocaine heroiin  met   support via ADP  . TIA (transient ischemic attack)   . Vertigo    intermittent infrequent    Past Surgical History:  Procedure Laterality Date  . ABDOMINAL HYSTERECTOMY    . ablastion    . rt hand surgery     17 years ago    Family History  Problem Relation Age of Onset  . COPD Mother   . Diabetes Mother   . Hypertension Mother   . Sleep apnea Mother     and sister   . Thyroid disease Mother   . Chronic fatigue Mother   . Osteoarthritis Mother     knee replacement   . Glaucoma Father   . Other Father     prostate disease  . Alcohol abuse      drug parent    Social History:  reports that she has quit smoking. Her smoking use included Cigarettes. She started smoking about 3 years ago. She has a 0.32 pack-year smoking history. She has never used smokeless tobacco. She reports that she uses drugs, including "Crack" cocaine, Heroin, Methamphetamines, and Marijuana. She reports that she does not drink alcohol.    Review of Systems       Lipids: As follows, currently On Lipitor 20 mg that was started in 2017  She says she is taking this every night  Lab Results  Component Value Date   CHOL 193 01/19/2017   HDL 41.20 01/19/2017   LDLCALC 129 (H) 01/19/2017   TRIG 114.0 01/19/2017   CHOLHDL 5 01/19/2017               No history of hypertension but has family history of hypertension  BP Readings from Last 3 Encounters:  01/22/17 104/84  11/24/16 100/64  08/08/16 102/76    Gabapentin Is being used currently for tingling in the legs but she thinks his symptoms  are little more consistent now, not always relieved by the 100 mg doses.   Physical Examination:  BP 104/84   Pulse 100   Ht '5\' 1"'$  (1.549 m)   Wt 271 lb (122.9 kg)   LMP 12/15/2011   BMI 51.21 kg/m    ASSESSMENT/PLAN:   Diabetes type 2, with obesity    See history of present illness for detailed discussion of current diabetes management, blood sugar patterns and problems identified Her A1c has gone up to 6.8 despite using Invokana instead of Amaryl  This is likely to be from her been inconsistent with diet and not exercising as she was Weight is about the same Also she has not done any readings after meals as discussed previously  For now she will increase her Invokana 300 mg and continue Victoza Discussed needing to check readings after meals, resuming regular exercise and needing follow-up in 2 months again She will start walking regularly for exercise  Recent frequent Candida vaginitis: She can try Diflucan tablets as needed  LIPIDS: We will switch her to Crestor as she is has still high readings on her LDL with Lipitor 20 mg  NEUROPATHY: She needs to increase her walking Also discussed that she continues to 100 mg gabapentin as needed   Navid Lenzen 01/22/2017, 1:50 PM

## 2017-02-15 IMAGING — DX DG CHEST 2V
2 series · 2 of 2 positions shown · non-contrast
Comparison: 10/29/2013 chest radiograph.

CLINICAL DATA: 50 y/o F; left-sided chest pain and pain down the
left arm with tingling.

EXAM:
CHEST  2 VIEW

[chest pa]
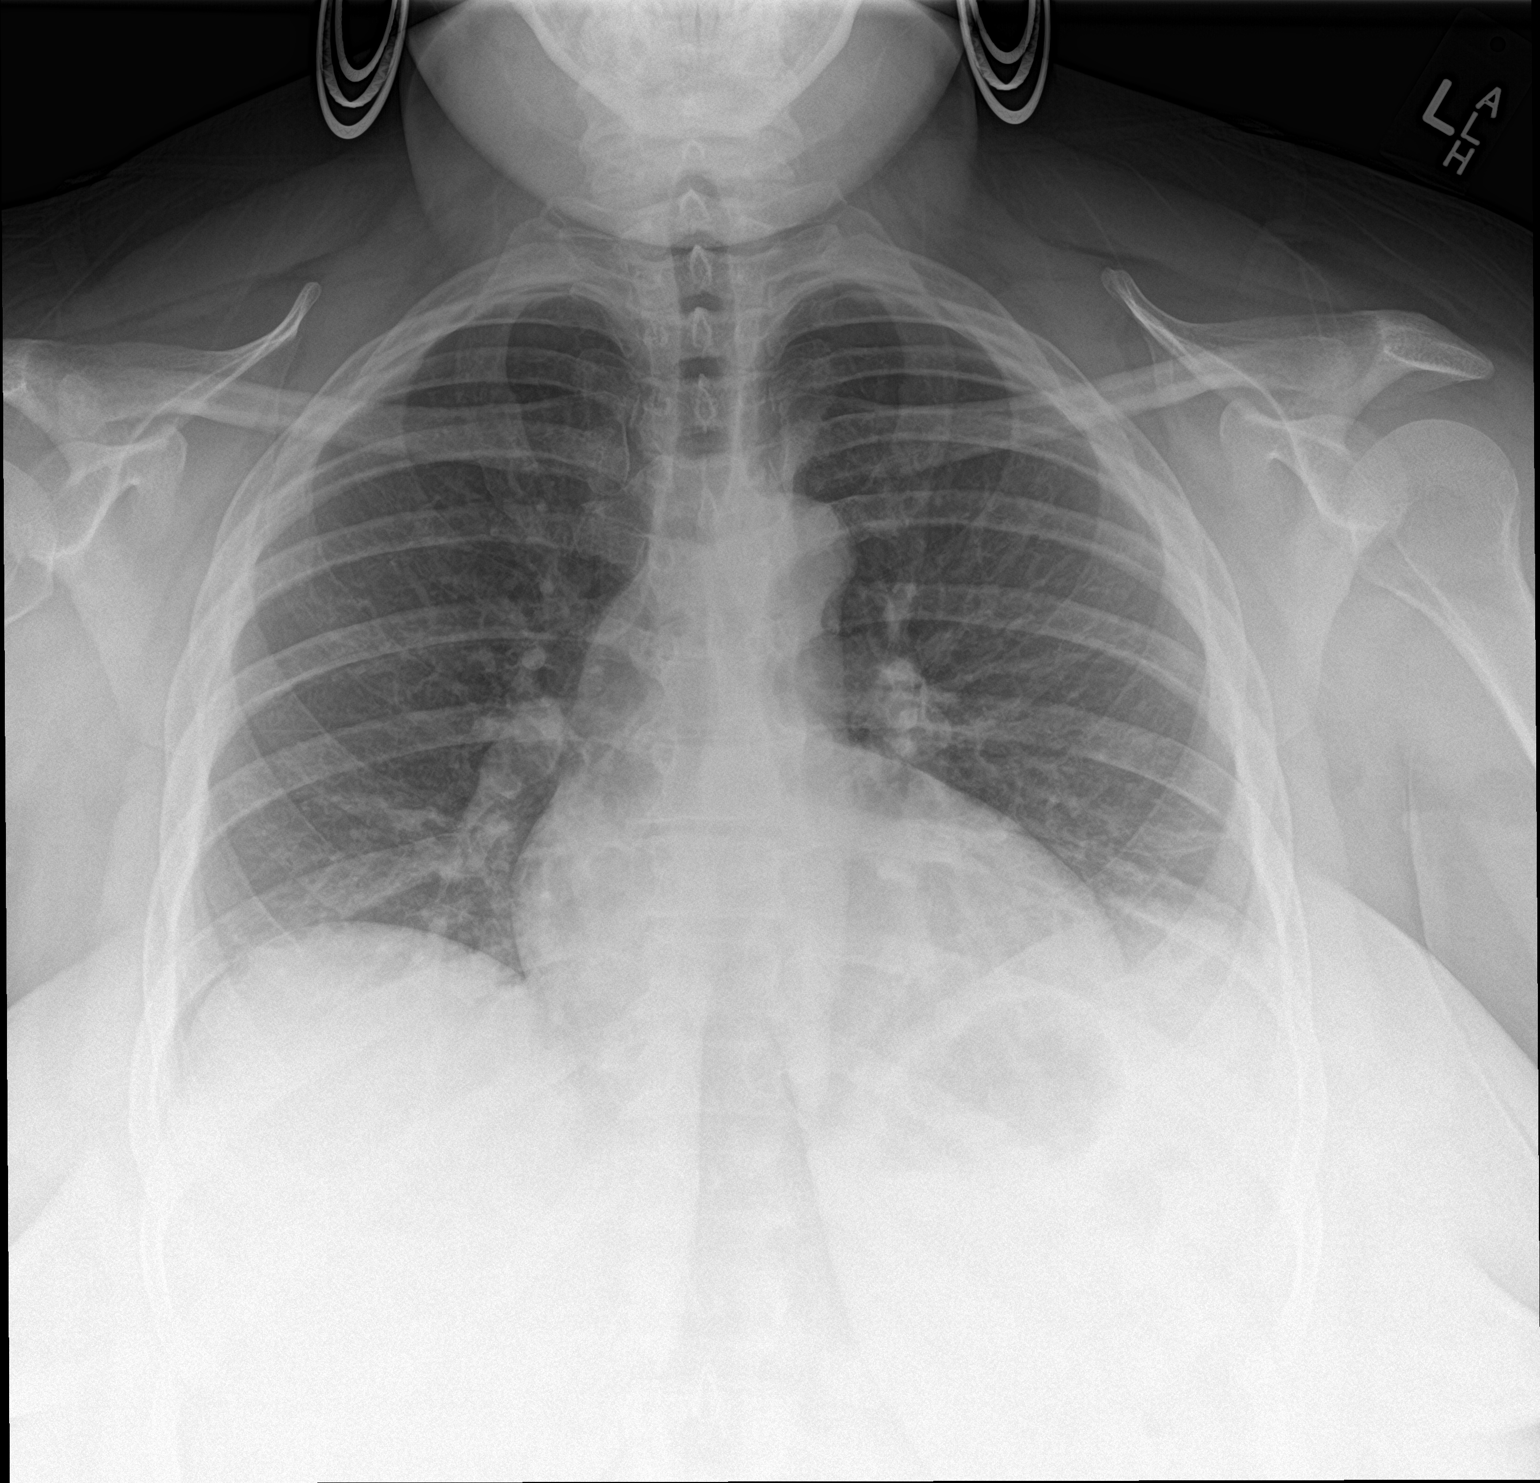

[chest lat]
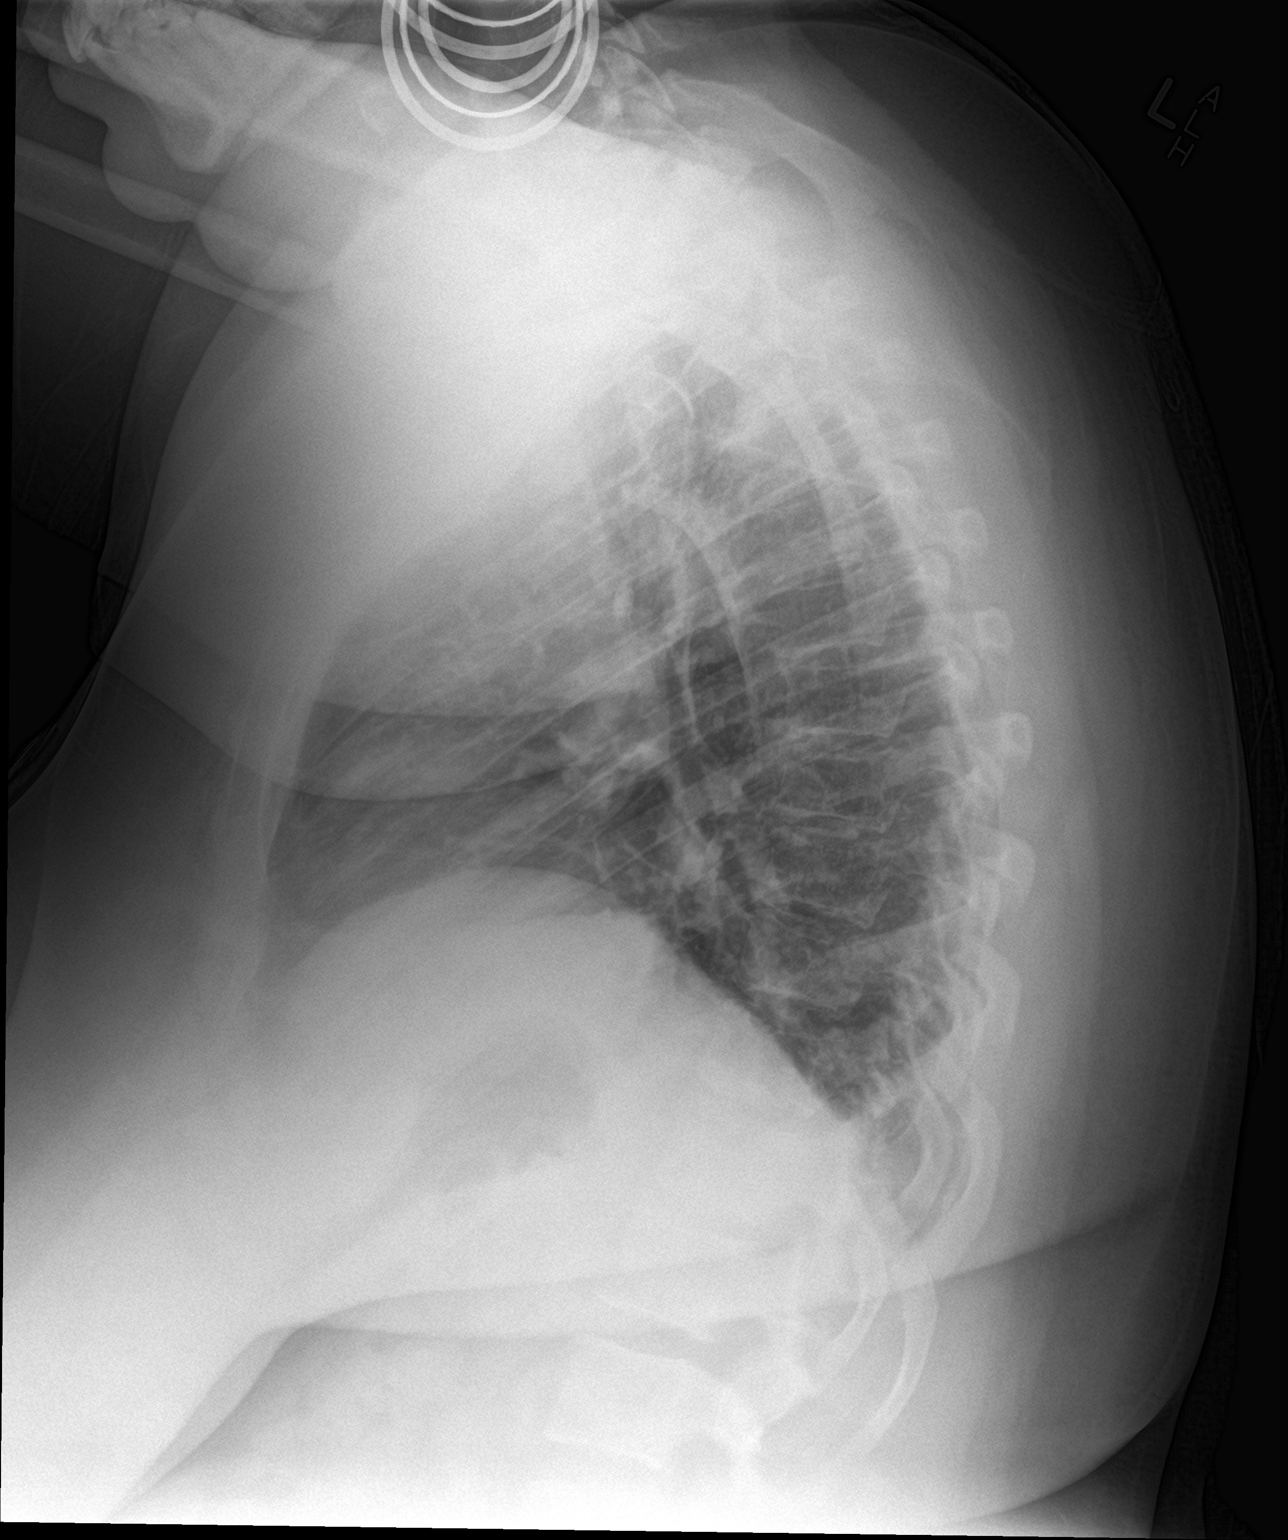

[2 of 2 positions shown; findings below may reference images not displayed]

FINDINGS: Low lung volumes accentuate pulmonary markings. Stable
cardiomediastinal silhouette given projection and technique. Linear
opacities at left lung base probably represent atelectasis. No
pneumothorax. No pleural effusion. No acute osseous abnormality is
evident.
IMPRESSION: Low lung volumes.  No active cardiopulmonary disease.

By: Lueje Raridade M.D.

## 2017-03-07 ENCOUNTER — Telehealth: Payer: Self-pay | Admitting: Internal Medicine

## 2017-03-07 ENCOUNTER — Ambulatory Visit (INDEPENDENT_AMBULATORY_CARE_PROVIDER_SITE_OTHER): Payer: 59 | Admitting: Internal Medicine

## 2017-03-07 ENCOUNTER — Encounter: Payer: Self-pay | Admitting: Internal Medicine

## 2017-03-07 ENCOUNTER — Ambulatory Visit (INDEPENDENT_AMBULATORY_CARE_PROVIDER_SITE_OTHER)
Admission: RE | Admit: 2017-03-07 | Discharge: 2017-03-07 | Disposition: A | Discharge status: Home / Self Care | Payer: 59 | Source: Ambulatory Visit | Attending: Internal Medicine | Admitting: Internal Medicine

## 2017-03-07 VITALS — BP 120/70 | HR 88 | Temp 98.0°F | Ht 61.0 in | Wt 266.4 lb

## 2017-03-07 DIAGNOSIS — J069 Acute upper respiratory infection, unspecified: Secondary | ICD-10-CM

## 2017-03-07 DIAGNOSIS — J988 Other specified respiratory disorders: Secondary | ICD-10-CM

## 2017-03-07 DIAGNOSIS — J0100 Acute maxillary sinusitis, unspecified: Secondary | ICD-10-CM

## 2017-03-07 MED ORDER — DOXYCYCLINE HYCLATE 100 MG PO TABS: 100.0000 mg | ORAL_TABLET | Freq: Two times a day (BID) | ORAL | 0 refills | Status: DC

## 2017-03-07 MED ORDER — ALBUTEROL SULFATE HFA 108 (90 BASE) MCG/ACT IN AERS: 2.0000 | INHALATION_SPRAY | Freq: Four times a day (QID) | RESPIRATORY_TRACT | 1 refills | Status: AC | PRN

## 2017-03-07 MED ORDER — IPRATROPIUM-ALBUTEROL 0.5-2.5 (3) MG/3ML IN SOLN: 3.0000 mL | Freq: Four times a day (QID) | RESPIRATORY_TRACT | Status: AC

## 2017-03-07 NOTE — Patient Instructions (Addendum)
You have some wheezing that is probably triggered by viral infection and allergy. Also prolonged by most likely bacterial sinus infection. Continues the bronchodilator inhaler as needed for chest tightness Antibiotic for sinus infection in addition to your saline and nasal cortisone and take it every day such as Flonase or Nasacort. This will eventually decrease the swelling in your sinuses. Take   Inhaler every 6 hours for wheezing  .  Sometimes we add prednisone also to decrease the swelling if needed. If you're having persistent symptoms we may need to get a chest x-ray. But I don't suspect pneumonia.  Contact us if you're getting worse fever chills or not improving. There is a Saturday clinic if needed.

## 2017-03-07 NOTE — Telephone Encounter (Signed)
Pt calling to get xray results. °

## 2017-03-07 NOTE — Progress Notes (Signed)
Chief Complaint  Patient presents with  . Cough  . Nasal Congestion    HPI: Monique Nelson 51 y.o.  Comes in for sda   resp sx  Last ov with me 4 2017  Under care for  Dm and vb syndrome  And osa on cpap Onset about 3 weeks ago of upper respiratory congestion scratchy throat and a cough like a regular virus or allergy. However it is progressed and continued severe upper respiratory congestion with facial pressure and coughing so much that she is sore on her side to her readings. No fever except maybe beginning no hemoptysis is now having some green nasal drainage and occasional green expectorant. No fever or chills hemoptysis. No history of asthma Remote history of tobacco none currently.  ROS: See pertinent positives and negatives per HPI. No vomiting  See above   Past Medical History:  Diagnosis Date  . Diabetes mellitus without complication ([place])   . Drug addiction ([place])    recovering  . Ex-smoker 2009  . History of fracture of wrist 17 years ago   has plate and pins right   . Hx of hysterectomy    bleeding and cysts  . Hx of varicella   . Substance abuse in remission 13 years ago    cocaine heroiin  met   support via ADP  . TIA (transient ischemic attack)   . Vertigo    intermittent infrequent    Family History  Problem Relation Age of Onset  . COPD Mother   . Diabetes Mother   . Hypertension Mother   . Sleep apnea Mother        and sister   . Thyroid disease Mother   . Chronic fatigue Mother   . Osteoarthritis Mother        knee replacement   . Glaucoma Father   . Other Father        prostate disease  . Alcohol abuse Unknown        drug parent    Social History   Social History  . Marital status: Single    Spouse name: N/A  . Number of children: 1  . Years of education: N/A   Occupational History  .  [employer]   Social History Main Topics  . Smoking status: Former Smoker    Packs/day: 0.04    Years: 8.00    Types: Cigarettes   Start date: 10/21/2013  . Smokeless tobacco: Never Used     Comment: off and on x 8 yrs  . Alcohol use No  . Drug use: Yes    Types: "Crack" cocaine, Heroin, Methamphetamines, Marijuana     Comment: History- 14 years clean-11/21/13  . Sexual activity: Not Asked   Other Topics Concern  . 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 canagliflozin (INVOKANA) 300 MG TABS tablet Take 1 tablet (300 mg total) by mouth daily before breakfast. 30 tablet 3  . cetirizine (ZYRTEC) 10 MG tablet Take 10 mg by mouth daily as needed for allergies.     Marland Kitchen  cyclobenzaprine (FLEXERIL) 10 MG tablet Take 1 tablet (10 mg total) by mouth 2 (two) times daily as needed for muscle spasms. 20 tablet 0  . dimenhyDRINATE (DRAMAMINE) 50 MG tablet Take 50 mg by mouth every 8 (eight) hours as needed for nausea.    Marland Kitchen gabapentin (NEURONTIN) 100 MG capsule Take 1 capsule (100 mg total) by mouth 2 (two) times daily. 60 capsule 3  . liraglutide (VICTOZA) 18 MG/3ML SOPN INJECT 1.'8MG'$  INTO THE SKIN EVERY DAY 6 mL 3  . naproxen (NAPROSYN) 375 MG tablet Take 1 tablet (375 mg total) by mouth 2 (two) times daily. 20 tablet 0  . rosuvastatin (CRESTOR) 20 MG tablet Take 1 tablet (20 mg total) by mouth daily. 30 tablet 3  . sodium chloride (OCEAN) 0.65 % SOLN nasal spray Place 1 spray into both nostrils as needed for congestion. 1 Bottle 0  . Tetrahydrozoline-Zn Sulfate (EYE DROPS ALLERGY RELIEF OP) Apply 2 drops to eye daily.     No facility-administered medications prior to visit.      EXAM:  BP 120/70 (BP Location: Right Arm, Patient Position: Sitting, Cuff Size: Large)   Pulse 88   Temp 98 F (36.7 C) (Oral)   Ht '5\' 1"'$  (1.549 m)   Wt 266  lb 6.4 oz (120.8 kg)   LMP 12/15/2011   SpO2 96%   BMI 50.34 kg/m   Body mass index is 50.34 kg/m. WDWN in NAD  quiet respirations; very nasally congested  somewhat hoarse. Non toxic .  wehezy tight cough  Ill non toxic  HEENT: Normocephalic ;atraumatic , Eyes;  PERRL, EOMs  Full, lids and conjunctiva clear,,Ears: no deformities, canals nl, TM landmarks normal, Nose: no deformity or discharge but congested; 3+ face minimally tender maxillary  Mouth : OP clear without lesion or edema . Neck: Supple without adenopathy or masses or bruits Chest:   Tight  bs = with exp wheezes throughout  No rales rhonchi  CV:  S1-S2 no gallops or murmurs peripheral perfusion is normal Skin :nl perfusion and no acute rashes    ASSESSMENT AND PLAN:  Discussed the following assessment and plan:  Wheezing-associated respiratory infection (WARI) - Plan: DG Chest 2 View  Protracted URI - Plan: DG Chest 2 View  Acute maxillary sinusitis, recurrence not specified Order for x ray placed incase not improving as expected .  Good response to duoneb in office  -Patient advised to return or notify health care team  if symptoms worsen ,persist or new concerns arise.  Patient Instructions  You have some wheezing that is probably triggered by viral infection and allergy. Also prolonged by most likely bacterial sinus infection. Continues the bronchodilator inhaler as needed for chest tightness Antibiotic for sinus infection in addition to your saline and nasal cortisone and take it every day such as Flonase or Nasacort. This will eventually decrease the swelling in your sinuses. Take   Inhaler every 6 hours for wheezing  .  Sometimes we add prednisone also to decrease the swelling if needed. If you're having persistent symptoms we may need to get a chest x-ray. But I don't suspect pneumonia.  Contact us if you're getting worse fever chills or not improving. There is a Saturday clinic if needed.     [name].  Deval Mroczka M.D.

## 2017-03-07 NOTE — Addendum Note (Signed)
Addended by: Dominic PeaHOLSEY, Bridget [place] V. on: 03/07/2017 03:03 PM   Modules accepted: Orders

## 2017-03-08 NOTE — Telephone Encounter (Signed)
Pt viewed results through my chart. Nothing further needed

## 2017-04-13 ENCOUNTER — Telehealth: Payer: Self-pay | Admitting: Endocrinology

## 2017-04-13 NOTE — Telephone Encounter (Signed)
Patient called and states that she has a question on her belviq. She would not give any other information. Verified mobile #

## 2017-04-13 NOTE — Telephone Encounter (Signed)
Called patient and she stated that she has a discount coupon for [employer] and she wants to know if you can resend this [facility] [address]. Please advise.

## 2017-04-16 ENCOUNTER — Other Ambulatory Visit: Payer: Self-pay

## 2017-04-16 ENCOUNTER — Other Ambulatory Visit: Payer: Self-pay | Admitting: Endocrinology

## 2017-04-16 DIAGNOSIS — E1165 Type 2 diabetes mellitus with hyperglycemia: Secondary | ICD-10-CM

## 2017-04-16 MED ORDER — LORCASERIN HCL ER 20 MG PO TB24: 20.0000 mg | ORAL_TABLET | Freq: Every day | ORAL | 0 refills | Status: DC

## 2017-04-16 MED ORDER — LORCASERIN HCL ER 20 MG PO TB24: 20.0000 mg | ORAL_TABLET | Freq: Every day | ORAL | 0 refills | Status: AC

## 2017-04-16 NOTE — Telephone Encounter (Signed)
Called patient and let her know that I was sending her Belviq fax [facility] [address].

## 2017-04-16 NOTE — Telephone Encounter (Signed)
Prescription has been printed.  She needs to make sure she is checking her sugars at various times and bring monitor for download on upcoming visit

## 2017-04-23 ENCOUNTER — Other Ambulatory Visit: Payer: 59

## 2017-04-26 ENCOUNTER — Ambulatory Visit: Payer: 59 | Admitting: Endocrinology

## 2017-05-06 ENCOUNTER — Encounter (HOSPITAL_COMMUNITY): Payer: Self-pay

## 2017-05-06 ENCOUNTER — Emergency Department (HOSPITAL_COMMUNITY)
Admission: EM | Admit: 2017-05-06 | Discharge: 2017-05-06 | Disposition: A | Discharge status: Home / Self Care | Payer: 59 | Attending: Emergency Medicine | Admitting: Emergency Medicine

## 2017-05-06 DIAGNOSIS — R5383 Other fatigue: Secondary | ICD-10-CM | POA: Diagnosis present

## 2017-05-06 DIAGNOSIS — Z87891 Personal history of nicotine dependence: Secondary | ICD-10-CM | POA: Diagnosis not present

## 2017-05-06 DIAGNOSIS — E119 Type 2 diabetes mellitus without complications: Secondary | ICD-10-CM | POA: Insufficient documentation

## 2017-05-06 DIAGNOSIS — E162 Hypoglycemia, unspecified: Secondary | ICD-10-CM | POA: Diagnosis not present

## 2017-05-06 DIAGNOSIS — Z79899 Other long term (current) drug therapy: Secondary | ICD-10-CM | POA: Diagnosis not present

## 2017-05-06 DIAGNOSIS — Z7984 Long term (current) use of oral hypoglycemic drugs: Secondary | ICD-10-CM | POA: Diagnosis not present

## 2017-05-06 LAB — CBC WITH DIFFERENTIAL/PLATELET
BASOS ABS: 0 10*3/uL (ref 0.0–0.1)
BASOS PCT: 0 %
EOS ABS: 0.1 10*3/uL (ref 0.0–0.7)
EOS PCT: 1 %
HCT: 36.4 % (ref 36.0–46.0)
Hemoglobin: 12 g/dL (ref 12.0–15.0)
LYMPHS PCT: 30 %
Lymphs Abs: 3.7 10*3/uL (ref 0.7–4.0)
MCH: 28 pg (ref 26.0–34.0)
MCHC: 33 g/dL (ref 30.0–36.0)
MCV: 84.8 fL (ref 78.0–100.0)
Monocytes Absolute: 0.8 10*3/uL (ref 0.1–1.0)
Monocytes Relative: 6 %
Neutro Abs: 7.6 10*3/uL (ref 1.7–7.7)
Neutrophils Relative %: 63 %
PLATELETS: 386 10*3/uL (ref 150–400)
RBC: 4.29 MIL/uL (ref 3.87–5.11)
RDW: 15.6 % — ABNORMAL HIGH (ref 11.5–15.5)
WBC: 12.2 10*3/uL — AB (ref 4.0–10.5)

## 2017-05-06 LAB — COMPREHENSIVE METABOLIC PANEL
ALBUMIN: 3.7 g/dL (ref 3.5–5.0)
ALK PHOS: 65 U/L (ref 38–126)
ALT: 17 U/L (ref 14–54)
ANION GAP: 8 (ref 5–15)
AST: 18 U/L (ref 15–41)
BILIRUBIN TOTAL: 0.7 mg/dL (ref 0.3–1.2)
BUN: 13 mg/dL (ref 6–20)
CALCIUM: 9.3 mg/dL (ref 8.9–10.3)
CO2: 23 mmol/L (ref 22–32)
Chloride: 108 mmol/L (ref 101–111)
Creatinine, Ser: 0.72 mg/dL (ref 0.44–1.00)
GLUCOSE: 98 mg/dL (ref 65–99)
POTASSIUM: 3.9 mmol/L (ref 3.5–5.1)
Sodium: 139 mmol/L (ref 135–145)
TOTAL PROTEIN: 8 g/dL (ref 6.5–8.1)

## 2017-05-06 LAB — CBG MONITORING, ED
GLUCOSE-CAPILLARY: 112 mg/dL — AB (ref 65–99)
GLUCOSE-CAPILLARY: 115 mg/dL — AB (ref 65–99)

## 2017-05-06 MED ORDER — CANAGLIFLOZIN 100 MG PO TABS: 100.0000 mg | ORAL_TABLET | Freq: Every day | ORAL | 0 refills | Status: AC

## 2017-05-06 NOTE — ED Triage Notes (Signed)
Per EMS, [place] from church.  Pt began feeling shaky and diaphoretic.  Church staff checked cbg and says it was 29.  They fed patient.  Fire Dept got 78 on their arrival.  Vitals: cbg 150 (on ED arrival), 133/75, hr 92, resp 18, 100% ra, 18g Lt Wrist

## 2017-05-06 NOTE — Discharge Instructions (Signed)
I have decreased your invokana to 100mg  daily. Please continue to follow your glucose levels, follow up closely with your doctor regarding medication adjustments.

## 2017-05-06 NOTE — ED Provider Notes (Signed)
[facility] DEPT Provider Note   CSN: [id number] Arrival date & time: 05/06/17  1422     History   Chief Complaint Chief Complaint  Patient presents with  . Hypoglycemia    HPI Monique Nelson is a 51 y.o. female.  HPI   Presents with concern for hypoglycemia. Reports she had breakfast this AM, however while she was at church, developed diaphoresis, shaking, fatigue and her glucose was found to be 29. She was given food and on recheck it was improved.  Reports for the last week her glucose has been on low side, in 50s. Reports she is afraid to sleep as she is worried her sugar will bottom out in her sleep.  Reports she lays awake anxious at night. Denies depression, notes some stress and sadness from loss of loved one but not having depressed thoughts. Reports over the last week as her sugars have been lower she has also had fatigue. Denies infectious ysmptoms including no fever, no urinary symptoms, no abdominal pain, cough nor diarrhea.  Reports she has been walking/exercising over the last week. For the last week she has been taking Belviq but reports missing some doses. Reports bilateral feet burning but not always taking her gabapentin.  Has not had any recent changes in medications.  Reports always feeling low energy since her DM diagnosis.  Past Medical History:  Diagnosis Date  . Diabetes mellitus without complication ([place])   . Drug addiction ([place])    recovering  . Ex-smoker 2009  . History of fracture of wrist 17 years ago   has plate and pins right   . Hx of hysterectomy    bleeding and cysts  . Hx of varicella   . Substance abuse in remission 13 years ago    cocaine heroiin  met   support via ADP  . TIA (transient ischemic attack)   . Vertigo    intermittent infrequent    Patient Active Problem List   Diagnosis Date Noted  . Diabetes mellitus without complication ([place]) 40/98/1191  . Solar dermatitis ? 05/05/2014  . Allergic conjunctivitis and rhinitis  12/23/2013  . Hypokalemia 12/23/2013  . Prediabetes 12/23/2013  . Family history of sleep apnea 11/17/2013  . OSA (obstructive sleep apnea) 11/17/2013  . Snoring 11/17/2013  . Hot flushes, perimenopausal 11/17/2013  . Sleep difficulties 08/05/2013  . Bacterial vaginosis 08/05/2013  . Fasting hyperglycemia 01/10/2013  . Diarrhea 01/10/2013  . Abdominal pain, epigastric 01/10/2013  . Nausea with vomiting 01/10/2013  . FH: Crohn's disease 01/10/2013  . Headache(784.0) 09/10/2012  . Elevated blood pressure reading 09/10/2012  . Menopausal hot flushes 08/02/2012  . Visit for preventive health examination 08/02/2012  . Hx of hysterectomy   . Ex-smoker   . Substance abuse in remission   . Morbid obesity ([place]) 12/10/2007  . Allergic rhinitis 12/10/2007  . VERTIGO 12/10/2007    Past Surgical History:  Procedure Laterality Date  . ABDOMINAL HYSTERECTOMY    . ablastion    . rt hand surgery     17 years ago    OB History    No data available       Home Medications    Prior to Admission medications   Medication Sig Start Date End Date Taking? Authorizing Provider  albuterol (PROVENTIL HFA;VENTOLIN HFA) 108 (90 Base) MCG/ACT inhaler Inhale 2 puffs into the lungs every 6 (six) hours as needed for wheezing or shortness of breath. 03/07/17   Panosh, [name], MD  canagliflozin (INVOKANA) 100 MG  TABS tablet Take 1 tablet (100 mg total) by mouth daily before breakfast. 05/06/17   Gareth Morgan, MD  cetirizine (ZYRTEC) 10 MG tablet Take 10 mg by mouth daily as needed for allergies.     [provider]  cyclobenzaprine (FLEXERIL) 10 MG tablet Take 1 tablet (10 mg total) by mouth 2 (two) times daily as needed for muscle spasms. 07/07/16   Dorie Rank, MD  dimenhyDRINATE (DRAMAMINE) 50 MG tablet Take 50 mg by mouth every 8 (eight) hours as needed for nausea.    [provider]  doxycycline (VIBRA-TABS) 100 MG tablet Take 1 tablet (100 mg total) by mouth 2 (two) times daily.  03/07/17   Panosh, [name], MD  gabapentin (NEURONTIN) 100 MG capsule Take 1 capsule (100 mg total) by mouth 2 (two) times daily. 01/22/17   Elayne Snare, MD  liraglutide (VICTOZA) 18 MG/3ML SOPN INJECT 1.8MG INTO THE SKIN EVERY DAY 01/22/17   Elayne Snare, MD  Lorcaserin HCl ER (BELVIQ XR) 20 MG TB24 Take 20 mg by mouth daily with breakfast. 04/16/17   Elayne Snare, MD  naproxen (NAPROSYN) 375 MG tablet Take 1 tablet (375 mg total) by mouth 2 (two) times daily. 07/07/16   Dorie Rank, MD  rosuvastatin (CRESTOR) 20 MG tablet Take 1 tablet (20 mg total) by mouth daily. 01/22/17   Elayne Snare, MD  sodium chloride (OCEAN) 0.65 % SOLN nasal spray Place 1 spray into both nostrils as needed for congestion. 02/06/16   Charlesetta Shanks, MD  Tetrahydrozoline-Zn Sulfate (EYE DROPS ALLERGY RELIEF OP) Apply 2 drops to eye daily.    [provider]    Family History Family History  Problem Relation Age of Onset  . COPD Mother   . Diabetes Mother   . Hypertension Mother   . Sleep apnea Mother        and sister   . Thyroid disease Mother   . Chronic fatigue Mother   . Osteoarthritis Mother        knee replacement   . Glaucoma Father   . Other Father        prostate disease  . Alcohol abuse Unknown        drug parent    Social History Social History  Substance Use Topics  . Smoking status: Former Smoker    Packs/day: 0.04    Years: 8.00    Types: Cigarettes    Start date: 10/21/2013  . Smokeless tobacco: Never Used     Comment: off and on x 8 yrs  . Alcohol use No     Allergies   Other   Review of Systems Review of Systems  Constitutional: Positive for fatigue. Negative for fever.  HENT: Negative for sore throat.   Eyes: Negative for visual disturbance.  Respiratory: Negative for cough and shortness of breath.   Cardiovascular: Negative for chest pain.  Gastrointestinal: Negative for abdominal pain, diarrhea, nausea and vomiting.  Genitourinary: Negative for difficulty urinating and  dysuria.  Musculoskeletal: Negative for back pain and neck pain.  Skin: Negative for rash.  Neurological: Negative for syncope and headaches.     Physical Exam Updated Vital Signs BP 129/66   Pulse 76   Temp 98.2 F (36.8 C) (Oral)   Resp 18   LMP 12/15/2011   SpO2 97%   Physical Exam  Constitutional: She is oriented to person, place, and time. She appears well-developed and well-nourished. No distress.  HENT:  Head: Normocephalic and atraumatic.  Eyes: Conjunctivae and EOM  are normal.  Neck: Normal range of motion.  Cardiovascular: Normal rate, regular rhythm, normal heart sounds and intact distal pulses.  Exam reveals no gallop and no friction rub.   No murmur heard. Pulmonary/Chest: Effort normal and breath sounds normal. No respiratory distress. She has no wheezes. She has no rales.  Abdominal: Soft. She exhibits no distension. There is no tenderness. There is no guarding.  Musculoskeletal: She exhibits no edema or tenderness.  Neurological: She is alert and oriented to person, place, and time.  Skin: Skin is warm and dry. No rash noted. She is not diaphoretic. No erythema.  Nursing note and vitals reviewed.    ED Treatments / Results  Labs (all labs ordered are listed, but only abnormal results are displayed) Labs Reviewed  CBC WITH DIFFERENTIAL/PLATELET - Abnormal; Notable for the following:       Result Value   WBC 12.2 (*)    RDW 15.6 (*)    All other components within normal limits  CBG MONITORING, ED - Abnormal; Notable for the following:    Glucose-Capillary 112 (*)    All other components within normal limits  CBG MONITORING, ED - Abnormal; Notable for the following:    Glucose-Capillary 115 (*)    All other components within normal limits  COMPREHENSIVE METABOLIC PANEL    EKG  EKG Interpretation None       Radiology No results found.  Procedures Procedures (including critical care time)  Medications Ordered in ED Medications - No data to  display   Initial Impression / Assessment and Plan / ED Course  I have reviewed the triage vital signs and the nursing notes.  Pertinent labs & imaging results that were available during my care of the patient were reviewed by me and considered in my medical decision making (see chart for details).     51yo female with hx of DM on invokana, victoza, recently started belviq for weight loss presents with concern for hypoglycemia. Patient with glucose  29 at church, improved with po glucose ans she is maintaining glucose in ED.  Given fatigue, basic labs obtained showing no renal failure, no anemia, no hepatic failure.  Mild leukocytosis is nonspecific. No infectious symptoms.  Suspect patient likely having increasing glucose lows given she is exercising and taking belviq. Denies other dietary changes. Given lows described by pt at home and episode today, recommend she change her invokana down to 135m from 3014mdaily and follow upclosely with PCP and continue to monitor levels. Patient discharged in stable condition with understanding of reasons to return.   Final Clinical Impressions(s) / ED Diagnoses   Final diagnoses:  Hypoglycemia    New Prescriptions Discharge Medication List as of 05/06/2017  5:36 PM       ScGareth MorganMD 05/06/17 2340

## 2017-05-07 NOTE — Telephone Encounter (Signed)
I got a staff message stating her b/s sugars bottomed out and she wanted a earlier appt.i called patient and  told her there wasn't any availability, she got upset with because of that, I was in the mist of telling her I would send a message back to the nurse to see if the doctor could fit her in sooner but she hung up on me. fyi

## 2017-05-08 NOTE — Telephone Encounter (Signed)
Spoke with the pt she is going to keep the August appts. She was upset that there was no appts for her to be seen same day

## 2017-05-08 NOTE — Telephone Encounter (Signed)
Please check to see if she is still needs to be seen today, this message was received last evening

## 2017-05-08 NOTE — Telephone Encounter (Signed)
Judeth CornfieldStephanie, please check with this patient. I do not see her on the schedule for today. Thanks!

## 2017-06-05 ENCOUNTER — Other Ambulatory Visit (INDEPENDENT_AMBULATORY_CARE_PROVIDER_SITE_OTHER): Payer: 59

## 2017-06-05 DIAGNOSIS — E1165 Type 2 diabetes mellitus with hyperglycemia: Secondary | ICD-10-CM | POA: Diagnosis not present

## 2017-06-05 LAB — COMPREHENSIVE METABOLIC PANEL
ALT: 16 U/L (ref 0–35)
AST: 14 U/L (ref 0–37)
Albumin: 3.6 g/dL (ref 3.5–5.2)
Alkaline Phosphatase: 72 U/L (ref 39–117)
BILIRUBIN TOTAL: 0.3 mg/dL (ref 0.2–1.2)
BUN: 14 mg/dL (ref 6–23)
CHLORIDE: 106 meq/L (ref 96–112)
CO2: 29 meq/L (ref 19–32)
CREATININE: 0.78 mg/dL (ref 0.40–1.20)
Calcium: 9.6 mg/dL (ref 8.4–10.5)
GFR: 99.94 mL/min (ref >60.00)
Glucose, Bld: 138 mg/dL — ABNORMAL HIGH (ref 70–99)
Potassium: 3.9 mEq/L (ref 3.5–5.1)
SODIUM: 139 meq/L (ref 135–145)
Total Protein: 8 g/dL (ref 6.0–8.3)

## 2017-06-05 LAB — LIPID PANEL
CHOL/HDL RATIO: 5
Cholesterol: 159 mg/dL (ref 0–200)
HDL: 35.1 mg/dL — ABNORMAL LOW (ref >39.00)
LDL CALC: 93 mg/dL (ref 0–99)
NonHDL: 123.6
Triglycerides: 153 mg/dL — ABNORMAL HIGH (ref 0.0–149.0)
VLDL: 30.6 mg/dL (ref 0.0–40.0)

## 2017-06-05 LAB — MICROALBUMIN / CREATININE URINE RATIO
CREATININE, U: 236.5 mg/dL
MICROALB/CREAT RATIO: 1.1 mg/g (ref 0.0–30.0)
Microalb, Ur: 2.7 mg/dL — ABNORMAL HIGH (ref 0.0–1.9)

## 2017-06-05 LAB — HEMOGLOBIN A1C: HEMOGLOBIN A1C: 6.9 % — AB (ref 4.6–6.5)

## 2017-06-06 NOTE — Progress Notes (Deleted)
Patient ID: Monique Nelson, female   DOB: 1965-11-25, 51 y.o.   MRN: [id number]    Reason for Appointment : Followup for Type 2 Diabetes  History of Present Illness          Diagnosis: Type 2 diabetes mellitus, date of diagnosis: 2015       Past history: She has had impaired fasting glucose as per her record since about 2009 with the highest level being 110 Has not had any nutritional counseling or any specific management of this previously On her routine exam her A1c was found to be 6.6 in 11/2013 and random glucose was 112  Recent history:   She was given Victoza instead of Amaryl 2 mg to help with her weight and more consistent control  Her last A1c was relatively higher at 6.8  Current blood sugar patterns and problems identified:  She is here for short-term follow-up because of A1c going up on the last visit  She was told to try and work on her exercise and diet regimen to get her weight down and also start taking readings especially after meals  She has not exercise, not checked sugars and has not lost weight  She has now been having some issues with yeast infection, not relieved by OTC creams; previously did not complain of side effects from [place] and she has been trying to be more regular compared to her last visit   Oral hypoglycemic drugs the patient is taking are: Invokana 100 mg daily  Side effects from medications have been: GI side effects from metformin  Glucose monitoring with One Touch ultra:  as above   Self-care: The diet that the patient has been following is: tries to limit fat intake; for breakfast is having egg whites or [place] sausage Usually having a salad or meat and vegetables at lunch, snacks will be fruit occasionally  Meals: 3 meals per day.           Exercise: Now not  Dietician visit: Most recent: 8/16.               Compliance with the medical regimen: fair    Weight history:  Wt Readings from  Last 3 Encounters:  03/07/17 266 lb 6.4 oz (120.8 kg)  01/22/17 271 lb (122.9 kg)  11/24/16 270 lb (122.5 kg)   Glycemic control:   Lab Results  Component Value Date   HGBA1C 6.9 (H) 06/05/2017   HGBA1C 6.8 (H) 11/21/2016   HGBA1C 6.3 05/17/2016   Lab Results  Component Value Date   MICROALBUR 2.7 (H) 06/05/2017   LDLCALC 93 06/05/2017   CREATININE 0.78 06/05/2017    Lab Results  Component Value Date   FRUCTOSAMINE 230 01/19/2017   FRUCTOSAMINE 201 06/23/2016   FRUCTOSAMINE 214 02/27/2014     Allergies as of 06/07/2017      Reactions   Other Other (See Comments)   ALL NARCOTICS-PATIENT PREFERENCE, PAST HISTORY      Medication List       Accurate as of 06/06/17  9:19 PM. Always use your most recent med list.          albuterol 108 (90 Base) MCG/ACT inhaler Commonly known as:  PROVENTIL HFA;VENTOLIN HFA Inhale 2 puffs into the lungs every 6 (six) hours as needed for wheezing or shortness of breath.   canagliflozin 100 MG Tabs tablet Commonly known as:  INVOKANA Take 1 tablet (100 mg total) by mouth daily before breakfast.  cetirizine 10 MG tablet Commonly known as:  ZYRTEC Take 10 mg by mouth daily as needed for allergies.   cyclobenzaprine 10 MG tablet Commonly known as:  FLEXERIL Take 1 tablet (10 mg total) by mouth 2 (two) times daily as needed for muscle spasms.   dimenhyDRINATE 50 MG tablet Commonly known as:  DRAMAMINE Take 50 mg by mouth every 8 (eight) hours as needed for nausea.   doxycycline 100 MG tablet Commonly known as:  VIBRA-TABS Take 1 tablet (100 mg total) by mouth 2 (two) times daily.   EYE DROPS ALLERGY RELIEF OP Apply 2 drops to eye daily.   gabapentin 100 MG capsule Commonly known as:  NEURONTIN Take 1 capsule (100 mg total) by mouth 2 (two) times daily.   liraglutide 18 MG/3ML Sopn Commonly known as:  Nurse, learning disability 1.'8MG'$  INTO THE SKIN EVERY DAY   Lorcaserin HCl ER 20 MG Tb24 Commonly known as:  BELVIQ XR Take 20 mg by  mouth daily with breakfast.   naproxen 375 MG tablet Commonly known as:  NAPROSYN Take 1 tablet (375 mg total) by mouth 2 (two) times daily.   rosuvastatin 20 MG tablet Commonly known as:  CRESTOR Take 1 tablet (20 mg total) by mouth daily.   sodium chloride 0.65 % Soln nasal spray Commonly known as:  OCEAN Place 1 spray into both nostrils as needed for congestion.       Allergies:  Allergies  Allergen Reactions  . Other Other (See Comments)    ALL NARCOTICS-PATIENT PREFERENCE, PAST HISTORY    Past Medical History:  Diagnosis Date  . Diabetes mellitus without complication ([place])   . Drug addiction ([place])    recovering  . Ex-smoker 2009  . History of fracture of wrist 17 years ago   has plate and pins right   . Hx of hysterectomy    bleeding and cysts  . Hx of varicella   . Substance abuse in remission 13 years ago    cocaine heroiin  met   support via ADP  . TIA (transient ischemic attack)   . Vertigo    intermittent infrequent    Past Surgical History:  Procedure Laterality Date  . ABDOMINAL HYSTERECTOMY    . ablastion    . rt hand surgery     17 years ago    Family History  Problem Relation Age of Onset  . COPD Mother   . Diabetes Mother   . Hypertension Mother   . Sleep apnea Mother        and sister   . Thyroid disease Mother   . Chronic fatigue Mother   . Osteoarthritis Mother        knee replacement   . Glaucoma Father   . Other Father        prostate disease  . Alcohol abuse Unknown        drug parent    Social History:  reports that she has quit smoking. Her smoking use included Cigarettes. She started smoking about 3 years ago. She has a 0.32 pack-year smoking history. She has never used smokeless tobacco. She reports that she uses drugs, including "Crack" cocaine, Heroin, Methamphetamines, and Marijuana. She reports that she does not drink alcohol.    Review of Systems       Lipids: As follows, currently On Lipitor 20 mg that was  started in 2017  She says she is taking this every night  Lab Results  Component Value Date   CHOL  159 06/05/2017   HDL 35.10 (L) 06/05/2017   LDLCALC 93 06/05/2017   TRIG 153.0 (H) 06/05/2017   CHOLHDL 5 06/05/2017               No history of hypertension but has family history of hypertension  BP Readings from Last 3 Encounters:  05/06/17 129/66  03/07/17 120/70  01/22/17 104/84    Gabapentin Is being used currently for tingling in the legs but she thinks his symptoms are little more consistent now, not always relieved by the 100 mg doses.   Physical Examination:  LMP 12/15/2011    ASSESSMENT/PLAN:   Diabetes type 2, with obesity    See history of present illness for detailed discussion of current diabetes management, blood sugar patterns and problems identified Her A1c has gone up to 6.8 despite using Invokana instead of Amaryl  This is likely to be from her been inconsistent with diet and not exercising as she was Weight is about the same Also she has not done any readings after meals as discussed previously  For now she will increase her Invokana 300 mg and continue Victoza Discussed needing to check readings after meals, resuming regular exercise and needing follow-up in 2 months again She will start walking regularly for exercise  Recent frequent Candida vaginitis: She can try Diflucan tablets as needed  LIPIDS: We will switch her to Crestor as she is has still high readings on her LDL with Lipitor 20 mg  NEUROPATHY: She needs to increase her walking Also discussed that she continues to 100 mg gabapentin as needed   Monique Nelson 06/06/2017, 9:19 PM

## 2017-06-07 ENCOUNTER — Ambulatory Visit: Payer: 59 | Admitting: Endocrinology

## 2017-06-21 NOTE — Progress Notes (Deleted)
No chief complaint on file.   HPI: Patient  Monique Nelson  51 y.o. comes in today for Preventive Health Care visit   Health Maintenance  Topic Date Due  . PNEUMOCOCCAL POLYSACCHARIDE VACCINE (1) 12/07/1967  . OPHTHALMOLOGY EXAM  12/07/1975  . HIV Screening  12/06/1980  . PAP SMEAR  01/01/2015  . COLONOSCOPY  12/07/2015  . MAMMOGRAM  06/18/2016  . FOOT EXAM  07/06/2016  . TETANUS/TDAP  07/06/2016  . INFLUENZA VACCINE  05/16/2017  . HEMOGLOBIN A1C  12/06/2017  . URINE MICROALBUMIN  06/05/2018   Health Maintenance Review LIFESTYLE:  Exercise:   Tobacco/ETS: Alcohol:  Sugar beverages: Sleep: Drug use: no HH of  Work:    ROS:  GEN/ HEENT: No fever, significant weight changes sweats headaches vision problems hearing changes, CV/ PULM; No chest pain shortness of breath cough, syncope,edema  change in exercise tolerance. GI /GU: No adominal pain, vomiting, change in bowel habits. No blood in the stool. No significant GU symptoms. SKIN/HEME: ,no acute skin rashes suspicious lesions or bleeding. No lymphadenopathy, nodules, masses.  NEURO/ PSYCH:  No neurologic signs such as weakness numbness. No depression anxiety. IMM/ Allergy: No unusual infections.  Allergy .   REST of 12 system review negative except as per HPI   Past Medical History:  Diagnosis Date  . Diabetes mellitus without complication ([place])   . Drug addiction ([place])    recovering  . Ex-smoker 2009  . History of fracture of wrist 17 years ago   has plate and pins right   . Hx of hysterectomy    bleeding and cysts  . Hx of varicella   . Substance abuse in remission 13 years ago    cocaine heroiin  met   support via ADP  . TIA (transient ischemic attack)   . Vertigo    intermittent infrequent    Past Surgical History:  Procedure Laterality Date  . ABDOMINAL HYSTERECTOMY    . ablastion    . rt hand surgery     17 years ago    Family History  Problem Relation Age of Onset  . COPD Mother   .  Diabetes Mother   . Hypertension Mother   . Sleep apnea Mother        and sister   . Thyroid disease Mother   . Chronic fatigue Mother   . Osteoarthritis Mother        knee replacement   . Glaucoma Father   . Other Father        prostate disease  . Alcohol abuse Unknown        drug parent    Social History   Social History  . Marital status: Single    Spouse name: N/A  . Number of children: 1  . Years of education: N/A   Occupational History  .  [employer]   Social History Main Topics  . Smoking status: Former Smoker    Packs/day: 0.04    Years: 8.00    Types: Cigarettes    Start date: 10/21/2013  . Smokeless tobacco: Never Used     Comment: off and on x 8 yrs  . Alcohol use No  . Drug use: Yes    Types: "Crack" cocaine, Heroin, Methamphetamines, Marijuana     Comment: History- 14 years clean-11/21/13  . Sexual activity: Not on file   Other Topics Concern  . 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 albuterol (PROVENTIL HFA;VENTOLIN HFA) 108 (90 Base) MCG/ACT inhaler Inhale 2 puffs into the lungs every 6 (six) hours as needed for wheezing or shortness of breath. 1 Inhaler 1  . canagliflozin (INVOKANA) 100 MG TABS tablet Take 1 tablet (100 mg total) by mouth daily before breakfast. 30 tablet 0  . cetirizine (ZYRTEC) 10 MG tablet Take 10 mg by mouth daily as needed for allergies.     . cyclobenzaprine (FLEXERIL) 10 MG tablet Take 1 tablet (10 mg total) by mouth 2 (two) times daily as needed for muscle spasms. 20 tablet 0  . dimenhyDRINATE (DRAMAMINE) 50 MG tablet Take 50 mg by mouth every 8 (eight) hours as needed for nausea.    Marland Kitchen doxycycline (VIBRA-TABS) 100 MG  tablet Take 1 tablet (100 mg total) by mouth 2 (two) times daily. 14 tablet 0  . gabapentin (NEURONTIN) 100 MG capsule Take 1 capsule (100 mg total) by mouth 2 (two) times daily. 60 capsule 3  . liraglutide (VICTOZA) 18 MG/3ML SOPN INJECT 1.8MG INTO THE SKIN EVERY DAY 6 mL 3  . Lorcaserin HCl ER (BELVIQ XR) 20 MG TB24 Take 20 mg by mouth daily with breakfast. 30 tablet 0  . naproxen (NAPROSYN) 375 MG tablet Take 1 tablet (375 mg total) by mouth 2 (two) times daily. 20 tablet 0  . rosuvastatin (CRESTOR) 20 MG tablet Take 1 tablet (20 mg total) by mouth daily. 30 tablet 3  . sodium chloride (OCEAN) 0.65 % SOLN nasal spray Place 1 spray into both nostrils as needed for congestion. 1 Bottle 0  . Tetrahydrozoline-Zn Sulfate (EYE DROPS ALLERGY RELIEF OP) Apply 2 drops to eye daily.     Facility-Administered Medications Prior to Visit  Medication Dose Route Frequency Provider Last Rate Last Dose  . ipratropium-albuterol (DUONEB) 0.5-2.5 (3) MG/3ML nebulizer solution 3 mL  3 mL Nebulization Q6H Armiyah Capron, [name], MD         EXAM:  LMP 12/15/2011   There is no height or weight on file to calculate BMI. Wt Readings from Last 3 Encounters:  03/07/17 266 lb 6.4 oz (120.8 kg)  01/22/17 271 lb (122.9 kg)  11/24/16 270 lb (122.5 kg)    Physical Exam: Vital signs reviewed W[facility]:ENID is a well-developed well-nourished alert cooperative    who appearsr stated age in no acute distress.  HEENT: normocephalic atraumatic , Eyes: PERRL EOM's full, conjunctiva clear, Nares: paten,t no deformity discharge or tenderness., Ears: no deformity EAC's clear TMs with normal landmarks. Mouth: clear OP, no lesions, edema.  Moist mucous membranes. Dentition in adequate repair. NECK: supple without masses, thyromegaly or bruits. CHEST/PULM:  Clear to auscultation and percussion breath sounds equal no wheeze , rales or rhonchi. No chest wall deformities or tenderness. Breast: normal by inspection . No dimpling, discharge,  masses, tenderness or discharge . CV: PMI is nondisplaced, S1 S2 no gallops, murmurs, rubs. Peripheral pulses are full without delay.No JVD .  ABDOMEN: Bowel sounds normal nontender  No guard or rebound, no hepato splenomegal no CVA tenderness.  No hernia. Extremtities:  No clubbing cyanosis or edema, no acute joint swelling  or redness no focal atrophy NEURO:  Oriented x3, cranial nerves 3-12 appear to be intact, no obvious focal weakness,gait within normal limits no abnormal reflexes or asymmetrical SKIN: No acute rashes normal turgor, color, no bruising or petechiae. PSYCH: Oriented, good eye contact, no obvious depression anxiety, cognition and judgment appear normal. LN: no cervical axillary inguinal adenopathy  Lab Results  Component Value Date   WBC 12.2 (H) 05/06/2017   HGB 12.0 05/06/2017   HCT 36.4 05/06/2017   PLT 386 05/06/2017   GLUCOSE 138 (H) 06/05/2017   CHOL 159 06/05/2017   TRIG 153.0 (H) 06/05/2017   HDL 35.10 (L) 06/05/2017   LDLCALC 93 06/05/2017   ALT 16 06/05/2017   AST 14 06/05/2017   NA 139 06/05/2017   K 3.9 06/05/2017   CL 106 06/05/2017   CREATININE 0.78 06/05/2017   BUN 14 06/05/2017   CO2 29 06/05/2017   TSH 2.53 07/02/2015   INR 1.05 02/06/2016   HGBA1C 6.9 (H) 06/05/2017   MICROALBUR 2.7 (H) 06/05/2017    BP Readings from Last 3 Encounters:  05/06/17 129/66  03/07/17 120/70  01/22/17 104/84    Lab results reviewed with patient   ASSESSMENT AND PLAN:  Discussed the following assessment and plan:  Visit for preventive health examination  Medication management  Morbid obesity [facility])  Patient Care Team: Burnis Medin, MD as PCP - General (Internal Medicine) Elayne Snare, MD as Consulting Physician (Endocrinology) Rigoberto Noel, MD as Consulting Physician (Pulmonary Disease) There are no Patient Instructions on file for this visit.  [name]. [facility] M.D.

## 2017-06-26 ENCOUNTER — Encounter: Payer: 59 | Admitting: Internal Medicine

## 2017-07-06 ENCOUNTER — Encounter: Payer: Self-pay | Admitting: Internal Medicine

## 2017-08-02 ENCOUNTER — Ambulatory Visit: Payer: 59 | Admitting: Adult Health

## 2017-08-16 NOTE — Progress Notes (Signed)
Chief Complaint  Patient presents with  . Annual Exam    Pap today. Burning and cramping with urination. Cramping with bowel movements. Pain is in lower abdomen and lower. Mucus and blood in stool. Blood in urine.     HPI: Patient  Monique Nelson  51 y.o. comes in today for Preventive Health Care visit     Dm managed  With Dr Dwyane Dee   Doing ok     Also has had sever dysuria for 2 days without fever but hurts much to urinate and even have bm  And abd pain   Dysuria   For 2 days .   No lolow grade fever  Some blood    . And bms  mucousy .   Remote hx of  uti long time ago   Had hysterectomy  .  Benign reasons  ? Pap  No cancer    Health Maintenance  Topic Date Due  . PNEUMOCOCCAL POLYSACCHARIDE VACCINE (1) 12/07/1967  . HIV Screening  12/06/1980  . PAP SMEAR  01/01/2015  . COLONOSCOPY  12/07/2015  . MAMMOGRAM  06/18/2016  . FOOT EXAM  07/06/2016  . TETANUS/TDAP  07/06/2016  . HEMOGLOBIN A1C  12/06/2017  . URINE MICROALBUMIN  06/05/2018  . OPHTHALMOLOGY EXAM  06/16/2018  . INFLUENZA VACCINE  Completed   Health Maintenance Review LIFESTYLE:  Exercise:   yeahs   Tobacco/ETS: no Alcohol:  min Sugar beverages: Sleep: about  7 hours  Drug use: no  HH of  1 no pets Work: 40 hour from hom e    ROS:  GEN/ HEENT: No fever, significant weight changes sweats headaches vision problems hearing changes, CV/ PULM; No chest pain shortness of breath cough, syncope,edema  change in exercise tolerance. GI /GU:  adominal pain, no vomiting, se above SKIN/HEME: ,no acute skin rashes suspicious lesions or bleeding. No lymphadenopathy, nodules, masses.  NEURO/ PSYCH:  No neurologic signs such as weakness numbness. No depression anxiety. IMM/ Allergy: No unusual infections.  Allergy .   REST of 12 system review negative except as per HPI   Past Medical History:  Diagnosis Date  . Diabetes mellitus without complication ([place])   . Drug addiction ([place])    recovering  . Ex-smoker 2009   . History of fracture of wrist 17 years ago   has plate and pins right   . Hx of hysterectomy    bleeding and cysts  . Hx of varicella   . Substance abuse in remission ([place]) 13 years ago    cocaine heroiin  met   support via ADP  . TIA (transient ischemic attack)   . Vertigo    intermittent infrequent    Past Surgical History:  Procedure Laterality Date  . ABDOMINAL HYSTERECTOMY    . ablastion    . rt hand surgery     17 years ago    Family History  Problem Relation Age of Onset  . COPD Mother   . Diabetes Mother   . Hypertension Mother   . Sleep apnea Mother        and sister   . Thyroid disease Mother   . Chronic fatigue Mother   . Osteoarthritis Mother        knee replacement   . Glaucoma Father   . Other Father        prostate disease  . Alcohol abuse Unknown        drug parent    Social History   Social  History  . Marital status: Single    Spouse name: N/A  . Number of children: 1  . Years of education: N/A   Occupational History  .  [employer]   Social History Main Topics  . Smoking status: Former Smoker    Packs/day: 0.04    Years: 8.00    Types: Cigarettes    Start date: 10/21/2013  . Smokeless tobacco: Never Used     Comment: off and on x 8 yrs  . Alcohol use No  . Drug use: Yes    Types: "Crack" cocaine, Heroin, Methamphetamines, Marijuana     Comment: History- 14 years clean-11/21/13  . Sexual activity: Not Asked   Other Topics Concern  . 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 albuterol (PROVENTIL HFA;VENTOLIN HFA) 108 (90 Base) MCG/ACT inhaler Inhale 2 puffs into the lungs every 6 (six) hours as needed for wheezing or shortness of  breath. 1 Inhaler 1  . canagliflozin (INVOKANA) 100 MG TABS tablet Take 1 tablet (100 mg total) by mouth daily before breakfast. 30 tablet 0  . cetirizine (ZYRTEC) 10 MG tablet Take 10 mg by mouth daily as needed for allergies.     . cyclobenzaprine (FLEXERIL) 10 MG tablet Take 1 tablet (10 mg total) by mouth 2 (two) times daily as needed for muscle spasms. 20 tablet 0  . dimenhyDRINATE (DRAMAMINE) 50 MG tablet Take 50 mg by mouth every 8 (eight) hours as needed for nausea.    Marland Kitchen doxycycline (VIBRA-TABS) 100 MG tablet Take 1 tablet (100 mg total) by mouth 2 (two) times daily. 14 tablet 0  . gabapentin (NEURONTIN) 100 MG capsule Take 1 capsule (100 mg total) by mouth 2 (two) times daily. 60 capsule 3  . liraglutide (VICTOZA) 18 MG/3ML SOPN INJECT 1.'8MG'$  INTO THE SKIN EVERY DAY 6 mL 3  . Lorcaserin HCl ER (BELVIQ XR) 20 MG TB24 Take 20 mg by mouth daily with breakfast. 30 tablet 0  . naproxen (NAPROSYN) 375 MG tablet Take 1 tablet (375 mg total) by mouth 2 (two) times daily. 20 tablet 0  . rosuvastatin (CRESTOR) 20 MG tablet Take 1 tablet (20 mg total) by mouth daily. 30 tablet 3  . sodium chloride (OCEAN) 0.65 % SOLN nasal spray Place 1 spray into both nostrils as needed for congestion. 1 Bottle 0  . Tetrahydrozoline-Zn Sulfate (EYE DROPS ALLERGY RELIEF OP) Apply 2 drops to eye daily.     Facility-Administered Medications Prior to Visit  Medication Dose Route Frequency Provider Last Rate Last Dose  . ipratropium-albuterol (DUONEB) 0.5-2.5 (3) MG/3ML nebulizer solution 3 mL  3 mL Nebulization Q6H Panosh, [name], MD         EXAM:  BP 124/82 (BP Location: Right Arm, Patient Position: Sitting, Cuff Size: Large)   Pulse 85   Temp 98 F (36.7 C) (Oral)   Ht 5' 1.25" (1.556 m)   Wt 267 lb 12.8 oz (121.5 kg)   LMP 12/15/2011   BMI 50.19 kg/m   Body mass index is 50.19 kg/m. Wt Readings from Last 3 Encounters:  08/17/17 267 lb 12.8 oz (121.5 kg)  03/07/17 266 lb 6.4 oz (120.8 kg)  01/22/17  271 lb (122.9 kg)    Physical Exam: Vital signs reviewed GDJ:MEQA is a well-developed well-nourished alert cooperative    who appearsr stated age in no  Mild distress  Suprapubic area  HEENT: normocephalic atraumatic , Eyes: PERRL EOM's full, conjunctiva clear, Nares: paten,t no deformity discharge or tenderness., Ears: no deformity EAC's clear TMs with normal landmarks. Mouth: clear OP, no lesions, edema.  Moist mucous membranes. Dentition in adequate repair. NECK: supple without masses, thyromegaly or bruits. CHEST/PULM:  Clear to auscultation and percussion breath sounds equal no wheeze , rales or rhonchi. No chest wall deformities or tenderness. Breast: normal by inspection . No dimpling, discharge, masses, tenderness or discharge . CV: PMI is nondisplaced, S1 S2 no gallops, murmurs, rubs. Peripheral pulses are full without delay.No JVD .  ABDOMEN: Bowel sounds normal suprapubic  Without mass ntender  No guard or rebound, no hepato splenomegal no CVA tenderness.  . Extremtities:  No clubbing cyanosis or edema, no acute joint swelling or redness no focal atrophy NEURO:  Oriented x3, cranial nerves 3-12 appear to be intact, no obvious focal weakness,gait within normal limits no abnormal reflexes or asymmetrical SKIN: No acute rashes normal turgor, color, no bruising or petechiae. PSYCH: Oriented, good eye contact, no obvious depression anxiety, cognition and judgment appear normal. LN: no cervical axillary inguinal adenopathy EXT GU  Nl no lesion    Pink mucosa  Vag  bnl lesion white dc  Non tender rectum but some urethra area   Lab Results  Component Value Date   WBC 12.2 (H) 05/06/2017   HGB 12.0 05/06/2017   HCT 36.4 05/06/2017   PLT 386 05/06/2017   GLUCOSE 138 (H) 06/05/2017   CHOL 159 06/05/2017   TRIG 153.0 (H) 06/05/2017   HDL 35.10 (L) 06/05/2017   LDLCALC 93 06/05/2017   ALT 16 06/05/2017   AST 14 06/05/2017   NA 139 06/05/2017   K 3.9 06/05/2017   CL 106 06/05/2017    CREATININE 0.78 06/05/2017   BUN 14 06/05/2017   CO2 29 06/05/2017   TSH 2.53 07/02/2015   INR 1.05 02/06/2016   HGBA1C 6.9 (H) 06/05/2017   MICROALBUR 2.7 (H) 06/05/2017    BP Readings from Last 3 Encounters:  08/17/17 124/82  05/06/17 129/66  03/07/17 120/70   ua pos blood protein      ASSESSMENT AND PLAN:  Discussed the following assessment and plan:  Visit for preventive health examination  Encounter for gynecological examination without abnormal finding - although abd pain see text   Dysuria - presumed acute cystitis  base on hx  . ad dantibiotic emproitically  - Plan: POC Urinalysis Dipstick, Urine Culture, Urine Culture  Screen for colon cancer - Plan: Ambulatory referral to Gastroenterology  Abdominal pain, unspecified abdominal location  BMI 50.0-59.9, adult ([place]) Presumed acute cystitis UTI although has significant abdominal pain I do not feel urinary retention.  Will send for culture empiric antibiotic and bladder anesthetic treatment.  Follow-up if persistent progressive or atypical symptoms. Medication management by Dr. Dwyane Dee diabetes etc. encouraged healthy weight loss as we discussed this. Refer for colon cancer screening. Patient Care Team: Panosh, [name], MD as PCP - General (Internal Medicine) Elayne Snare, MD as Consulting Physician (Endocrinology) Rigoberto Noel, MD as Consulting Physician (Pulmonary Disease) Patient Instructions   You will be contacted about referral for colonoscopy screening. Your symptoms are consistent with  a bad urinary infection bladder infection. We will send an antibiotic and if not improved to reevaluate.  And   Continued work on diabetes control weight control.  And let us know how we can help.  Wt Readings from Last 3 Encounters:  08/17/17 267 lb 12.8 oz (121.5 kg)  03/07/17 266 lb 6.4 oz (120.8 kg)  01/22/17 271 lb (122.9 kg)   BP Readings from Last 3 Encounters:  08/17/17 124/82  05/06/17 129/66  03/07/17 120/70       Preventive Care 40-64 Years, Female Preventive care refers to lifestyle choices and visits with your health care provider that can promote health and wellness. What does preventive care include?  A yearly physical exam. This is also called an annual well check.  Dental exams once or twice a year.  Routine eye exams. Ask your health care provider how often you should have your eyes checked.  Personal lifestyle choices, including: ? Daily care of your teeth and gums. ? Regular physical activity. ? Eating a healthy diet. ? Avoiding tobacco and drug use. ? Limiting alcohol use. ? Practicing safe sex. ? Taking low-dose aspirin daily starting at age 31. ? Taking vitamin and mineral supplements as recommended by your health care provider. What happens during an annual well check? The services and screenings done by your health care provider during your annual well check will depend on your age, overall health, lifestyle risk factors, and family history of disease. Counseling Your health care provider may ask you questions about your:  Alcohol use.  Tobacco use.  Drug use.  Emotional well-being.  Home and relationship well-being.  Sexual activity.  Eating habits.  Work and work Statistician.  Method of birth control.  Menstrual cycle.  Pregnancy history.  Screening You may have the following tests or measurements:  Height, weight, and BMI.  Blood pressure.  Lipid and cholesterol levels. These may be checked every 5 years, or more frequently if you are over 55 years old.  Skin check.  Lung cancer screening. You may have this screening every year starting at age 43 if you have a 30-pack-year history of smoking and currently smoke or have quit within the past 15 years.  Fecal occult blood test (FOBT) of the stool. You may have this test every year starting at age 76.  Flexible sigmoidoscopy or colonoscopy. You may have a sigmoidoscopy every 5 years or a  colonoscopy every 10 years starting at age 46.  Hepatitis C blood test.  Hepatitis B blood test.  Sexually transmitted disease (STD) testing.  Diabetes screening. This is done by checking your blood sugar (glucose) after you have not eaten for a while (fasting). You may have this done every 1-3 years.  Mammogram. This may be done every 1-2 years. Talk to your health care provider about when you should start having regular mammograms. This may depend on whether you have a family history of breast cancer.  BRCA-related cancer screening. This may be done if you have a family history of breast, ovarian, tubal, or peritoneal cancers.  Pelvic exam and Pap test. This may be done every 3 years starting at age 32. Starting at age 3, this may be done every 5 years if you have a Pap test in combination with an HPV test.  Bone density scan. This is done to screen for osteoporosis. You may have this scan if you are at high risk for osteoporosis.  Discuss your test results, treatment options, and if necessary, the need  for more tests with your health care provider. Vaccines Your health care provider may recommend certain vaccines, such as:  Influenza vaccine. This is recommended every year.  Tetanus, diphtheria, and acellular pertussis (Tdap, Td) vaccine. You may need a Td booster every 10 years.  Varicella vaccine. You may need this if you have not been vaccinated.  Zoster vaccine. You may need this after age 29.  Measles, mumps, and rubella (MMR) vaccine. You may need at least one dose of MMR if you were born in 1957 or later. You may also need a second dose.  Pneumococcal 13-valent conjugate (PCV13) vaccine. You may need this if you have certain conditions and were not previously vaccinated.  Pneumococcal polysaccharide (PPSV23) vaccine. You may need one or two doses if you smoke cigarettes or if you have certain conditions.  Meningococcal vaccine. You may need this if you have certain  conditions.  Hepatitis A vaccine. You may need this if you have certain conditions or if you travel or work in places where you may be exposed to hepatitis A.  Hepatitis B vaccine. You may need this if you have certain conditions or if you travel or work in places where you may be exposed to hepatitis B.  Haemophilus influenzae type b (Hib) vaccine. You may need this if you have certain conditions.  Talk to your health care provider about which screenings and vaccines you need and how often you need them. This information is not intended to replace advice given to you by your health care provider. Make sure you discuss any questions you have with your health care provider. Document Released: 10/29/2015 Document Revised: 06/21/2016 Document Reviewed: 08/03/2015 Elsevier Interactive Patient Education  2017 Miller K. Panosh M.D.

## 2017-08-17 ENCOUNTER — Ambulatory Visit (INDEPENDENT_AMBULATORY_CARE_PROVIDER_SITE_OTHER): Payer: 59 | Admitting: Internal Medicine

## 2017-08-17 ENCOUNTER — Encounter: Payer: Self-pay | Admitting: Internal Medicine

## 2017-08-17 VITALS — BP 124/82 | HR 85 | Temp 98.0°F | Ht 61.25 in | Wt 267.8 lb

## 2017-08-17 DIAGNOSIS — Z6841 Body Mass Index (BMI) 40.0 and over, adult: Secondary | ICD-10-CM | POA: Diagnosis not present

## 2017-08-17 DIAGNOSIS — R300 Dysuria: Secondary | ICD-10-CM | POA: Diagnosis not present

## 2017-08-17 DIAGNOSIS — Z0000 Encounter for general adult medical examination without abnormal findings: Secondary | ICD-10-CM | POA: Diagnosis not present

## 2017-08-17 DIAGNOSIS — R109 Unspecified abdominal pain: Secondary | ICD-10-CM | POA: Diagnosis not present

## 2017-08-17 DIAGNOSIS — Z1211 Encounter for screening for malignant neoplasm of colon: Secondary | ICD-10-CM

## 2017-08-17 DIAGNOSIS — Z01419 Encounter for gynecological examination (general) (routine) without abnormal findings: Secondary | ICD-10-CM | POA: Diagnosis not present

## 2017-08-17 LAB — POCT URINALYSIS DIPSTICK
Bilirubin, UA: NEGATIVE
Glucose, UA: NEGATIVE
KETONES UA: NEGATIVE
LEUKOCYTES UA: NEGATIVE
Nitrite, UA: NEGATIVE
PH UA: 6 (ref 5.0–8.0)
UROBILINOGEN UA: 1 U/dL

## 2017-08-17 MED ORDER — SULFAMETHOXAZOLE-TRIMETHOPRIM 800-160 MG PO TABS: 1.0000 | ORAL_TABLET | Freq: Two times a day (BID) | ORAL | 0 refills | Status: DC

## 2017-08-17 MED ORDER — PHENAZOPYRIDINE HCL 100 MG PO TABS: 100.0000 mg | ORAL_TABLET | Freq: Three times a day (TID) | ORAL | 0 refills | Status: DC | PRN

## 2017-08-17 NOTE — Patient Instructions (Addendum)
You will be contacted about referral for colonoscopy screening. Your symptoms are consistent with a bad urinary infection bladder infection. We will send an antibiotic and if not improved to reevaluate.  And   Continued work on diabetes control weight control.  And let us know how we can help.  Wt Readings from Last 3 Encounters:  08/17/17 267 lb 12.8 oz (121.5 kg)  03/07/17 266 lb 6.4 oz (120.8 kg)  01/22/17 271 lb (122.9 kg)   BP Readings from Last 3 Encounters:  08/17/17 124/82  05/06/17 129/66  03/07/17 120/70     Preventive Care 40-64 Years, Female Preventive care refers to lifestyle choices and visits with your health care provider that can promote health and wellness. What does preventive care include?  A yearly physical exam. This is also called an annual well check.  Dental exams once or twice a year.  Routine eye exams. Ask your health care provider how often you should have your eyes checked.  Personal lifestyle choices, including: ? Daily care of your teeth and gums. ? Regular physical activity. ? Eating a healthy diet. ? Avoiding tobacco and drug use. ? Limiting alcohol use. ? Practicing safe sex. ? Taking low-dose aspirin daily starting at age 81. ? Taking vitamin and mineral supplements as recommended by your health care provider. What happens during an annual well check? The services and screenings done by your health care provider during your annual well check will depend on your age, overall health, lifestyle risk factors, and family history of disease. Counseling Your health care provider may ask you questions about your:  Alcohol use.  Tobacco use.  Drug use.  Emotional well-being.  Home and relationship well-being.  Sexual activity.  Eating habits.  Work and work Statistician.  Method of birth control.  Menstrual cycle.  Pregnancy history.  Screening You may have the following tests or measurements:  Height, weight, and  BMI.  Blood pressure.  Lipid and cholesterol levels. These may be checked every 5 years, or more frequently if you are over 17 years old.  Skin check.  Lung cancer screening. You may have this screening every year starting at age 22 if you have a 30-pack-year history of smoking and currently smoke or have quit within the past 15 years.  Fecal occult blood test (FOBT) of the stool. You may have this test every year starting at age 38.  Flexible sigmoidoscopy or colonoscopy. You may have a sigmoidoscopy every 5 years or a colonoscopy every 10 years starting at age 62.  Hepatitis C blood test.  Hepatitis B blood test.  Sexually transmitted disease (STD) testing.  Diabetes screening. This is done by checking your blood sugar (glucose) after you have not eaten for a while (fasting). You may have this done every 1-3 years.  Mammogram. This may be done every 1-2 years. Talk to your health care provider about when you should start having regular mammograms. This may depend on whether you have a family history of breast cancer.  BRCA-related cancer screening. This may be done if you have a family history of breast, ovarian, tubal, or peritoneal cancers.  Pelvic exam and Pap test. This may be done every 3 years starting at age 60. Starting at age 43, this may be done every 5 years if you have a Pap test in combination with an HPV test.  Bone density scan. This is done to screen for osteoporosis. You may have this scan if you are at high risk for osteoporosis.  Discuss your test results, treatment options, and if necessary, the need for more tests with your health care provider. Vaccines Your health care provider may recommend certain vaccines, such as:  Influenza vaccine. This is recommended every year.  Tetanus, diphtheria, and acellular pertussis (Tdap, Td) vaccine. You may need a Td booster every 10 years.  Varicella vaccine. You may need this if you have not been vaccinated.  Zoster  vaccine. You may need this after age 71.  Measles, mumps, and rubella (MMR) vaccine. You may need at least one dose of MMR if you were born in 1957 or later. You may also need a second dose.  Pneumococcal 13-valent conjugate (PCV13) vaccine. You may need this if you have certain conditions and were not previously vaccinated.  Pneumococcal polysaccharide (PPSV23) vaccine. You may need one or two doses if you smoke cigarettes or if you have certain conditions.  Meningococcal vaccine. You may need this if you have certain conditions.  Hepatitis A vaccine. You may need this if you have certain conditions or if you travel or work in places where you may be exposed to hepatitis A.  Hepatitis B vaccine. You may need this if you have certain conditions or if you travel or work in places where you may be exposed to hepatitis B.  Haemophilus influenzae type b (Hib) vaccine. You may need this if you have certain conditions.  Talk to your health care provider about which screenings and vaccines you need and how often you need them. This information is not intended to replace advice given to you by your health care provider. Make sure you discuss any questions you have with your health care provider. Document Released: 10/29/2015 Document Revised: 06/21/2016 Document Reviewed: 08/03/2015 Elsevier Interactive Patient Education  2017 [employer].

## 2017-08-18 LAB — URINE CULTURE
MICRO NUMBER:: 81233017
Result:: NO GROWTH
SPECIMEN QUALITY: ADEQUATE

## 2017-08-20 ENCOUNTER — Encounter (HOSPITAL_COMMUNITY): Payer: Self-pay | Admitting: Emergency Medicine

## 2017-08-20 ENCOUNTER — Emergency Department (HOSPITAL_COMMUNITY)
Admission: EM | Admit: 2017-08-20 | Discharge: 2017-08-20 | Disposition: A | Discharge status: Home / Self Care | Payer: 59 | Attending: Emergency Medicine | Admitting: Emergency Medicine

## 2017-08-20 DIAGNOSIS — Z5321 Procedure and treatment not carried out due to patient leaving prior to being seen by health care provider: Secondary | ICD-10-CM | POA: Diagnosis not present

## 2017-08-20 DIAGNOSIS — R109 Unspecified abdominal pain: Secondary | ICD-10-CM | POA: Diagnosis not present

## 2017-08-20 HISTORY — DX: Cerebral infarction, unspecified: I63.9

## 2017-08-20 LAB — COMPREHENSIVE METABOLIC PANEL
ALBUMIN: 3.4 g/dL — AB (ref 3.5–5.0)
ALK PHOS: 78 U/L (ref 38–126)
ALT: 24 U/L (ref 14–54)
AST: 21 U/L (ref 15–41)
Anion gap: 8 (ref 5–15)
BILIRUBIN TOTAL: 0.2 mg/dL — AB (ref 0.3–1.2)
BUN: 14 mg/dL (ref 6–20)
CALCIUM: 9 mg/dL (ref 8.9–10.3)
CO2: 24 mmol/L (ref 22–32)
CREATININE: 0.9 mg/dL (ref 0.44–1.00)
Chloride: 107 mmol/L (ref 101–111)
GFR calc Af Amer: >60 mL/min (ref >60)
GLUCOSE: 104 mg/dL — AB (ref 65–99)
POTASSIUM: 4.1 mmol/L (ref 3.5–5.1)
Sodium: 139 mmol/L (ref 135–145)
TOTAL PROTEIN: 7.7 g/dL (ref 6.5–8.1)

## 2017-08-20 LAB — CBC
HCT: 35.3 % — ABNORMAL LOW (ref 36.0–46.0)
Hemoglobin: 11.5 g/dL — ABNORMAL LOW (ref 12.0–15.0)
MCH: 27.9 pg (ref 26.0–34.0)
MCHC: 32.6 g/dL (ref 30.0–36.0)
MCV: 85.7 fL (ref 78.0–100.0)
PLATELETS: 413 10*3/uL — AB (ref 150–400)
RBC: 4.12 MIL/uL (ref 3.87–5.11)
RDW: 15.7 % — AB (ref 11.5–15.5)
WBC: 11.1 10*3/uL — AB (ref 4.0–10.5)

## 2017-08-20 LAB — LIPASE, BLOOD: Lipase: 51 U/L (ref 11–51)

## 2017-08-20 NOTE — ED Triage Notes (Signed)
Patient c/o dysuria even on antibiotics and pyridium for UTi. Patient also having constipation and abd pain but denies n/v.

## 2017-09-14 ENCOUNTER — Ambulatory Visit: Payer: 59 | Admitting: Physician Assistant

## 2017-09-24 ENCOUNTER — Ambulatory Visit: Payer: 59 | Admitting: Physician Assistant

## 2017-10-01 ENCOUNTER — Telehealth: Payer: Self-pay | Admitting: Internal Medicine

## 2017-10-01 ENCOUNTER — Ambulatory Visit: Payer: Self-pay | Admitting: *Deleted

## 2017-10-01 NOTE — Telephone Encounter (Signed)
Dr Clent RidgesFry please advise if you can place a referral to see an Orthopedic Doctor for her leg pain.  Pt refused to schedule an OV with one of our MDs in Dr Rosezella FloridaPanosh's absence. Pt states that she is does not want to come in just to be told she will need to see a specialist.  Requesting that a referral be placed asap. Aware that Dr Fabian SharpPanosh is out until 10/08/17 and states that this absolutely cannot wait until next week.   In addition to message below,  Copied from CRM [id number]. Topic: Referral - Request >> Oct 01, 2017  4:13 PM Rudi CocoLathan, Latoya M, [place] wrote: CRM for notification. See Telephone encounter for: 10/01/17.  Reason for CRM: pt calling to request a referral for a doctor with ortho her left knee.  Pt. Said her knee is swollen and very painful. Pt asking that someone give her a call when able to get something done

## 2017-10-01 NOTE — Telephone Encounter (Signed)
Copied from CRM [phone]. Topic: Referral - Request >> Oct 01, 2017  4:13 PM Rudi CocoLathan, Monique Nelson M, [place] wrote: CRM for notification. See Telephone encounter for:   10/01/17.  Reason for CRM: pt calling to request a referral for a doctor with ortho her left knee. Pt. Said her knee is swollen and very painful. Pt asking that someone give her a call when able to get something done

## 2017-10-01 NOTE — Telephone Encounter (Signed)
See other 10/01/17 message Will close this message

## 2017-10-01 NOTE — Telephone Encounter (Signed)
   Reason for Disposition . Caused by overuse from recent vigorous activity (e.g.,  aerobics, jogging/running, physical work, prolonged walking, sports)  Answer Assessment - Initial Assessment Questions 1. LOCATION and RADIATION: "Where is the pain located?"      Left knee 2. QUALITY: "What does the pain feel like?"  (e.g., sharp, dull, aching, burning)     Dull 3. SEVERITY: "How bad is the pain?" "What does it keep you from doing?"   (Scale 1-10; or mild, moderate, severe)   -  MILD (1-3): doesn't interfere with normal activities    -  MODERATE (4-7): interferes with normal activities (e.g., work or school) or awakens from sleep, limping    -  SEVERE (8-10): excruciating pain, unable to do any normal activities, unable to walk     Moderate 4. ONSET: "When did the pain start?" "Does it come and go, or is it there all the time?"     Saturday morning 5. RECURRENT: "Have you had this pain before?" If so, ask: "When, and what happened then?"     No 6. SETTING: "Has there been any recent work, exercise or other activity that involved that part of the body?"      No 7. AGGRAVATING FACTORS: "What makes the knee pain worse?" (e.g., walking, climbing stairs, running)     Walking and standing 8. ASSOCIATED SYMPTOMS: "Is there any swelling or redness of the knee?"     No but it is swollen 9. OTHER SYMPTOMS: "Do you have any other symptoms?" (e.g., chest pain, difficulty breathing, fever, calf pain)     No 10. PREGNANCY: "Is there any chance you are pregnant?" "When was your last menstrual period?"       No  Protocols used: KNEE PAIN-A-AH  Pt stating she has been experiencing left knee pain and swelling and wants to know if she can be referred to an orthopedic doctor. Pt is complaining of soreness to the top and side of the knee, and also complains of pain with stretching or standing the knee.Pt does not want to come in for an appt, because she states she would rather just come in to be seen by  one doctor(orthopedic) vs being seen by primary doctor then going to orthopedic doctor.

## 2017-10-02 ENCOUNTER — Other Ambulatory Visit: Payer: Self-pay

## 2017-10-02 ENCOUNTER — Emergency Department (HOSPITAL_BASED_OUTPATIENT_CLINIC_OR_DEPARTMENT_OTHER): Payer: 59

## 2017-10-02 ENCOUNTER — Emergency Department (HOSPITAL_BASED_OUTPATIENT_CLINIC_OR_DEPARTMENT_OTHER)
Admission: EM | Admit: 2017-10-02 | Discharge: 2017-10-02 | Disposition: A | Discharge status: Home / Self Care | Payer: 59 | Attending: Emergency Medicine | Admitting: Emergency Medicine

## 2017-10-02 ENCOUNTER — Encounter (HOSPITAL_BASED_OUTPATIENT_CLINIC_OR_DEPARTMENT_OTHER): Payer: Self-pay | Admitting: Emergency Medicine

## 2017-10-02 DIAGNOSIS — Z7984 Long term (current) use of oral hypoglycemic drugs: Secondary | ICD-10-CM | POA: Diagnosis not present

## 2017-10-02 DIAGNOSIS — Z79899 Other long term (current) drug therapy: Secondary | ICD-10-CM | POA: Insufficient documentation

## 2017-10-02 DIAGNOSIS — M25562 Pain in left knee: Secondary | ICD-10-CM

## 2017-10-02 DIAGNOSIS — Z87891 Personal history of nicotine dependence: Secondary | ICD-10-CM | POA: Insufficient documentation

## 2017-10-02 DIAGNOSIS — E119 Type 2 diabetes mellitus without complications: Secondary | ICD-10-CM | POA: Diagnosis not present

## 2017-10-02 NOTE — ED Triage Notes (Addendum)
patient states the she woke up and had pain to her left knee. The patient states that she feels like her left knee is swollen and down into her calf - noted swelling from the posterior part of her knee down into her calf. Pulses positive - Homans positive

## 2017-10-02 NOTE — Telephone Encounter (Signed)
NO. I cannot refer her for a problem when I have never even met her. She needs an OV to get an idea of what is going on

## 2017-10-02 NOTE — Telephone Encounter (Signed)
Spoke with patient, aware that she will need an office visit for this issue and that she can be seen tomorrow by Dr Caryl NeverBurchette >> he has openings. Pt also made aware that she can go to an Ortho walk-in clinic if she still wants to see an Ortho versus coming in our office for an appt.  Pt states that she is going to call a few of the Ortho offices in the area to ask about walk-in clinics or see if she even needs a referral and will let us know if she is not able to be seen.  Nothing further needed.

## 2017-10-02 NOTE — ED Provider Notes (Signed)
[facility] EMERGENCY DEPARTMENT Provider Note   CSN: [id number] Arrival date & time: 10/02/17  1841     History   Chief Complaint Chief Complaint  Patient presents with  . Knee Pain    HPI Monique Nelson is a 51 y.o. female.  51 yo F with a chief complaint of left knee pain.  This been going on for the past couple days.  She denies any injury.  Denies fevers or chills.  Denies redness.  She feels that it hurts worse on the inside of the knee and into the posterior aspect.  No history of PE or DVT in the past.  Denies chest pain or shortness of breath.   The history is provided by the patient.  Knee Pain   This is a new problem. The current episode started yesterday. The problem occurs constantly. The problem has been gradually worsening. The pain is present in the left knee. The quality of the pain is described as aching. The pain is at a severity of 6/10. The pain is moderate. Pertinent negatives include no numbness, full range of motion and no stiffness. She has tried nothing for the symptoms. The treatment provided no relief.    Past Medical History:  Diagnosis Date  . Diabetes mellitus without complication ([place])   . Drug addiction ([place])    recovering  . Ex-smoker 2009  . History of fracture of wrist 17 years ago   has plate and pins right   . Hx of hysterectomy    bleeding and cysts  . Hx of varicella   . Stroke ([place])   . Substance abuse in remission ([place]) 13 years ago    cocaine heroiin  met   support via ADP  . TIA (transient ischemic attack)   . Vertigo    intermittent infrequent    Patient Active Problem List   Diagnosis Date Noted  . Diabetes mellitus without complication ([place]) 93/23/5573  . Solar dermatitis ? 05/05/2014  . Allergic conjunctivitis and rhinitis 12/23/2013  . Hypokalemia 12/23/2013  . Prediabetes 12/23/2013  . Family history of sleep apnea 11/17/2013  . OSA (obstructive sleep apnea) 11/17/2013  . Snoring 11/17/2013  . Hot  flushes, perimenopausal 11/17/2013  . Sleep difficulties 08/05/2013  . Bacterial vaginosis 08/05/2013  . Fasting hyperglycemia 01/10/2013  . Diarrhea 01/10/2013  . Abdominal pain, epigastric 01/10/2013  . Nausea with vomiting 01/10/2013  . FH: Crohn's disease 01/10/2013  . Headache(784.0) 09/10/2012  . Elevated blood pressure reading 09/10/2012  . Menopausal hot flushes 08/02/2012  . Visit for preventive health examination 08/02/2012  . Hx of hysterectomy   . Ex-smoker   . Substance abuse in remission ([place])   . Morbid obesity ([place]) 12/10/2007  . Allergic rhinitis 12/10/2007  . VERTIGO 12/10/2007    Past Surgical History:  Procedure Laterality Date  . ABDOMINAL HYSTERECTOMY    . ablastion    . rt hand surgery     17 years ago    OB History    No data available       Home Medications    Prior to Admission medications   Medication Sig Start Date End Date Taking? Authorizing Provider  albuterol (PROVENTIL HFA;VENTOLIN HFA) 108 (90 Base) MCG/ACT inhaler Inhale 2 puffs into the lungs every 6 (six) hours as needed for wheezing or shortness of breath. 03/07/17   Panosh, [name], MD  canagliflozin (INVOKANA) 100 MG TABS tablet Take 1 tablet (100 mg total) by mouth daily before breakfast. 05/06/17  Gareth Morgan, MD  cetirizine (ZYRTEC) 10 MG tablet Take 10 mg by mouth daily as needed for allergies.     [provider]  cyclobenzaprine (FLEXERIL) 10 MG tablet Take 1 tablet (10 mg total) by mouth 2 (two) times daily as needed for muscle spasms. 07/07/16   Dorie Rank, MD  dimenhyDRINATE (DRAMAMINE) 50 MG tablet Take 50 mg by mouth every 8 (eight) hours as needed for nausea.    [provider]  doxycycline (VIBRA-TABS) 100 MG tablet Take 1 tablet (100 mg total) by mouth 2 (two) times daily. 03/07/17   Panosh, [name], MD  gabapentin (NEURONTIN) 100 MG capsule Take 1 capsule (100 mg total) by mouth 2 (two) times daily. 01/22/17   Elayne Snare, MD  liraglutide (VICTOZA)  18 MG/3ML SOPN INJECT 1.8MG INTO THE SKIN EVERY DAY 01/22/17   Elayne Snare, MD  Lorcaserin HCl ER (BELVIQ XR) 20 MG TB24 Take 20 mg by mouth daily with breakfast. 04/16/17   Elayne Snare, MD  naproxen (NAPROSYN) 375 MG tablet Take 1 tablet (375 mg total) by mouth 2 (two) times daily. 07/07/16   Dorie Rank, MD  phenazopyridine (PYRIDIUM) 100 MG tablet Take 1 tablet (100 mg total) by mouth 3 (three) times daily as needed for pain. 08/17/17   Panosh, [name], MD  rosuvastatin (CRESTOR) 20 MG tablet Take 1 tablet (20 mg total) by mouth daily. 01/22/17   Elayne Snare, MD  sodium chloride (OCEAN) 0.65 % SOLN nasal spray Place 1 spray into both nostrils as needed for congestion. 02/06/16   Charlesetta Shanks, MD  sulfamethoxazole-trimethoprim (BACTRIM DS,SEPTRA DS) 800-160 MG tablet Take 1 tablet by mouth 2 (two) times daily. 08/17/17   Panosh, [name], MD  Tetrahydrozoline-Zn Sulfate (EYE DROPS ALLERGY RELIEF OP) Apply 2 drops to eye daily.    [provider]    Family History Family History  Problem Relation Age of Onset  . COPD Mother   . Diabetes Mother   . Hypertension Mother   . Sleep apnea Mother        and sister   . Thyroid disease Mother   . Chronic fatigue Mother   . Osteoarthritis Mother        knee replacement   . Glaucoma Father   . Other Father        prostate disease  . Alcohol abuse Unknown        drug parent    Social History Social History   Tobacco Use  . Smoking status: Former Smoker    Packs/day: 0.04    Years: 8.00    Pack years: 0.32    Types: Cigarettes    Start date: 10/21/2013  . Smokeless tobacco: Never Used  . Tobacco comment: off and on x 8 yrs  Substance Use Topics  . Alcohol use: No  . Drug use: Yes    Types: "Crack" cocaine, Heroin, Methamphetamines, Marijuana    Comment: History- 14 years clean-11/21/13     Allergies   Other   Review of Systems Review of Systems  Constitutional: Negative for chills and fever.  HENT: Negative for congestion and  rhinorrhea.   Eyes: Negative for redness and visual disturbance.  Respiratory: Negative for shortness of breath and wheezing.   Cardiovascular: Negative for chest pain and palpitations.  Gastrointestinal: Negative for nausea and vomiting.  Genitourinary: Negative for dysuria and urgency.  Musculoskeletal: Positive for arthralgias, gait problem and myalgias. Negative for stiffness.  Skin: Negative for pallor and wound.  Neurological:  Negative for dizziness, numbness and headaches.     Physical Exam Updated Vital Signs BP 127/73 (BP Location: Right Arm)   Pulse 88   Temp 99 F (37.2 C) (Oral)   Resp 20   Ht _0  (1.575 m)   Wt 123.3 kg (271 lb 13.2 oz)   LMP 12/15/2011   SpO2 99%   BMI 49.72 kg/m   Physical Exam  Constitutional: She is oriented to person, place, and time. She appears well-developed and well-nourished. No distress.  Morbidly obese  HENT:  Head: Normocephalic and atraumatic.  Eyes: EOM are normal. Pupils are equal, round, and reactive to light.  Neck: Normal range of motion. Neck supple.  Cardiovascular: Normal rate and regular rhythm. Exam reveals no gallop and no friction rub.  No murmur heard. Pulmonary/Chest: Effort normal. She has no wheezes. She has no rales.  Abdominal: Soft. She exhibits no distension. There is no tenderness.  Musculoskeletal: She exhibits tenderness. She exhibits no edema.  Tender to palpation about the left medial joint space.  No ligamentous laxity.  Pulse motor and sensation is intact distally.  Neurological: She is alert and oriented to person, place, and time.  Skin: Skin is warm and dry. She is not diaphoretic.  Psychiatric: She has a normal mood and affect. Her behavior is normal.  Nursing note and vitals reviewed.    ED Treatments / Results  Labs (all labs ordered are listed, but only abnormal results are displayed) Labs Reviewed - No data to display  EKG  EKG Interpretation None       Radiology US Venous Img  Lower Unilateral Left  Result Date: 10/02/2017 CLINICAL DATA:  Left knee pain, swelling EXAM: LEFT LOWER EXTREMITY VENOUS DOPPLER ULTRASOUND TECHNIQUE: Gray-scale sonography with graded compression, as well as color Doppler and duplex ultrasound were performed to evaluate the lower extremity deep venous systems from the level of the common femoral vein and including the common femoral, femoral, profunda femoral, popliteal and calf veins including the posterior tibial, peroneal and gastrocnemius veins when visible. The superficial great saphenous vein was also interrogated. Spectral Doppler was utilized to evaluate flow at rest and with distal augmentation maneuvers in the common femoral, femoral and popliteal veins. COMPARISON:  None. FINDINGS: Contralateral Common Femoral Vein: Respiratory phasicity is normal and symmetric with the symptomatic side. No evidence of thrombus. Normal compressibility. Common Femoral Vein: No evidence of thrombus. Normal compressibility, respiratory phasicity and response to augmentation. Saphenofemoral Junction: No evidence of thrombus. Normal compressibility and flow on color Doppler imaging. Profunda Femoral Vein: No evidence of thrombus. Normal compressibility and flow on color Doppler imaging. Femoral Vein: No evidence of thrombus. Normal compressibility, respiratory phasicity and response to augmentation. Popliteal Vein: No evidence of thrombus. Normal compressibility, respiratory phasicity and response to augmentation. Calf Veins: No evidence of thrombus. Normal compressibility and flow on color Doppler imaging. Superficial Great Saphenous Vein: No evidence of thrombus. Normal compressibility. Venous Reflux:  None. Other Findings:  None. IMPRESSION: No evidence of deep venous thrombosis. Electronically Signed   By: Rolm Baptise M.D.   On: 10/02/2017 20:26   Dg Knee Complete 4 Views Left  Result Date: 10/02/2017 CLINICAL DATA:  Left knee pain for 4 days EXAM: LEFT KNEE -  COMPLETE 4+ VIEW COMPARISON:  None. FINDINGS: No evidence of fracture, dislocation, or joint effusion. Joint spaces are maintained. Possible knee effusion. IMPRESSION: 1. No acute osseous abnormality 2. Possible knee effusion. Electronically Signed   By: Donavan Foil M.D.   On:  10/02/2017 20:17    Procedures Procedures (including critical care time)  Medications Ordered in ED Medications - No data to display   Initial Impression / Assessment and Plan / ED Course  I have reviewed the triage vital signs and the nursing notes.  Pertinent labs & imaging results that were available during my care of the patient were reviewed by me and considered in my medical decision making (see chart for details).     51 yo F with a chief complaint of left knee pain.  She denies injury.  X-ray and ultrasound were performed in triage and unremarkable.  Placed in a knee sleeve.  PCP follow-up.  Final Clinical Impressions(s) / ED Diagnoses   Final diagnoses:  Acute pain of left knee    ED Discharge Orders    None       Deno Etienne, DO 10/02/17 2322

## 2017-10-03 ENCOUNTER — Ambulatory Visit: Payer: 59 | Admitting: Physician Assistant

## 2017-10-24 ENCOUNTER — Encounter: Payer: Self-pay | Admitting: Internal Medicine

## 2017-10-24 NOTE — Progress Notes (Signed)
Chief Complaint  Patient presents with  . Knee Pain    Left Knee pain for 4 weeks+. Pt seen in ED and had xray and MRI.     HPI: Monique Nelson 52 y.o. come in for  New problem sda  Onset   Acute on am   One am  With excruciating pain  But no fever known injury  and then went to ed  X rqay and  [place]   Given crutches and taking nsaids .  No help  Used moms topical once with some help   Still limping and  Hurts ant medial and feels like  Full inside her knee    And making noises but no licking  ? Feels unstable some  But  Not wegiht bearing fully .   Did not have MRI but [place]  . ROS: See pertinent positives and negatives per HPI. No hx of same    No fever  No fall   Past Medical History:  Diagnosis Date  . Diabetes mellitus without complication ([place])   . Drug addiction ([place])    recovering  . Ex-smoker 2009  . History of fracture of wrist 17 years ago   has plate and pins right   . Hx of hysterectomy    bleeding and cysts  . Hx of varicella   . Stroke ([place])   . Substance abuse in remission ([place]) 13 years ago    cocaine heroiin  met   support via ADP  . TIA (transient ischemic attack)   . Vertigo    intermittent infrequent    Family History  Problem Relation Age of Onset  . COPD Mother   . Diabetes Mother   . Hypertension Mother   . Sleep apnea Mother        and sister   . Thyroid disease Mother   . Chronic fatigue Mother   . Osteoarthritis Mother        knee replacement   . Glaucoma Father   . Other Father        prostate disease  . Alcohol abuse Unknown        drug parent    Social History   Socioeconomic History  . Marital status: Single    Spouse name: None  . Number of children: 1  . Years of education: None  . Highest education level: None  Social Needs  . Financial resource strain: None  . Food insecurity - worry: None  . Food insecurity - inability: None  . Transportation needs - medical: None  . Transportation needs - non-medical: None  Occupational  History    Employer: Theme park manager  Tobacco Use  . Smoking status: Former Smoker    Packs/day: 0.04    Years: 8.00    Pack years: 0.32    Types: Cigarettes    Start date: 10/21/2013  . Smokeless tobacco: Never Used  . Tobacco comment: off and on x 8 yrs  Substance and Sexual Activity  . Alcohol use: No  . Drug use: Yes    Types: "Crack" cocaine, Heroin, Methamphetamines, Marijuana    Comment: History- 14 years clean-11/21/13  . Sexual activity: None  Other Topics Concern  . 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 albuterol (PROVENTIL HFA;VENTOLIN HFA) 108 (90 Base) MCG/ACT inhaler Inhale 2 puffs into the lungs every 6 (six) hours as needed for wheezing or shortness of breath. 1 Inhaler 1  . canagliflozin (INVOKANA) 100 MG TABS tablet Take 1 tablet (100 mg total) by mouth daily before breakfast. 30 tablet 0  . cetirizine (ZYRTEC) 10 MG tablet Take 10 mg by mouth daily as needed for allergies.     . cyclobenzaprine (FLEXERIL) 10 MG tablet Take 1 tablet (10 mg total) by mouth 2 (two) times daily as needed for muscle spasms. 20 tablet 0  . dimenhyDRINATE (DRAMAMINE) 50 MG tablet Take 50 mg by mouth every 8 (eight) hours as needed for nausea.    Marland Kitchen doxycycline (VIBRA-TABS) 100 MG tablet Take 1 tablet (100 mg total) by mouth 2 (two) times daily. 14 tablet 0  . gabapentin (NEURONTIN) 100 MG capsule Take 1 capsule (100 mg total) by mouth 2 (two) times daily. 60 capsule 3  . liraglutide (VICTOZA) 18 MG/3ML SOPN INJECT 1.8MG INTO THE SKIN EVERY DAY 6 mL 3  . Lorcaserin HCl ER (BELVIQ XR) 20 MG TB24 Take 20 mg by mouth daily with breakfast. 30 tablet 0  . naproxen (NAPROSYN) 375 MG tablet Take 1 tablet  (375 mg total) by mouth 2 (two) times daily. 20 tablet 0  . phenazopyridine (PYRIDIUM) 100 MG tablet Take 1 tablet (100 mg total) by mouth 3 (three) times daily as needed for pain. 20 tablet 0  . rosuvastatin (CRESTOR) 20 MG tablet Take 1 tablet (20 mg total) by mouth daily. 30 tablet 3  . sodium chloride (OCEAN) 0.65 % SOLN nasal spray Place 1 spray into both nostrils as needed for congestion. 1 Bottle 0  . sulfamethoxazole-trimethoprim (BACTRIM DS,SEPTRA DS) 800-160 MG tablet Take 1 tablet by mouth 2 (two) times daily. 10 tablet 0  . Tetrahydrozoline-Zn Sulfate (EYE DROPS ALLERGY RELIEF OP) Apply 2 drops to eye daily.     Facility-Administered Medications Prior to Visit  Medication Dose Route Frequency Provider Last Rate Last Dose  . ipratropium-albuterol (DUONEB) 0.5-2.5 (3) MG/3ML nebulizer solution 3 mL  3 mL Nebulization Q6H Panosh, [name], MD         EXAM:  BP 132/82 (BP Location: Right Wrist, Patient Position: Sitting, Cuff Size: Normal)   Pulse 84   Temp 97.7 F (36.5 C) (Oral)   Wt 274 lb 3.2 oz (124.4 kg)   LMP 12/15/2011   BMI 50.15 kg/m   Body mass index is 50.15 kg/m.  GENERAL: vitals reviewed and listed above, alert, oriented, appears well hydrated and in no acute distress HEENT: atraumatic, conjunctiva  clear, no obvious abnormalities on inspection of external nose and earsMS:   Limping   Left knee   No redness  mild anteromed swelling but tender  through out    rom limited buy pain  hard to evaluate stability cause of size  And pain PSYCH: pleasant and cooperative, no obvious depression or anxiety  BP Readings from Last 3 Encounters:  10/25/17 132/82  10/02/17 127/73  08/17/17 124/82    ASSESSMENT AND PLAN:  Discussed the following assessment and plan:  Acute pain of left knee - Plan: Ambulatory referral to Orthopedic Surgery  Swelling of joint of left knee - Plan: Ambulatory referral to Orthopedic Surgery Some swelling and tenderness  Anserine area  adnt  hrough out  Consider but   Not typical gout   Acts like  Internal derangement but no injury or cause  X ray shows no arthritis changes . No evidence of infection  .  Topical nsaid and referral to ortho  Has failed limited weigh tbearing and  Oral antiinflammatories  At this time  -Patient advised to return or notify health care team  if  new concerns arise.  Patient Instructions  Will send in topical voltaren   Topical for now  And then  ortho referral .   ? If cartilage problem .   Internal derangement  ? Need for MRI .            [name]. Panosh M.D.

## 2017-10-25 ENCOUNTER — Telehealth: Payer: Self-pay | Admitting: Internal Medicine

## 2017-10-25 ENCOUNTER — Encounter: Payer: Self-pay | Admitting: Internal Medicine

## 2017-10-25 ENCOUNTER — Ambulatory Visit (INDEPENDENT_AMBULATORY_CARE_PROVIDER_SITE_OTHER): Payer: 59 | Admitting: Internal Medicine

## 2017-10-25 VITALS — BP 132/82 | HR 84 | Temp 97.7°F | Wt 274.2 lb

## 2017-10-25 DIAGNOSIS — M25462 Effusion, left knee: Secondary | ICD-10-CM

## 2017-10-25 DIAGNOSIS — M25562 Pain in left knee: Secondary | ICD-10-CM | POA: Diagnosis not present

## 2017-10-25 MED ORDER — DICLOFENAC SODIUM 1 % TD GEL: 4.0000 g | Freq: Four times a day (QID) | TRANSDERMAL | 1 refills | Status: DC

## 2017-10-25 NOTE — Telephone Encounter (Deleted)
Copied from CRM [phone]. Topic: Quick Communication - Rx Refill/Question >> Oct 25, 2017  2:51 PM Stephannie LiSimmons, Girtie Wiersma L, NT wrote: Medication: dolvoltarin Has the patient contacted their pharmacy? {yes  (Agent: If no, request that the patient contact the pharmacy for the refill.) Preferred Pharmacy (with phone number or street name): *** Agent: Please be advised that RX refills may take up to 3 business days. We ask that you follow-up with your pharmacy. Patient went to pick up and medication was 300.00 and would like something less costly   Or over the counter

## 2017-10-25 NOTE — Telephone Encounter (Signed)
Copied from CRM [phone]. Topic: Inquiry >> Oct 25, 2017  2:57 PM Stephannie LiSimmons, Cordarrius Coad L, NT wrote: Reason for JYN:[id number]:Patient called and said the prescription for voltaren was too expensive  $300.00,please call her something less expensive or over the counter ,thanks

## 2017-10-25 NOTE — Patient Instructions (Addendum)
Will send in topical voltaren   Topical for now  And then  ortho referral .   ? If cartilage problem .   Internal derangement  ? Need for MRI .

## 2017-10-25 NOTE — Telephone Encounter (Deleted)
Copied from CRM [phone]. Topic: Quick Communication - Rx Refill/Question >> Oct 25, 2017  2:51 PM Monique Nelson, Monique Nelson L, NT wrote: Medication: dolvoltarin Has the patient contacted their pharmacy? {yes  (Agent: If no, request that the patient contact the pharmacy for the refill.) Preferred Pharmacy (with phone number or street name): *** Agent: Please be advised that RX refills may take up to 3 business days. We ask that you follow-up with your pharmacy. Patient went to pick up and medication was 300.00 and would like something less costly

## 2017-10-26 NOTE — Telephone Encounter (Signed)
Not sure if pennsaid which is a solution is less expensive   Please ask her pharmacist   otherwise  Trying aspercreme

## 2017-10-26 NOTE — Telephone Encounter (Signed)
Please advise Dr Panosh, thanks.   

## 2017-10-29 ENCOUNTER — Encounter (INDEPENDENT_AMBULATORY_CARE_PROVIDER_SITE_OTHER): Payer: Self-pay | Admitting: Orthopaedic Surgery

## 2017-10-29 ENCOUNTER — Ambulatory Visit (INDEPENDENT_AMBULATORY_CARE_PROVIDER_SITE_OTHER): Payer: 59 | Admitting: Orthopaedic Surgery

## 2017-10-29 DIAGNOSIS — M25562 Pain in left knee: Secondary | ICD-10-CM

## 2017-10-29 NOTE — Progress Notes (Signed)
Mri

## 2017-10-29 NOTE — Telephone Encounter (Signed)
LM x 1 

## 2017-10-29 NOTE — Progress Notes (Signed)
 Office Visit Note   Patient: Monique Nelson           Date of Birth: 01/26/1966           MRN: [id number] Visit Date: 10/29/2017              Requested by: Panosh, Wanda K, MD [address] [place], [place] [address] PCP: Panosh, Wanda K, MD   Assessment & Plan: Visit Diagnoses:  1. Acute pain of left knee     Plan: Impression is left knee medial meniscus tear.  Due to the severity of pain on clinical exam when palpating the medial meniscus, I feel it is appropriate to proceed with an MRI of her left knee to assess this.  She will follow-up with us once this is completed.  If there is not a tear I have discussed proceeding with a cortisone injection.  Follow-Up Instructions: Return for after MRI to discuss results.   Orders:  Orders Placed This Encounter  Procedures  . MR Knee Left w/o contrast   No orders of the defined types were placed in this encounter.     Procedures: No procedures performed   Clinical Data: No additional findings.   Subjective: Chief Complaint  Patient presents with  . Left Knee - Pain    HPI Monique Nelson is a pleasant 52-year-old female who presents to our clinic today with left knee pain.  This began approximately 1 month ago with no known injury or change in activity.  All of her pain is medial aspect.  No anterior thigh/: Pain no posterior leg pain.  She describes this as sharp and shooting in nature worse when she is turning in the bed.  She also occasionally feels that her knee needs to buckle.  She has tried Aleve with minimal relief of symptoms.  No radicular symptoms noted.  No previous cortisone injection.  No previous injury to either knee.  Of note she was seen in the ED about a month ago where x-rays and venous Doppler ultrasounds were obtained.  X-rays showed minimal osteoarthritis.  No fracture/dislocation.  Ultrasound was negative for DVT.  She comes in today for follow-up.  Review of Systems as detailed in HPI.  All others  reviewed and are negative.   Objective: Vital Signs: LMP 12/15/2011   Physical Exam well-developed well-nourished female.  No acute distress.  Ortho Exam examination of her left knee reveals range of motion from 0-110 degrees.  Minimal patellofemoral crepitus.  Marked tenderness medial joint line over the medial meniscus with positive medial McMurray.  Ligaments are stable.  She is neurovascular intact distally.  Specialty Comments:  No specialty comments available.  Imaging: 4 views of the left knee reviewed by me in canopy, reveal minimal tricompartmental osteoarthritis.  No acute fracture.   PMFS History: Patient Active Problem List   Diagnosis Date Noted  . Diabetes mellitus without complication (HCC) 07/07/2015  . Solar dermatitis ? 05/05/2014  . Allergic conjunctivitis and rhinitis 12/23/2013  . Hypokalemia 12/23/2013  . Prediabetes 12/23/2013  . Family history of sleep apnea 11/17/2013  . OSA (obstructive sleep apnea) 11/17/2013  . Snoring 11/17/2013  . Hot flushes, perimenopausal 11/17/2013  . Sleep difficulties 08/05/2013  . Bacterial vaginosis 08/05/2013  . Fasting hyperglycemia 01/10/2013  . Diarrhea 01/10/2013  . Abdominal pain, epigastric 01/10/2013  . Nausea with vomiting 01/10/2013  . FH: Crohn's disease 01/10/2013  . Headache(784.0) 09/10/2012  . Elevated blood pressure reading 09/10/2012  . Menopausal hot flushes 08/02/2012  .   Visit for preventive health examination 08/02/2012  . Hx of hysterectomy   . Ex-smoker   . Substance abuse in remission ([place])   . Morbid obesity ([place]) 12/10/2007  . Allergic rhinitis 12/10/2007  . VERTIGO 12/10/2007   Past Medical History:  Diagnosis Date  . Diabetes mellitus without complication ([place])   . Drug addiction ([place])    recovering  . Ex-smoker 2009  . History of fracture of wrist 17 years ago   has plate and pins right   . Hx of hysterectomy    bleeding and cysts  . Hx of varicella   . Stroke ([place])   .  Substance abuse in remission ([place]) 13 years ago    cocaine heroiin  met   support via ADP  . TIA (transient ischemic attack)   . Vertigo    intermittent infrequent    Family History  Problem Relation Age of Onset  . COPD Mother   . Diabetes Mother   . Hypertension Mother   . Sleep apnea Mother        and sister   . Thyroid disease Mother   . Chronic fatigue Mother   . Osteoarthritis Mother        knee replacement   . Glaucoma Father   . Other Father        prostate disease  . Alcohol abuse Unknown        drug parent    Past Surgical History:  Procedure Laterality Date  . ABDOMINAL HYSTERECTOMY    . ablastion    . rt hand surgery     17 years ago   Social History   Occupational History    Employer: [employer]  Tobacco Use  . Smoking status: Former Smoker    Packs/day: 0.04    Years: 8.00    Pack years: 0.32    Types: Cigarettes    Start date: 10/21/2013  . Smokeless tobacco: Never Used  . Tobacco comment: off and on x 8 yrs  Substance and Sexual Activity  . Alcohol use: No  . Drug use: Yes    Types: "Crack" cocaine, Heroin, Methamphetamines, Marijuana    Comment: History- 14 years clean-11/21/13  . Sexual activity: Not on file

## 2017-10-31 ENCOUNTER — Ambulatory Visit
Admission: RE | Admit: 2017-10-31 | Discharge: 2017-10-31 | Disposition: A | Discharge status: Home / Self Care | Payer: 59 | Source: Ambulatory Visit | Attending: Orthopaedic Surgery | Admitting: Orthopaedic Surgery

## 2017-10-31 DIAGNOSIS — M25562 Pain in left knee: Secondary | ICD-10-CM

## 2017-10-31 NOTE — Progress Notes (Signed)
MRI

## 2017-11-01 NOTE — Progress Notes (Signed)
Talked with patient, appt.scheduled with Dr. Roda ShuttersXu to discuss MRI results.

## 2017-11-01 NOTE — Telephone Encounter (Signed)
LM x 2

## 2017-11-01 NOTE — Progress Notes (Signed)
Can you have her come in to discuss mri results

## 2017-11-05 ENCOUNTER — Emergency Department (HOSPITAL_COMMUNITY)
Admission: EM | Admit: 2017-11-05 | Discharge: 2017-11-05 | Disposition: A | Discharge status: Home / Self Care | Payer: 59 | Attending: Emergency Medicine | Admitting: Emergency Medicine

## 2017-11-05 ENCOUNTER — Emergency Department (HOSPITAL_COMMUNITY): Payer: 59

## 2017-11-05 ENCOUNTER — Encounter (HOSPITAL_COMMUNITY): Payer: Self-pay | Admitting: Emergency Medicine

## 2017-11-05 ENCOUNTER — Other Ambulatory Visit: Payer: Self-pay

## 2017-11-05 DIAGNOSIS — E119 Type 2 diabetes mellitus without complications: Secondary | ICD-10-CM | POA: Insufficient documentation

## 2017-11-05 DIAGNOSIS — Y92009 Unspecified place in unspecified non-institutional (private) residence as the place of occurrence of the external cause: Secondary | ICD-10-CM | POA: Diagnosis not present

## 2017-11-05 DIAGNOSIS — Z7984 Long term (current) use of oral hypoglycemic drugs: Secondary | ICD-10-CM | POA: Diagnosis not present

## 2017-11-05 DIAGNOSIS — M795 Residual foreign body in soft tissue: Secondary | ICD-10-CM

## 2017-11-05 DIAGNOSIS — Y999 Unspecified external cause status: Secondary | ICD-10-CM | POA: Insufficient documentation

## 2017-11-05 DIAGNOSIS — S91141A Puncture wound with foreign body of right great toe without damage to nail, initial encounter: Secondary | ICD-10-CM | POA: Diagnosis not present

## 2017-11-05 DIAGNOSIS — Z23 Encounter for immunization: Secondary | ICD-10-CM | POA: Diagnosis not present

## 2017-11-05 DIAGNOSIS — W458XXA Other foreign body or object entering through skin, initial encounter: Secondary | ICD-10-CM | POA: Insufficient documentation

## 2017-11-05 DIAGNOSIS — Y939 Activity, unspecified: Secondary | ICD-10-CM | POA: Diagnosis not present

## 2017-11-05 DIAGNOSIS — Z79899 Other long term (current) drug therapy: Secondary | ICD-10-CM | POA: Diagnosis not present

## 2017-11-05 DIAGNOSIS — Z87891 Personal history of nicotine dependence: Secondary | ICD-10-CM | POA: Diagnosis not present

## 2017-11-05 MED ORDER — CEPHALEXIN 500 MG PO CAPS: 500.0000 mg | ORAL_CAPSULE | Freq: Three times a day (TID) | ORAL | 0 refills | Status: DC

## 2017-11-05 MED ORDER — CEPHALEXIN 500 MG PO CAPS
500.0000 mg | ORAL_CAPSULE | Freq: Once | ORAL | Status: AC
  Administered 2017-11-05: 500 mg via ORAL
  Filled 2017-11-05: qty 1

## 2017-11-05 MED ORDER — LIDOCAINE-EPINEPHRINE (PF) 2 %-1:200000 IJ SOLN
10.0000 mL | Freq: Once | INTRAMUSCULAR | Status: AC
  Administered 2017-11-05: 10 mL
  Filled 2017-11-05: qty 20

## 2017-11-05 MED ORDER — NAPROXEN 375 MG PO TABS: 375.0000 mg | ORAL_TABLET | Freq: Two times a day (BID) | ORAL | 0 refills | Status: AC

## 2017-11-05 MED ORDER — TETANUS-DIPHTH-ACELL PERTUSSIS 5-2.5-18.5 LF-MCG/0.5 IM SUSP
0.5000 mL | Freq: Once | INTRAMUSCULAR | Status: AC
  Administered 2017-11-05: 0.5 mL via INTRAMUSCULAR
  Filled 2017-11-05: qty 0.5

## 2017-11-05 NOTE — ED Notes (Signed)
AVS explained in detail. Tetanus shot given with yellow information sheet. Wound cleansed with betadine and bacitracin applied with Kerlex dressing over dry gauze secured with paper tape. Pt has follow up with orthopedic surgeon tomorrow. Knows to always wear shoes at home and to keep area cleansed and covered until appointment. Knows to take antibiotic until completion and take naproxen as needed. No other c/c at this time. Ambulatory with steady gait upon discharge.

## 2017-11-05 NOTE — ED Triage Notes (Signed)
Pt verbalizes fixing a coat; stepped on needle and it broke; "half of needle still in my big toe." Puncture mark noted to right great toe.

## 2017-11-05 NOTE — ED Provider Notes (Signed)
[facility] DEPT Provider Note   CSN: [id number] Arrival date & time: 11/05/17  1025     History   Chief Complaint Chief Complaint  Patient presents with  . Foot Pain    HPI Monique Nelson is a 52 y.o. female with a hx of DM presents to the ED with concern for FB to R great toe that occurred at 10:00 this AM. Patient states that she stepped onto a sewing needle which broke off with part of the needle remaining in her foot. States she is having pain to the R great toe that is a 12/10 in severity, worse with movement of the foot. Describes as throbbing. Denies numbness or weakness.   HPI  Past Medical History:  Diagnosis Date  . Diabetes mellitus without complication ([place])   . Drug addiction ([place])    recovering  . Ex-smoker 2009  . History of fracture of wrist 17 years ago   has plate and pins right   . Hx of hysterectomy    bleeding and cysts  . Hx of varicella   . Stroke ([place])   . Substance abuse in remission ([place]) 13 years ago    cocaine heroiin  met   support via ADP  . TIA (transient ischemic attack)   . Vertigo    intermittent infrequent    Patient Active Problem List   Diagnosis Date Noted  . Diabetes mellitus without complication ([place]) 37/07/6268  . Solar dermatitis ? 05/05/2014  . Allergic conjunctivitis and rhinitis 12/23/2013  . Hypokalemia 12/23/2013  . Prediabetes 12/23/2013  . Family history of sleep apnea 11/17/2013  . OSA (obstructive sleep apnea) 11/17/2013  . Snoring 11/17/2013  . Hot flushes, perimenopausal 11/17/2013  . Sleep difficulties 08/05/2013  . Bacterial vaginosis 08/05/2013  . Fasting hyperglycemia 01/10/2013  . Diarrhea 01/10/2013  . Abdominal pain, epigastric 01/10/2013  . Nausea with vomiting 01/10/2013  . FH: Crohn's disease 01/10/2013  . Headache(784.0) 09/10/2012  . Elevated blood pressure reading 09/10/2012  . Menopausal hot flushes 08/02/2012  . Visit for preventive health examination  08/02/2012  . Hx of hysterectomy   . Ex-smoker   . Substance abuse in remission ([place])   . Morbid obesity ([place]) 12/10/2007  . Allergic rhinitis 12/10/2007  . VERTIGO 12/10/2007    Past Surgical History:  Procedure Laterality Date  . ABDOMINAL HYSTERECTOMY    . ablastion    . rt hand surgery     17 years ago    OB History    No data available       Home Medications    Prior to Admission medications   Medication Sig Start Date End Date Taking? Authorizing Provider  albuterol (PROVENTIL HFA;VENTOLIN HFA) 108 (90 Base) MCG/ACT inhaler Inhale 2 puffs into the lungs every 6 (six) hours as needed for wheezing or shortness of breath. 03/07/17   Panosh, [name], MD  canagliflozin (INVOKANA) 100 MG TABS tablet Take 1 tablet (100 mg total) by mouth daily before breakfast. 05/06/17   Gareth Morgan, MD  cetirizine (ZYRTEC) 10 MG tablet Take 10 mg by mouth daily as needed for allergies.     [provider]  cyclobenzaprine (FLEXERIL) 10 MG tablet Take 1 tablet (10 mg total) by mouth 2 (two) times daily as needed for muscle spasms. Patient not taking: Reported on 10/29/2017 07/07/16   Dorie Rank, MD  diclofenac sodium (VOLTAREN) 1 % GEL Apply 4 g topically 4 (four) times daily. As needed for knee pain Patient  not taking: Reported on 10/29/2017 10/25/17   Panosh, [name], MD  dimenhyDRINATE (DRAMAMINE) 50 MG tablet Take 50 mg by mouth every 8 (eight) hours as needed for nausea.    [provider]  doxycycline (VIBRA-TABS) 100 MG tablet Take 1 tablet (100 mg total) by mouth 2 (two) times daily. 03/07/17   Panosh, [name], MD  gabapentin (NEURONTIN) 100 MG capsule Take 1 capsule (100 mg total) by mouth 2 (two) times daily. 01/22/17   Elayne Snare, MD  liraglutide Donna Bernard) 18 MG/3ML SOPN INJECT 1.'8MG'$  INTO THE SKIN EVERY DAY 01/22/17   Elayne Snare, MD  Lorcaserin HCl ER (BELVIQ XR) 20 MG TB24 Take 20 mg by mouth daily with breakfast. 04/16/17   Elayne Snare, MD  naproxen (NAPROSYN) 375 MG  tablet Take 1 tablet (375 mg total) by mouth 2 (two) times daily. 07/07/16   Dorie Rank, MD  phenazopyridine (PYRIDIUM) 100 MG tablet Take 1 tablet (100 mg total) by mouth 3 (three) times daily as needed for pain. 08/17/17   Panosh, [name], MD  rosuvastatin (CRESTOR) 20 MG tablet Take 1 tablet (20 mg total) by mouth daily. 01/22/17   Elayne Snare, MD  sodium chloride (OCEAN) 0.65 % SOLN nasal spray Place 1 spray into both nostrils as needed for congestion. 02/06/16   Charlesetta Shanks, MD  sulfamethoxazole-trimethoprim (BACTRIM DS,SEPTRA DS) 800-160 MG tablet Take 1 tablet by mouth 2 (two) times daily. Patient not taking: Reported on 10/29/2017 08/17/17   Panosh, [name], MD  Tetrahydrozoline-Zn Sulfate (EYE DROPS ALLERGY RELIEF OP) Apply 2 drops to eye daily.    [provider]    Family History Family History  Problem Relation Age of Onset  . COPD Mother   . Diabetes Mother   . Hypertension Mother   . Sleep apnea Mother        and sister   . Thyroid disease Mother   . Chronic fatigue Mother   . Osteoarthritis Mother        knee replacement   . Glaucoma Father   . Other Father        prostate disease  . Alcohol abuse Unknown        drug parent    Social History Social History   Tobacco Use  . Smoking status: Former Smoker    Packs/day: 0.04    Years: 8.00    Pack years: 0.32    Types: Cigarettes    Start date: 10/21/2013  . Smokeless tobacco: Never Used  . Tobacco comment: off and on x 8 yrs  Substance Use Topics  . Alcohol use: No  . Drug use: Yes    Types: "Crack" cocaine, Heroin, Methamphetamines, Marijuana    Comment: History- 14 years clean-11/21/13     Allergies   Other   Review of Systems Review of Systems  Constitutional: Negative for chills and fever.  Musculoskeletal:       Positive for R great toe pain and R great toe FB  Neurological: Negative for weakness and numbness.     Physical Exam Updated Vital Signs BP (!) 144/62 (BP Location: Right Arm)    Pulse 99   Temp 98.1 F (36.7 C) (Oral)   Resp 18   LMP 12/15/2011   SpO2 99%   Physical Exam  Constitutional: She appears well-developed and well-nourished. No distress.  HENT:  Head: Normocephalic and atraumatic.  Eyes: Conjunctivae are normal. Right eye exhibits no discharge. Left eye exhibits no discharge.  Cardiovascular:  Pulses:  Posterior tibial pulses are 2+ on the right side, and 2+ on the left side.  Musculoskeletal:  Lower Extremities: Patient is able to move all digits. Diffusely tender to palpation to R great toe extending proximally to medial aspect of the forefoot.   Neurological: She is alert.  Clear speech. Sensation grossly intact to bilateral lower extremities.  Patient is able to move all digits.  5 out of 5 strength with plantar and dorsiflexion bilaterally.  Skin: Capillary refill takes less than 2 seconds.  Small puncture wound to the medial aspect of the base of the R great toe. NVI distal to area.   Psychiatric: She has a normal mood and affect. Her behavior is normal. Thought content normal.  Nursing note and vitals reviewed.   ED Treatments / Results  Labs (all labs ordered are listed, but only abnormal results are displayed) Labs Reviewed - No data to display  EKG  EKG Interpretation None       Radiology Dg Foot Complete Right  Result Date: 11/05/2017 CLINICAL DATA:  Stepped on a sowing needle. Evaluate for foreign body. EXAM: RIGHT FOOT COMPLETE - 3+ VIEW COMPARISON:  Right foot radiographs-04/08/2016 FINDINGS: There is an approximately 1.8 cm linear radiopaque foreign body, imbedded within the tissues about the plantar aspect of the first digit of the great toe. No associated fracture or subcutaneous emphysema. Mild hallux valgus deformity with associated mild degenerative change of the first MTP joint. Remaining joint spaces appear preserved. No erosions. Small plantar calcaneal spur. IMPRESSION: 1. Approximately 1.8 cm linear radiopaque  foreign body compatible with provided history of a sewing needle, is imbedded within the soft tissues about the plantar aspect of the first digit of the great toe without associated fracture. 2. Mild hallux valgus deformity with associated mild degenerative change of the first MTP joint. 3. Small plantar calcaneal spur. Electronically Signed   By: Sandi Mariscal M.D.   On: 11/05/2017 11:05    Procedures -  .Foreign Body Removal Date/Time: 11/05/2017 2:29 PM Performed by: Jola Schmidt, MD Authorized by: Jola Schmidt, MD  Consent: Verbal consent obtained. Risks and benefits: risks, benefits and alternatives were discussed Consent given by: patient Imaging studies: imaging studies available Body area: skin Anesthesia: local infiltration  Anesthesia: Local Anesthetic: lidocaine 2% with epinephrine Removal mechanism: forceps and scalpel Post-procedure assessment: foreign body not removed Patient tolerance: Patient tolerated the procedure well with no immediate complications Comments: Leonora Gores, PA-C, assisted with procedure    (including critical care time)  Medications Ordered in ED Medications  Tdap (BOOSTRIX) injection 0.5 mL (not administered)  cephALEXin (KEFLEX) capsule 500 mg (not administered)  lidocaine-EPINEPHrine (XYLOCAINE W/EPI) 2 %-1:200000 (PF) injection 10 mL (10 mLs Infiltration Given by Other 11/05/17 1230)     Initial Impression / Assessment and Plan / ED Course  I have reviewed the triage vital signs and the nursing notes.  Pertinent labs & imaging results that were available during my care of the patient were reviewed by me and considered in my medical decision making (see chart for details).   Patient presents with foreign body and discomfort to her right great toe.  X-ray revealed 1.8 cm linear radiopaque foreign body compatible with provided history of a sewing needle, which is imbedded within the soft tissues about the plantar aspect of the first digit  of the great toe without associated fracture.  X-ray reviewed.  Foreign body removal attempted with supervising physician Dr. Venora Maples which was unsuccessful.  Patient's tetanus updated, given a dose  of Keflex in the emergency department.  Dr. Venora Maples discussed case with Dr. Wynelle Link, orthopedic surgery, who recommends patient be discharged home with follow-up in his office tomorrow.  Will place patient on Keflex for infection prophylaxis.  Given prescription for naproxen for pain. Discussed results, treatment plan, need for ortho follow-up, and return precautions with the patient. Provided opportunity for questions, patient confirmed understanding and is in agreement with plan.   Final Clinical Impressions(s) / ED Diagnoses   Final diagnoses:  Foreign body (FB) in soft tissue    ED Discharge Orders        Ordered    cephALEXin (KEFLEX) 500 MG capsule  3 times daily     11/05/17 1419    naproxen (NAPROSYN) 375 MG tablet  2 times daily     11/05/17 [address] Glynda Jaeger, PA-C 11/05/17 2227    Jola Schmidt, MD 11/06/17 2241

## 2017-11-05 NOTE — ED Notes (Signed)
Ice pack given to patient and applied to right foot.

## 2017-11-05 NOTE — ED Notes (Signed)
Bed: WTR5 Expected date:  Expected time:  Means of arrival:  Comments: 

## 2017-11-05 NOTE — Discharge Instructions (Signed)
You were seen in the emergency department for a sewing needle that is stuck in your right first toe.  We attempted to remove the needle, however we were unsuccessful.  We have started you on antibiotics to prevent infection.  Keflex is the antibiotic, you will need to take this 3 times per day. Please take all of your antibiotics until finished. You may develop abdominal discomfort or diarrhea from the antibiotic.  You may help offset this with probiotics which you can buy at the store (ask your pharmacist if unable to find) or get probiotics in the form of eating yogurt. Do not eat or take the probiotics until 2 hours after your antibiotic. If you are unable to tolerate these side effects follow-up with your primary care provider or return to the emergency department.   If you begin to experience any blistering, rashes, swelling, or difficulty breathing seek medical care for evaluation of potentially more serious side effects.   Please be aware that this medication may interact with other medications you are taking, please be sure to discuss your medication list with your pharmacist.  Take naproxen, this is a nonsteroidal anti-inflammatory medication, for pain.  Take this once in the morning and once in the evening.  Do not take other NSAIDs with this medicine (Advil, Aleve, or Motrin).  You may supplement with Tylenol.  You will need to follow-up with the orthopedic doctor we have provided in your discharge instruction.  Call tomorrow in order to make an appointment.  For any new or worsening symptoms including but not limited to fever, redness around the area, discharge from the wound, swelling, increasing her pain, or any other concerns.

## 2017-11-05 NOTE — Telephone Encounter (Signed)
LM x 3 mychart msg sent.  .will close encounter d/t multiple attempts to reach

## 2017-11-06 ENCOUNTER — Ambulatory Visit (INDEPENDENT_AMBULATORY_CARE_PROVIDER_SITE_OTHER): Payer: 59 | Admitting: Orthopaedic Surgery

## 2017-11-06 ENCOUNTER — Telehealth: Payer: Self-pay | Admitting: Endocrinology

## 2017-11-06 ENCOUNTER — Encounter (INDEPENDENT_AMBULATORY_CARE_PROVIDER_SITE_OTHER): Payer: Self-pay | Admitting: Orthopaedic Surgery

## 2017-11-06 ENCOUNTER — Encounter (HOSPITAL_BASED_OUTPATIENT_CLINIC_OR_DEPARTMENT_OTHER): Payer: Self-pay | Admitting: *Deleted

## 2017-11-06 ENCOUNTER — Encounter (HOSPITAL_BASED_OUTPATIENT_CLINIC_OR_DEPARTMENT_OTHER)
Admission: RE | Admit: 2017-11-06 | Discharge: 2017-11-06 | Disposition: A | Discharge status: Home / Self Care | Payer: 59 | Source: Ambulatory Visit | Attending: Orthopaedic Surgery | Admitting: Orthopaedic Surgery

## 2017-11-06 ENCOUNTER — Other Ambulatory Visit (INDEPENDENT_AMBULATORY_CARE_PROVIDER_SITE_OTHER): Payer: Self-pay | Admitting: Orthopaedic Surgery

## 2017-11-06 DIAGNOSIS — Z8349 Family history of other endocrine, nutritional and metabolic diseases: Secondary | ICD-10-CM | POA: Diagnosis not present

## 2017-11-06 DIAGNOSIS — M1712 Unilateral primary osteoarthritis, left knee: Secondary | ICD-10-CM | POA: Diagnosis not present

## 2017-11-06 DIAGNOSIS — Z90710 Acquired absence of both cervix and uterus: Secondary | ICD-10-CM | POA: Diagnosis not present

## 2017-11-06 DIAGNOSIS — Z8261 Family history of arthritis: Secondary | ICD-10-CM | POA: Diagnosis not present

## 2017-11-06 DIAGNOSIS — E119 Type 2 diabetes mellitus without complications: Secondary | ICD-10-CM | POA: Diagnosis not present

## 2017-11-06 DIAGNOSIS — Z811 Family history of alcohol abuse and dependence: Secondary | ICD-10-CM | POA: Diagnosis not present

## 2017-11-06 DIAGNOSIS — S90454A Superficial foreign body, right lesser toe(s), initial encounter: Secondary | ICD-10-CM | POA: Insufficient documentation

## 2017-11-06 DIAGNOSIS — Z833 Family history of diabetes mellitus: Secondary | ICD-10-CM | POA: Diagnosis not present

## 2017-11-06 DIAGNOSIS — Z825 Family history of asthma and other chronic lower respiratory diseases: Secondary | ICD-10-CM | POA: Diagnosis not present

## 2017-11-06 DIAGNOSIS — Z842 Family history of other diseases of the genitourinary system: Secondary | ICD-10-CM | POA: Diagnosis not present

## 2017-11-06 DIAGNOSIS — Z79899 Other long term (current) drug therapy: Secondary | ICD-10-CM | POA: Diagnosis not present

## 2017-11-06 DIAGNOSIS — G4730 Sleep apnea, unspecified: Secondary | ICD-10-CM | POA: Diagnosis not present

## 2017-11-06 DIAGNOSIS — F1411 Cocaine abuse, in remission: Secondary | ICD-10-CM | POA: Diagnosis not present

## 2017-11-06 DIAGNOSIS — Z7984 Long term (current) use of oral hypoglycemic drugs: Secondary | ICD-10-CM | POA: Diagnosis not present

## 2017-11-06 DIAGNOSIS — Z83511 Family history of glaucoma: Secondary | ICD-10-CM | POA: Diagnosis not present

## 2017-11-06 DIAGNOSIS — R42 Dizziness and giddiness: Secondary | ICD-10-CM | POA: Diagnosis not present

## 2017-11-06 DIAGNOSIS — F1911 Other psychoactive substance abuse, in remission: Secondary | ICD-10-CM | POA: Diagnosis not present

## 2017-11-06 DIAGNOSIS — M795 Residual foreign body in soft tissue: Secondary | ICD-10-CM | POA: Diagnosis not present

## 2017-11-06 DIAGNOSIS — Z8249 Family history of ischemic heart disease and other diseases of the circulatory system: Secondary | ICD-10-CM | POA: Diagnosis not present

## 2017-11-06 DIAGNOSIS — Z8673 Personal history of transient ischemic attack (TIA), and cerebral infarction without residual deficits: Secondary | ICD-10-CM | POA: Diagnosis not present

## 2017-11-06 DIAGNOSIS — Z87891 Personal history of nicotine dependence: Secondary | ICD-10-CM | POA: Diagnosis not present

## 2017-11-06 LAB — BASIC METABOLIC PANEL
ANION GAP: 10 (ref 5–15)
BUN: 17 mg/dL (ref 6–20)
CHLORIDE: 109 mmol/L (ref 101–111)
CO2: 24 mmol/L (ref 22–32)
Calcium: 9 mg/dL (ref 8.9–10.3)
Creatinine, Ser: 0.81 mg/dL (ref 0.44–1.00)
GFR calc non Af Amer: >60 mL/min (ref >60)
Glucose, Bld: 114 mg/dL — ABNORMAL HIGH (ref 65–99)
Potassium: 3.7 mmol/L (ref 3.5–5.1)
Sodium: 143 mmol/L (ref 135–145)

## 2017-11-06 MED ORDER — CIPROFLOXACIN HCL 500 MG PO TABS: 500.0000 mg | ORAL_TABLET | Freq: Two times a day (BID) | ORAL | 0 refills | Status: DC

## 2017-11-06 NOTE — Progress Notes (Signed)
EKG reviewed and Anesthesia consult per Dr. Michelle Piperssey, labs and current weight obtained, will proceed with surgery as scheduled.

## 2017-11-06 NOTE — Telephone Encounter (Signed)
Pt is also needing a new meter. She wanted to know how she can go about getting a new one  Please advise.

## 2017-11-06 NOTE — Progress Notes (Signed)
Office Visit Note   Patient: Monique Nelson           Date of Birth: 1966-10-09           MRN: [id number] Visit Date: 11/06/2017              Requested by: Burnis Medin, MD [place], [place] [id number] PCP: Burnis Medin, MD   Assessment & Plan: Visit Diagnoses:  1. Unilateral primary osteoarthritis, left knee   2. Foreign body of toe of right foot, initial encounter     Plan: MRI findings with mild osteoarthritis.  Discussed the importance of weight loss with her symptoms.  In terms of her right toe I reviewed the x-rays and it does show a large needle in the plantars soft tissues of the great toe.  I recommend removal tomorrow at the outpatient surgical center.  Patient understands risk benefits alternatives to surgery.  Prescription for Cipro today.  Bactroban ointment.  Follow-up 2 weeks after the surgery.  Follow-Up Instructions: Return for 1 week postop visit.   Orders:  No orders of the defined types were placed in this encounter.  Meds ordered this encounter  Medications  . ciprofloxacin (CIPRO) 500 MG tablet    Sig: Take 1 tablet (500 mg total) by mouth 2 (two) times daily.    Dispense:  20 tablet    Refill:  0      Procedures: No procedures performed   Clinical Data: No additional findings.   Subjective: Chief Complaint  Patient presents with  . Left Knee - Pain  . Right Foot - Pain     Patient follows up today for MRI review and new problem of right great toe injury with retained needle in the soft tissues.  She went to the ED yesterday and the ED attempted to remove the needle but was unsuccessful.  She is currently taking Keflex.  Denies any fevers or chills.    Review of Systems  Constitutional: Negative.   HENT: Negative.   Eyes: Negative.   Respiratory: Negative.   Cardiovascular: Negative.   Endocrine: Negative.   Musculoskeletal: Negative.   Neurological: Negative.   Hematological: Negative.     Psychiatric/Behavioral: Negative.   All other systems reviewed and are negative.    Objective: Vital Signs: LMP 12/15/2011   Physical Exam  Constitutional: She is oriented to person, place, and time. She appears well-developed and well-nourished.  Pulmonary/Chest: Effort normal.  Neurological: She is alert and oriented to person, place, and time.  Skin: Skin is warm. Capillary refill takes less than 2 seconds.  Psychiatric: She has a normal mood and affect. Her behavior is normal. Judgment and thought content normal.  Nursing note and vitals reviewed.   Ortho Exam Right great toe exam shows no significant swelling.  There is an small open lesion from the previous procedure.  Toe is neurovascularly intact. Knee exam is stable. Specialty Comments:  No specialty comments available.  Imaging: Dg Foot Complete Right  Result Date: 11/05/2017 CLINICAL DATA:  Stepped on a sowing needle. Evaluate for foreign body. EXAM: RIGHT FOOT COMPLETE - 3+ VIEW COMPARISON:  Right foot radiographs-04/08/2016 FINDINGS: There is an approximately 1.8 cm linear radiopaque foreign body, imbedded within the tissues about the plantar aspect of the first digit of the great toe. No associated fracture or subcutaneous emphysema. Mild hallux valgus deformity with associated mild degenerative change of the first MTP joint. Remaining joint spaces appear preserved. No erosions. Small plantar  calcaneal spur. IMPRESSION: 1. Approximately 1.8 cm linear radiopaque foreign body compatible with provided history of a sewing needle, is imbedded within the soft tissues about the plantar aspect of the first digit of the great toe without associated fracture. 2. Mild hallux valgus deformity with associated mild degenerative change of the first MTP joint. 3. Small plantar calcaneal spur. Electronically Signed   By: Sandi Mariscal M.D.   On: 11/05/2017 11:05     PMFS History: Patient Active Problem List   Diagnosis Date Noted  .  Foreign body of toe of right foot 11/06/2017  . Diabetes mellitus without complication ([place]) 85/27/7824  . Solar dermatitis ? 05/05/2014  . Allergic conjunctivitis and rhinitis 12/23/2013  . Hypokalemia 12/23/2013  . Prediabetes 12/23/2013  . Family history of sleep apnea 11/17/2013  . OSA (obstructive sleep apnea) 11/17/2013  . Snoring 11/17/2013  . Hot flushes, perimenopausal 11/17/2013  . Sleep difficulties 08/05/2013  . Bacterial vaginosis 08/05/2013  . Fasting hyperglycemia 01/10/2013  . Diarrhea 01/10/2013  . Abdominal pain, epigastric 01/10/2013  . Nausea with vomiting 01/10/2013  . FH: Crohn's disease 01/10/2013  . Headache(784.0) 09/10/2012  . Elevated blood pressure reading 09/10/2012  . Menopausal hot flushes 08/02/2012  . Visit for preventive health examination 08/02/2012  . Hx of hysterectomy   . Ex-smoker   . Substance abuse in remission ([place])   . Morbid obesity ([place]) 12/10/2007  . Allergic rhinitis 12/10/2007  . VERTIGO 12/10/2007   Past Medical History:  Diagnosis Date  . Diabetes mellitus without complication ([place])   . Drug addiction ([place])    recovering  . Ex-smoker 2009  . History of fracture of wrist 17 years ago   has plate and pins right   . Hx of hysterectomy    bleeding and cysts  . Hx of varicella   . Stroke ([place])   . Substance abuse in remission ([place]) 13 years ago    cocaine heroiin  met   support via ADP  . TIA (transient ischemic attack)   . Vertigo    intermittent infrequent    Family History  Problem Relation Age of Onset  . COPD Mother   . Diabetes Mother   . Hypertension Mother   . Sleep apnea Mother        and sister   . Thyroid disease Mother   . Chronic fatigue Mother   . Osteoarthritis Mother        knee replacement   . Glaucoma Father   . Other Father        prostate disease  . Alcohol abuse Unknown        drug parent    Past Surgical History:  Procedure Laterality Date  . ABDOMINAL HYSTERECTOMY    . ablastion    .  rt hand surgery     17 years ago   Social History   Occupational History    Employer: Theme park manager  Tobacco Use  . Smoking status: Former Smoker    Packs/day: 0.04    Years: 8.00    Pack years: 0.32    Types: Cigarettes    Start date: 10/21/2013  . Smokeless tobacco: Never Used  . Tobacco comment: off and on x 8 yrs  Substance and Sexual Activity  . Alcohol use: No  . Drug use: Yes    Types: "Crack" cocaine, Heroin, Methamphetamines, Marijuana    Comment: History- 14 years clean-11/21/13  . Sexual activity: Not on file

## 2017-11-06 NOTE — Telephone Encounter (Signed)
Pt came in to office to inform us that a neddle broke off in her right foot, she went to the Er to try to get it removed,. They were unable to get the needle out. She is going in tomorrow (11/07/17) to have surgery to have it removed. She was coming in to let us know so Dr is aware.

## 2017-11-07 ENCOUNTER — Ambulatory Visit (HOSPITAL_BASED_OUTPATIENT_CLINIC_OR_DEPARTMENT_OTHER): Payer: 59 | Admitting: Certified Registered"

## 2017-11-07 ENCOUNTER — Encounter (HOSPITAL_BASED_OUTPATIENT_CLINIC_OR_DEPARTMENT_OTHER)
Admission: RE | Disposition: A | Discharge status: Home / Self Care | Payer: Self-pay | Source: Ambulatory Visit | Attending: Orthopaedic Surgery

## 2017-11-07 ENCOUNTER — Other Ambulatory Visit: Payer: Self-pay

## 2017-11-07 ENCOUNTER — Ambulatory Visit (HOSPITAL_BASED_OUTPATIENT_CLINIC_OR_DEPARTMENT_OTHER)
Admission: RE | Admit: 2017-11-07 | Discharge: 2017-11-07 | Disposition: A | Discharge status: Home / Self Care | Payer: 59 | Source: Ambulatory Visit | Attending: Orthopaedic Surgery | Admitting: Orthopaedic Surgery

## 2017-11-07 ENCOUNTER — Encounter (HOSPITAL_BASED_OUTPATIENT_CLINIC_OR_DEPARTMENT_OTHER): Payer: Self-pay | Admitting: Anesthesiology

## 2017-11-07 DIAGNOSIS — F1411 Cocaine abuse, in remission: Secondary | ICD-10-CM | POA: Insufficient documentation

## 2017-11-07 DIAGNOSIS — G4730 Sleep apnea, unspecified: Secondary | ICD-10-CM | POA: Insufficient documentation

## 2017-11-07 DIAGNOSIS — Z87891 Personal history of nicotine dependence: Secondary | ICD-10-CM | POA: Insufficient documentation

## 2017-11-07 DIAGNOSIS — Z90710 Acquired absence of both cervix and uterus: Secondary | ICD-10-CM | POA: Insufficient documentation

## 2017-11-07 DIAGNOSIS — Z842 Family history of other diseases of the genitourinary system: Secondary | ICD-10-CM | POA: Insufficient documentation

## 2017-11-07 DIAGNOSIS — Z8349 Family history of other endocrine, nutritional and metabolic diseases: Secondary | ICD-10-CM | POA: Insufficient documentation

## 2017-11-07 DIAGNOSIS — Z825 Family history of asthma and other chronic lower respiratory diseases: Secondary | ICD-10-CM | POA: Insufficient documentation

## 2017-11-07 DIAGNOSIS — Z833 Family history of diabetes mellitus: Secondary | ICD-10-CM | POA: Insufficient documentation

## 2017-11-07 DIAGNOSIS — Z7984 Long term (current) use of oral hypoglycemic drugs: Secondary | ICD-10-CM | POA: Insufficient documentation

## 2017-11-07 DIAGNOSIS — M795 Residual foreign body in soft tissue: Secondary | ICD-10-CM | POA: Diagnosis not present

## 2017-11-07 DIAGNOSIS — E119 Type 2 diabetes mellitus without complications: Secondary | ICD-10-CM | POA: Insufficient documentation

## 2017-11-07 DIAGNOSIS — Z8673 Personal history of transient ischemic attack (TIA), and cerebral infarction without residual deficits: Secondary | ICD-10-CM | POA: Insufficient documentation

## 2017-11-07 DIAGNOSIS — Z811 Family history of alcohol abuse and dependence: Secondary | ICD-10-CM | POA: Insufficient documentation

## 2017-11-07 DIAGNOSIS — S90454A Superficial foreign body, right lesser toe(s), initial encounter: Secondary | ICD-10-CM | POA: Diagnosis not present

## 2017-11-07 DIAGNOSIS — F1911 Other psychoactive substance abuse, in remission: Secondary | ICD-10-CM | POA: Insufficient documentation

## 2017-11-07 DIAGNOSIS — Z8261 Family history of arthritis: Secondary | ICD-10-CM | POA: Insufficient documentation

## 2017-11-07 DIAGNOSIS — Z79899 Other long term (current) drug therapy: Secondary | ICD-10-CM | POA: Insufficient documentation

## 2017-11-07 DIAGNOSIS — Z83511 Family history of glaucoma: Secondary | ICD-10-CM | POA: Insufficient documentation

## 2017-11-07 DIAGNOSIS — R42 Dizziness and giddiness: Secondary | ICD-10-CM | POA: Insufficient documentation

## 2017-11-07 DIAGNOSIS — Z8249 Family history of ischemic heart disease and other diseases of the circulatory system: Secondary | ICD-10-CM | POA: Insufficient documentation

## 2017-11-07 HISTORY — DX: Residual foreign body in soft tissue: M79.5

## 2017-11-07 HISTORY — PX: FOREIGN BODY REMOVAL: SHX962

## 2017-11-07 HISTORY — DX: Sleep apnea, unspecified: G47.30

## 2017-11-07 LAB — GLUCOSE, CAPILLARY
Glucose-Capillary: 84 mg/dL (ref 65–99)
Glucose-Capillary: 95 mg/dL (ref 65–99)

## 2017-11-07 SURGERY — FOREIGN BODY REMOVAL ADULT
Anesthesia: General | Site: Toe | Laterality: Right

## 2017-11-07 MED ORDER — KETOROLAC TROMETHAMINE 30 MG/ML IJ SOLN
INTRAMUSCULAR | Status: DC | PRN
  Administered 2017-11-07: 30 mg via INTRAVENOUS

## 2017-11-07 MED ORDER — CEFAZOLIN SODIUM-DEXTROSE 2-4 GM/100ML-% IV SOLN
2.0000 g | INTRAVENOUS | Status: AC
  Administered 2017-11-07: 2 g via INTRAVENOUS

## 2017-11-07 MED ORDER — BUPIVACAINE HCL (PF) 0.5 % IJ SOLN
INTRAMUSCULAR | Status: DC | PRN
  Administered 2017-11-07: 10 mL

## 2017-11-07 MED ORDER — LACTATED RINGERS IV SOLN: INTRAVENOUS | Status: DC

## 2017-11-07 MED ORDER — PROPOFOL 10 MG/ML IV BOLUS
INTRAVENOUS | Status: DC | PRN
  Administered 2017-11-07: 200 mg via INTRAVENOUS
  Administered 2017-11-07: 100 mg via INTRAVENOUS

## 2017-11-07 MED ORDER — MIDAZOLAM HCL 2 MG/2ML IJ SOLN
INTRAMUSCULAR | Status: AC
  Filled 2017-11-07: qty 2

## 2017-11-07 MED ORDER — FENTANYL CITRATE (PF) 100 MCG/2ML IJ SOLN
INTRAMUSCULAR | Status: AC
  Filled 2017-11-07: qty 2

## 2017-11-07 MED ORDER — DEXAMETHASONE SODIUM PHOSPHATE 10 MG/ML IJ SOLN
INTRAMUSCULAR | Status: AC
  Filled 2017-11-07: qty 1

## 2017-11-07 MED ORDER — ONDANSETRON HCL 4 MG/2ML IJ SOLN
INTRAMUSCULAR | Status: AC
  Filled 2017-11-07: qty 2

## 2017-11-07 MED ORDER — TRAMADOL HCL 50 MG PO TABS: 50.0000 mg | ORAL_TABLET | Freq: Three times a day (TID) | ORAL | 2 refills | Status: DC | PRN

## 2017-11-07 MED ORDER — PROPOFOL 10 MG/ML IV BOLUS
INTRAVENOUS | Status: AC
Start: 2017-11-07 — End: ?
  Filled 2017-11-07: qty 20

## 2017-11-07 MED ORDER — CHLORHEXIDINE GLUCONATE 4 % EX LIQD: 60.0000 mL | Freq: Once | CUTANEOUS | Status: DC

## 2017-11-07 MED ORDER — SCOPOLAMINE 1 MG/3DAYS TD PT72: 1.0000 | MEDICATED_PATCH | Freq: Once | TRANSDERMAL | Status: DC | PRN

## 2017-11-07 MED ORDER — PROMETHAZINE HCL 25 MG/ML IJ SOLN: 6.2500 mg | INTRAMUSCULAR | Status: DC | PRN

## 2017-11-07 MED ORDER — LIDOCAINE HCL (PF) 1 % IJ SOLN
INTRAMUSCULAR | Status: AC
  Filled 2017-11-07: qty 30

## 2017-11-07 MED ORDER — CEFAZOLIN SODIUM-DEXTROSE 2-4 GM/100ML-% IV SOLN
INTRAVENOUS | Status: AC
Start: 2017-11-07 — End: ?
  Filled 2017-11-07: qty 100

## 2017-11-07 MED ORDER — FENTANYL CITRATE (PF) 100 MCG/2ML IJ SOLN
50.0000 ug | INTRAMUSCULAR | Status: DC | PRN
  Administered 2017-11-07 (×2): 50 ug via INTRAVENOUS

## 2017-11-07 MED ORDER — MIDAZOLAM HCL 2 MG/2ML IJ SOLN
1.0000 mg | INTRAMUSCULAR | Status: DC | PRN
  Administered 2017-11-07: 2 mg via INTRAVENOUS

## 2017-11-07 MED ORDER — OXYCODONE HCL 5 MG PO TABS
ORAL_TABLET | ORAL | Status: AC
  Filled 2017-11-07: qty 1

## 2017-11-07 MED ORDER — OXYCODONE HCL 5 MG PO TABS
5.0000 mg | ORAL_TABLET | Freq: Once | ORAL | Status: AC
  Administered 2017-11-07: 5 mg via ORAL

## 2017-11-07 MED ORDER — FENTANYL CITRATE (PF) 100 MCG/2ML IJ SOLN: 25.0000 ug | INTRAMUSCULAR | Status: DC | PRN

## 2017-11-07 MED ORDER — LACTATED RINGERS IV SOLN
INTRAVENOUS | Status: DC
  Administered 2017-11-07: 15:00:00 via INTRAVENOUS

## 2017-11-07 MED ORDER — BUPIVACAINE HCL (PF) 0.5 % IJ SOLN
INTRAMUSCULAR | Status: AC
  Filled 2017-11-07: qty 30

## 2017-11-07 MED ORDER — ONDANSETRON HCL 4 MG/2ML IJ SOLN
INTRAMUSCULAR | Status: DC | PRN
  Administered 2017-11-07: 4 mg via INTRAVENOUS

## 2017-11-07 SURGICAL SUPPLY — 56 items
BANDAGE ACE 4X5 VEL STRL LF (GAUZE/BANDAGES/DRESSINGS) IMPLANT
BLADE HEX COATED 2.75 (ELECTRODE) IMPLANT
BLADE SURG 15 STRL LF DISP TIS (BLADE) ×2 IMPLANT
BLADE SURG 15 STRL SS (BLADE) ×4
CANISTER SUCT 1200ML W/VALVE (MISCELLANEOUS) ×2 IMPLANT
COVER BACK TABLE 60X90IN (DRAPES) ×2 IMPLANT
CUFF TOURNIQUET SINGLE 24IN (TOURNIQUET CUFF) IMPLANT
CUFF TOURNIQUET SINGLE 34IN LL (TOURNIQUET CUFF) IMPLANT
DECANTER SPIKE VIAL GLASS SM (MISCELLANEOUS) IMPLANT
DRAPE EXTREMITY T 121X128X90 (DRAPE) ×2 IMPLANT
DRAPE IMP U-DRAPE 54X76 (DRAPES) ×2 IMPLANT
DRAPE SURG 17X23 STRL (DRAPES) IMPLANT
DRAPE U-SHAPE 47X51 STRL (DRAPES) ×1 IMPLANT
DURAPREP 26ML APPLICATOR (WOUND CARE) ×2 IMPLANT
ELECT REM PT RETURN 9FT ADLT (ELECTROSURGICAL) ×2
ELECTRODE REM PT RTRN 9FT ADLT (ELECTROSURGICAL) ×1 IMPLANT
GAUZE SPONGE 4X4 12PLY STRL (GAUZE/BANDAGES/DRESSINGS) ×2 IMPLANT
GAUZE XEROFORM 1X8 LF (GAUZE/BANDAGES/DRESSINGS) ×2 IMPLANT
GLOVE BIOGEL PI IND STRL 6.5 (GLOVE) IMPLANT
GLOVE BIOGEL PI IND STRL 7.0 (GLOVE) IMPLANT
GLOVE BIOGEL PI INDICATOR 6.5 (GLOVE) ×2
GLOVE BIOGEL PI INDICATOR 7.0 (GLOVE) ×1
GLOVE ECLIPSE 7.0 STRL STRAW (GLOVE) ×1 IMPLANT
GLOVE SKINSENSE NS SZ7.5 (GLOVE) ×1
GLOVE SKINSENSE STRL SZ7.5 (GLOVE) ×1 IMPLANT
GLOVE SURG SYN 7.5  E (GLOVE) ×1
GLOVE SURG SYN 7.5 E (GLOVE) ×1 IMPLANT
GLOVE SURG SYN 7.5 PF PI (GLOVE) ×1 IMPLANT
GOWN STRL REIN XL XLG (GOWN DISPOSABLE) ×3 IMPLANT
GOWN STRL REUS W/ TWL LRG LVL3 (GOWN DISPOSABLE) ×1 IMPLANT
GOWN STRL REUS W/TWL LRG LVL3 (GOWN DISPOSABLE) ×2
NEEDLE HYPO 22GX1.5 SAFETY (NEEDLE) ×1 IMPLANT
NS IRRIG 1000ML POUR BTL (IV SOLUTION) ×2 IMPLANT
PACK BASIN DAY SURGERY FS (CUSTOM PROCEDURE TRAY) ×2 IMPLANT
PAD CAST 3X4 CTTN HI CHSV (CAST SUPPLIES) IMPLANT
PAD CAST 4YDX4 CTTN HI CHSV (CAST SUPPLIES) IMPLANT
PADDING CAST COTTON 3X4 STRL (CAST SUPPLIES) ×2
PADDING CAST COTTON 4X4 STRL (CAST SUPPLIES) ×2
PADDING CAST SYN 6 (CAST SUPPLIES)
PADDING CAST SYNTHETIC 4 (CAST SUPPLIES)
PADDING CAST SYNTHETIC 4X4 STR (CAST SUPPLIES) IMPLANT
PADDING CAST SYNTHETIC 6X4 NS (CAST SUPPLIES) IMPLANT
PENCIL BUTTON HOLSTER BLD 10FT (ELECTRODE) ×2 IMPLANT
SLEEVE SCD COMPRESS KNEE MED (MISCELLANEOUS) ×1 IMPLANT
SPONGE LAP 18X18 X RAY DECT (DISPOSABLE) IMPLANT
STAPLER VISISTAT (STAPLE) IMPLANT
STOCKINETTE 4X48 STRL (DRAPES) IMPLANT
STOCKINETTE 6  STRL (DRAPES) ×1
STOCKINETTE 6 STRL (DRAPES) IMPLANT
SUT ETHILON 3 0 PS 1 (SUTURE) ×1 IMPLANT
SUT VIC AB 2-0 CT1 27 (SUTURE)
SUT VIC AB 2-0 CT1 TAPERPNT 27 (SUTURE) IMPLANT
SYR BULB 3OZ (MISCELLANEOUS) ×2 IMPLANT
SYR CONTROL 10ML LL (SYRINGE) IMPLANT
TOWEL OR 17X24 6PK STRL BLUE (TOWEL DISPOSABLE) ×2 IMPLANT
UNDERPAD 30X30 (UNDERPADS AND DIAPERS) ×2 IMPLANT

## 2017-11-07 NOTE — Anesthesia Procedure Notes (Signed)
Procedure Name: LMA Insertion Date/Time: 11/07/2017 3:19 PM Performed by: Burna Cashonrad, Ryla Cauthon C, CRNA Pre-anesthesia Checklist: Patient identified, Emergency Drugs available, Suction available and Patient being monitored Patient Re-evaluated:Patient Re-evaluated prior to induction Oxygen Delivery Method: Circle system utilized Preoxygenation: Pre-oxygenation with 100% oxygen Induction Type: IV induction Ventilation: Mask ventilation without difficulty LMA: LMA inserted LMA Size: 4.0 Number of attempts: 1 Airway Equipment and Method: Bite block Placement Confirmation: positive ETCO2 Tube secured with: Tape Dental Injury: Teeth and Oropharynx as per pre-operative assessment

## 2017-11-07 NOTE — Anesthesia Postprocedure Evaluation (Signed)
Anesthesia Post Note  Patient: Monique DittoLetitia A Nelson  Procedure(s) Performed: RIGHT GREAT TOE FOREIGN BODY REMOVAL (Right Toe)     Patient location during evaluation: PACU Anesthesia Type: General Level of consciousness: awake and alert Pain management: pain level controlled Vital Signs Assessment: post-procedure vital signs reviewed and stable Respiratory status: spontaneous breathing, nonlabored ventilation and respiratory function stable Cardiovascular status: blood pressure returned to baseline and stable Postop Assessment: no apparent nausea or vomiting Anesthetic complications: no    Last Vitals:  Vitals:   11/07/17 1615 11/07/17 1630  BP: (!) 162/90 (!) 149/84  Pulse: 77 68  Resp: (!) 22 16  Temp:    SpO2: 100% 100%    Last Pain:  Vitals:   11/07/17 1630  TempSrc:   PainSc: 4         RLE Motor Response: Purposeful movement (11/07/17 1630) RLE Sensation: Full sensation (11/07/17 1630)      Beryle Lathehomas E Brock

## 2017-11-07 NOTE — Transfer of Care (Signed)
Immediate Anesthesia Transfer of Care Note  Patient: Monique Nelson  Procedure(s) Performed: RIGHT GREAT TOE FOREIGN BODY REMOVAL (Right Toe)  Patient Location: PACU  Anesthesia Type:General  Level of Consciousness: sedated  Airway & Oxygen Therapy: Patient Spontanous Breathing and Patient connected to face mask oxygen  Post-op Assessment: Report given to RN and Post -op Vital signs reviewed and stable  Post vital signs: Reviewed and stable  Last Vitals:  Vitals:   11/07/17 1426 11/07/17 1552  BP: 139/68   Pulse: 79 93  Resp: 16 (!) 28  Temp: 36.9 C (!) (P) 36.4 C  SpO2: 98% 100%    Last Pain:  Vitals:   11/07/17 1426  TempSrc: Oral  PainSc: 7       Patients Stated Pain Goal: 3 (11/07/17 1426)  Complications: No apparent anesthesia complications

## 2017-11-07 NOTE — H&P (Signed)
PREOPERATIVE H&P  Chief Complaint: right great toe foreign body  HPI: Monique Nelson is a 52 y.o. female who presents for surgical treatment of right great toe foreign body.  She denies any changes in medical history.  Past Medical History:  Diagnosis Date  . Diabetes mellitus without complication ([place])   . Drug addiction ([place])    recovering  . Ex-smoker 2009  . Foreign body (FB) in soft tissue    right great toe  . History of fracture of wrist 17 years ago   has plate and pins right   . Hx of hysterectomy    bleeding and cysts  . Hx of varicella   . Sleep apnea    uses CPAP nightly  . Stroke ([place])   . Substance abuse in remission ([place]) 13 years ago    cocaine heroiin  met   support via ADP  . TIA (transient ischemic attack)   . Vertigo    intermittent infrequent   Past Surgical History:  Procedure Laterality Date  . ABDOMINAL HYSTERECTOMY    . ABDOMINAL HYSTERECTOMY    . ablastion    . rt hand surgery     17 years ago  . WRIST FRACTURE SURGERY     Social History   Socioeconomic History  . Marital status: Single    Spouse name: None  . Number of children: 1  . Years of education: None  . Highest education level: None  Social Needs  . Financial resource strain: None  . Food insecurity - worry: None  . Food insecurity - inability: None  . Transportation needs - medical: None  . Transportation needs - non-medical: None  Occupational History    Employer: Theme park manager  Tobacco Use  . Smoking status: Former Smoker    Packs/day: 0.25    Years: 8.00    Pack years: 2.00    Types: Cigarettes    Start date: 10/21/2013  . Smokeless tobacco: Never Used  . Tobacco comment: off and on x 8 yrs  Substance and Sexual Activity  . Alcohol use: No  . Drug use: Yes    Types: "Crack" cocaine, Heroin, Methamphetamines, Marijuana    Comment: History- 14 years clean  . Sexual activity: None  Other Topics Concern  . 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 COPD Mother   . Diabetes Mother   . Hypertension Mother   . Sleep apnea Mother        and sister   . Thyroid disease Mother   . Chronic fatigue Mother   . Osteoarthritis Mother        knee replacement   . Glaucoma Father   . Other Father        prostate disease  . Alcohol abuse Unknown        drug parent   Allergies  Allergen Reactions  . Other Other (See Comments)    ALL NARCOTICS-PATIENT PREFERENCE, PAST HISTORY   Prior to Admission medications   Medication Sig Start Date End Date Taking? Authorizing Provider  canagliflozin (INVOKANA) 100 MG TABS  tablet Take 1 tablet (100 mg total) by mouth daily before breakfast. 05/06/17  Yes Gareth Morgan, MD  cetirizine (ZYRTEC) 10 MG tablet Take 10 mg by mouth daily as needed for allergies.    Yes [provider]  ciprofloxacin (CIPRO) 500 MG tablet Take 1 tablet (500 mg total) by mouth 2 (two) times daily. 11/06/17  Yes Leandrew Koyanagi, MD  gabapentin (NEURONTIN) 100 MG capsule Take 1 capsule (100 mg total) by mouth 2 (two) times daily. 01/22/17  Yes Elayne Snare, MD  liraglutide (VICTOZA) 18 MG/3ML SOPN INJECT 1.'8MG'$  INTO THE SKIN EVERY DAY 01/22/17  Yes Elayne Snare, MD  Lorcaserin HCl ER (BELVIQ XR) 20 MG TB24 Take 20 mg by mouth daily with breakfast. 04/16/17  Yes Elayne Snare, MD  rosuvastatin (CRESTOR) 20 MG tablet Take 1 tablet (20 mg total) by mouth daily. 01/22/17  Yes Elayne Snare, MD  sodium chloride (OCEAN) 0.65 % SOLN nasal spray Place 1 spray into both nostrils as needed for congestion. 02/06/16  Yes Charlesetta Shanks, MD  Tetrahydrozoline-Zn Sulfate (EYE DROPS ALLERGY RELIEF OP) Apply 2 drops to eye daily.   Yes [provider]  albuterol (PROVENTIL HFA;VENTOLIN  HFA) 108 (90 Base) MCG/ACT inhaler Inhale 2 puffs into the lungs every 6 (six) hours as needed for wheezing or shortness of breath. 03/07/17   Panosh, [name], MD  dimenhyDRINATE (DRAMAMINE) 50 MG tablet Take 50 mg by mouth every 8 (eight) hours as needed for nausea.    [provider]  naproxen (NAPROSYN) 375 MG tablet Take 1 tablet (375 mg total) by mouth 2 (two) times daily. 11/05/17   Petrucelli, Samantha R, PA-C     Positive ROS: All other systems have been reviewed and were otherwise negative with the exception of those mentioned in the HPI and as above.  Physical Exam: General: Alert, no acute distress Cardiovascular: No pedal edema Respiratory: No cyanosis, no use of accessory musculature GI: abdomen soft Skin: No lesions in the area of chief complaint Neurologic: Sensation intact distally Psychiatric: Patient is competent for consent with normal mood and affect Lymphatic: no lymphedema  MUSCULOSKELETAL: exam stable  Assessment: right great toe foreign body  Plan: Plan for Procedure(s): RIGHT GREAT TOE FOREIGN BODY REMOVAL  The risks benefits and alternatives were discussed with the patient including but not limited to the risks of nonoperative treatment, versus surgical intervention including infection, bleeding, nerve injury,  blood clots, cardiopulmonary complications, morbidity, mortality, among others, and they were willing to proceed.   Eduard Roux, MD   11/07/2017 7:30 AM

## 2017-11-07 NOTE — Anesthesia Preprocedure Evaluation (Signed)
Anesthesia Evaluation  Patient identified by MRN, date of birth, ID band Patient awake    Reviewed: Allergy & Precautions, H&P , NPO status , Patient's Chart, lab work & pertinent test results  History of Anesthesia Complications Negative for: history of anesthetic complications  Airway Mallampati: II  TM Distance: >3 FB Neck ROM: Full    Dental no notable dental hx. (+) Dental Advisory Given   Pulmonary neg pulmonary ROS, former smoker,    Pulmonary exam normal        Cardiovascular negative cardio ROS Normal cardiovascular exam     Neuro/Psych TIAnegative psych ROS   GI/Hepatic negative GI ROS, Neg liver ROS,   Endo/Other  diabetesMorbid obesity  Renal/GU negative Renal ROS  negative genitourinary   Musculoskeletal   Abdominal   Peds  Hematology negative hematology ROS (+)   Anesthesia Other Findings   Reproductive/Obstetrics negative OB ROS                             Anesthesia Physical  Anesthesia Plan  ASA: III  Anesthesia Plan: General   Post-op Pain Management:    Induction: Intravenous  PONV Risk Score and Plan: 3 and Ondansetron, Dexamethasone and Scopolamine patch - Pre-op  Airway Management Planned: LMA and Oral ETT  Additional Equipment:   Intra-op Plan:   Post-operative Plan: Extubation in OR  Informed Consent: I have reviewed the patients History and Physical, chart, labs and discussed the procedure including the risks, benefits and alternatives for the proposed anesthesia with the patient or authorized representative who has indicated his/her understanding and acceptance.   Dental advisory given  Plan Discussed with: CRNA, Anesthesiologist and Surgeon  Anesthesia Plan Comments:         Anesthesia Quick Evaluation

## 2017-11-07 NOTE — Discharge Instructions (Signed)
Post Anesthesia Home Care Instructions  Activity: Get plenty of rest for the remainder of the day. A responsible individual must stay with you for 24 hours following the procedure.  For the next 24 hours, DO NOT: -Drive a car -Advertising copywriter -Drink alcoholic beverages -Take any medication unless instructed by your physician -Make any legal decisions or sign important papers.  Meals: Start with liquid foods such as gelatin or soup. Progress to regular foods as tolerated. Avoid greasy, spicy, heavy foods. If nausea and/or vomiting occur, drink only clear liquids until the nausea and/or vomiting subsides. Call your physician if vomiting continues.  Special Instructions/Symptoms: Your throat may feel dry or sore from the anesthesia or the breathing tube placed in your throat during surgery. If this causes discomfort, gargle with warm salt water. The discomfort should disappear within 24 hours.  If you had a scopolamine patch placed behind your ear for the management of post- operative nausea and/or vomiting:  1. The medication in the patch is effective for 72 hours, after which it should be removed.  Wrap patch in a tissue and discard in the trash. Wash hands thoroughly with soap and water. 2. You may remove the patch earlier than 72 hours if you experience unpleasant side effects which may include dry mouth, dizziness or visual disturbances. 3. Avoid touching the patch. Wash your hands with soap and water after contact with the patch.      Postoperative instructions:  Weightbearing instructions: as tolerated in postop shoe  Keep your dressing and/or splint clean and dry at all times.  You can remove your dressing on post-operative day #3 and change with a dry/sterile dressing or Band-Aids as needed thereafter.    Incision instructions:  Do not soak your incision for 3 weeks after surgery.  If the incision gets wet, pat dry and do not scrub the incision.  Pain control:  You have  been given a prescription to be taken as directed for post-operative pain control.  In addition, elevate the operative extremity above the heart at all times to prevent swelling and throbbing pain.  Take over-the-counter Colace, 100mg  by mouth twice a day while taking narcotic pain medications to help prevent constipation.  Follow up appointments: 1) 10-14 days for suture removal and wound check. 2) Dr. Roda Shutters as scheduled.   -------------------------------------------------------------------------------------------------------------  After Surgery Pain Control:  After your surgery, post-surgical discomfort or pain is likely. This discomfort can last several days to a few weeks. At certain times of the day your discomfort may be more intense.  Did you receive a nerve block?  A nerve block can provide pain relief for one hour to two days after your surgery. As long as the nerve block is working, you will experience little or no sensation in the area the surgeon operated on.  As the nerve block wears off, you will begin to experience pain or discomfort. It is very important that you begin taking your prescribed pain medication before the nerve block fully wears off. Treating your pain at the first sign of the block wearing off will ensure your pain is better controlled and more tolerable when full-sensation returns. Do not wait until the pain is intolerable, as the medicine will be less effective. It is better to treat pain in advance than to try and catch up.  General Anesthesia:  If you did not receive a nerve block during your surgery, you will need to start taking your pain medication shortly after your surgery and should  continue to do so as prescribed by your surgeon.  Pain Medication:  Most commonly we prescribe Vicodin and Percocet for post-operative pain. Both of these medications contain a combination of acetaminophen (Tylenol) and a narcotic to help control pain.   It takes between 30 and  45 minutes before pain medication starts to work. It is important to take your medication before your pain level gets too intense.   Nausea is a common side effect of many pain medications. You will want to eat something before taking your pain medicine to help prevent nausea.   If you are taking a prescription pain medication that contains acetaminophen, we recommend that you do not take additional over the counter acetaminophen (Tylenol).  Other pain relieving options:   Using a cold pack to ice the affected area a few times a day (15 to 20 minutes at a time) can help to relieve pain, reduce swelling and bruising.   Elevation of the affected area can also help to reduce pain and swelling.       Next pain pill may be taken at 10:00pm tonight

## 2017-11-07 NOTE — Op Note (Signed)
   Date of Surgery: 11/07/2017  INDICATIONS: [name] is a 52 y.o.-year-old female with a right great toe retained needl;  The patient did consent to the procedure after discussion of the risks and benefits.  PREOPERATIVE DIAGNOSIS: Retained needle right great toe  POSTOPERATIVE DIAGNOSIS: Same.  PROCEDURE: removal of foreign body from right great toe  SURGEON: N. Glee ArvinMichael Majorie Santee, M.D.  ASSIST: Starlyn SkeansMary Lindsey MosqueroStanbery, [place] JerseyPA-C; necessary for the timely completion of procedure and due to complexity of procedure.  ANESTHESIA:  general  IV FLUIDS AND URINE: See anesthesia.  ESTIMATED BLOOD LOSS: minimal mL.  IMPLANTS: none  DRAINS: none  COMPLICATIONS: None.  DESCRIPTION OF PROCEDURE: The patient was brought to the operating room and placed supine on the operating table.  The patient had been signed prior to the procedure and this was documented. The patient had the anesthesia placed by the anesthesiologist.  A time-out was performed to confirm that this was the correct patient, site, side and location. The patient did receive antibiotics prior to the incision and was re-dosed during the procedure as needed at indicated intervals.  A tourniquet was not placed.  The patient had the operative extremity prepped and draped in the standard surgical fashion.    The previous incision was opened with dissecting scissors.  Blunt dissection was carried through the subcutaneous tissue down onto the proximal phalanx.  Using fluoroscopy I was able to localize the needle and this was then removed gently.  Fluoroscopy was used to confirm complete removal.  The wound was then thoroughly irrigated.  I perform secondary closure of the previous surgical incision of the right great toe.  Sterile dressings were applied.  Patient tolerated procedure well had no immediate complications.  POSTOPERATIVE PLAN: Patient will be weight-bear as tolerated in a postop shoe.  Follow-up in 2 weeks for suture removal.  Mayra ReelN. Michael  Renia Mikelson, MD [facility] [phone] 8:41 PM

## 2017-11-08 ENCOUNTER — Encounter (HOSPITAL_BASED_OUTPATIENT_CLINIC_OR_DEPARTMENT_OTHER): Payer: Self-pay | Admitting: Orthopaedic Surgery

## 2017-11-09 NOTE — Telephone Encounter (Signed)
Pt is needing a new meter, she has lost her.  Please advise

## 2017-11-12 ENCOUNTER — Other Ambulatory Visit: Payer: Self-pay

## 2017-11-12 NOTE — Telephone Encounter (Signed)
Left vm requesting patient call back to let me know what kind of meter she uses its not in med list

## 2017-11-14 ENCOUNTER — Telehealth (INDEPENDENT_AMBULATORY_CARE_PROVIDER_SITE_OTHER): Payer: Self-pay | Admitting: Orthopaedic Surgery

## 2017-11-14 NOTE — Telephone Encounter (Signed)
Called patient to advise she should not get it wet until she comes to her post op visit.

## 2017-11-14 NOTE — Telephone Encounter (Signed)
Patient had surgery on 01/23 on her foot and she wants to know if when in the shower if she can fully submerge her foot since she still has stitches. Please advise # [phone].

## 2017-11-23 ENCOUNTER — Telehealth: Payer: Self-pay | Admitting: Gastroenterology

## 2017-11-26 ENCOUNTER — Telehealth (INDEPENDENT_AMBULATORY_CARE_PROVIDER_SITE_OTHER): Payer: Self-pay

## 2017-11-26 ENCOUNTER — Ambulatory Visit (INDEPENDENT_AMBULATORY_CARE_PROVIDER_SITE_OTHER): Payer: 59 | Admitting: Orthopaedic Surgery

## 2017-11-26 ENCOUNTER — Encounter (INDEPENDENT_AMBULATORY_CARE_PROVIDER_SITE_OTHER): Payer: Self-pay | Admitting: Orthopaedic Surgery

## 2017-11-26 DIAGNOSIS — S90454D Superficial foreign body, right lesser toe(s), subsequent encounter: Secondary | ICD-10-CM

## 2017-11-26 MED ORDER — COLCHICINE 0.6 MG PO TABS: ORAL_TABLET | ORAL | 0 refills | Status: AC

## 2017-11-26 MED ORDER — INDOMETHACIN 50 MG PO CAPS: ORAL_CAPSULE | ORAL | 0 refills | Status: AC

## 2017-11-26 NOTE — Telephone Encounter (Signed)
Patient had called triage phone earlier stating that her Rx that was prescribed today (Colchicine) needs a PA. She was very upset and at last hung up on christy. We did advise her that we will do a PA on Rx.   Did PA and we will get response in approx 72 hours via fax or online through [email or web address].   Called patient to let her know, no answer. LMOM with details.

## 2017-11-26 NOTE — Progress Notes (Signed)
Post-Op Visit Note   Patient: Monique Nelson           Date of Birth: [birth year]           MRN: [id number] Visit Date: 11/26/2017 PCP: Monique Medin, MD   Assessment & Plan:  Chief Complaint:  Chief Complaint  Patient presents with  . Right Great Toe - Pain, Routine Post Op, Follow-up   Visit Diagnoses:  1. Foreign body of toe of right foot, subsequent encounter      Plan: Impression is status post sewing needle removal right great toe with an acute gouty attack.  Monique Nelson comes in for follow-up.  She is 19 days status post right great toe excision sewing needle date of surgery 11/07/2017.  She is doing well up until yesterday morning.  She woke up with marked pain to the first MTP joint, right toe.  Little bit of swelling and pain weightbearing as well as trying to flex the right great toe.  No history of gout.  She has been taking tramadol and naproxen without relief of symptoms.  No peri-incisional pain, drainage, cellulitis or signs of infection.  Marked pain over the first MTP joint with associated swelling and mild erythema.  At this point, we are going to have her continue Weightbearing as tolerated and transition into a regular shoe.  I will call in prescriptions for this acute gouty attack.  She will follow-up with [place] on an as-needed basis.  Follow-Up Instructions: Return if symptoms worsen or fail to improve.   Orders:  No orders of the defined types were placed in this encounter.  No orders of the defined types were placed in this encounter.   Imaging: No new images today  PMFS History: Patient Active Problem List   Diagnosis Date Noted  . Foreign body of toe of right foot 11/06/2017  . Diabetes mellitus without complication ([place]) 70/62/3762  . Solar dermatitis ? 05/05/2014  . Allergic conjunctivitis and rhinitis 12/23/2013  . Hypokalemia 12/23/2013  . Prediabetes 12/23/2013  . Family history of sleep apnea 11/17/2013  . OSA (obstructive sleep apnea)  11/17/2013  . Snoring 11/17/2013  . Hot flushes, perimenopausal 11/17/2013  . Sleep difficulties 08/05/2013  . Bacterial vaginosis 08/05/2013  . Fasting hyperglycemia 01/10/2013  . Diarrhea 01/10/2013  . Abdominal pain, epigastric 01/10/2013  . Nausea with vomiting 01/10/2013  . FH: Crohn's disease 01/10/2013  . Headache(784.0) 09/10/2012  . Elevated blood pressure reading 09/10/2012  . Menopausal hot flushes 08/02/2012  . Visit for preventive health examination 08/02/2012  . Hx of hysterectomy   . Ex-smoker   . Substance abuse in remission ([place])   . Morbid obesity ([place]) 12/10/2007  . Allergic rhinitis 12/10/2007  . VERTIGO 12/10/2007   Past Medical History:  Diagnosis Date  . Diabetes mellitus without complication ([place])   . Drug addiction ([place])    recovering  . Ex-smoker 2009  . Foreign body (FB) in soft tissue    right great toe  . History of fracture of wrist 17 years ago   has plate and pins right   . Hx of hysterectomy    bleeding and cysts  . Hx of varicella   . Sleep apnea    uses CPAP nightly  . Stroke ([place])   . Substance abuse in remission ([place]) 13 years ago    cocaine heroiin  met   support via ADP  . TIA (transient ischemic attack)   . Vertigo    intermittent infrequent  Family History  Problem Relation Age of Onset  . COPD Mother   . Diabetes Mother   . Hypertension Mother   . Sleep apnea Mother        and sister   . Thyroid disease Mother   . Chronic fatigue Mother   . Osteoarthritis Mother        knee replacement   . Glaucoma Father   . Other Father        prostate disease  . Alcohol abuse Unknown        drug parent    Past Surgical History:  Procedure Laterality Date  . ABDOMINAL HYSTERECTOMY    . ABDOMINAL HYSTERECTOMY    . ablastion    . FOREIGN BODY REMOVAL Right 11/07/2017   Procedure: RIGHT GREAT TOE FOREIGN BODY REMOVAL;  Surgeon: Monique Koyanagi, MD;  Location: [facility];  Service: Orthopedics;  Laterality: Right;    . rt hand surgery     17 years ago  . WRIST FRACTURE SURGERY     Social History   Occupational History    Employer: [employer]  Tobacco Use  . Smoking status: Former Smoker    Packs/day: 0.25    Years: 8.00    Pack years: 2.00    Types: Cigarettes    Start date: 10/21/2013  . Smokeless tobacco: Never Used  . Tobacco comment: off and on x 8 yrs  Substance and Sexual Activity  . Alcohol use: No  . Drug use: Yes    Types: "Crack" cocaine, Heroin, Methamphetamines, Marijuana    Comment: History- 14 years clean  . Sexual activity: Not on file

## 2017-11-27 ENCOUNTER — Other Ambulatory Visit: Payer: Self-pay

## 2017-11-27 ENCOUNTER — Other Ambulatory Visit (INDEPENDENT_AMBULATORY_CARE_PROVIDER_SITE_OTHER): Payer: Self-pay | Admitting: Physician Assistant

## 2017-11-27 ENCOUNTER — Telehealth (INDEPENDENT_AMBULATORY_CARE_PROVIDER_SITE_OTHER): Payer: Self-pay

## 2017-11-27 DIAGNOSIS — Z1211 Encounter for screening for malignant neoplasm of colon: Secondary | ICD-10-CM

## 2017-11-27 MED ORDER — PREDNISONE 10 MG (21) PO TBPK: ORAL_TABLET | ORAL | 0 refills | Status: DC

## 2017-11-27 NOTE — Telephone Encounter (Signed)
Called patient no answer. Per Mardella LaymanLindsey since Pa for Colchicine tab got denied then she sent over different meds to her pharm. Mardella LaymanLindsey would like for patient to watch her Glucose and take extra blood sugar meds, if it rises to high to stop meds.

## 2017-11-27 NOTE — Telephone Encounter (Signed)
Procedure has been scheduled for 02/05/18 11:45  Left message for patient to call back

## 2017-11-28 ENCOUNTER — Other Ambulatory Visit: Payer: Self-pay | Admitting: Endocrinology

## 2017-11-28 NOTE — Telephone Encounter (Signed)
[facility] and [facility]  I helped her reschedule pre-visit to April closer to the procedure

## 2017-12-10 ENCOUNTER — Other Ambulatory Visit: Payer: Self-pay | Admitting: Internal Medicine

## 2017-12-10 DIAGNOSIS — Z1231 Encounter for screening mammogram for malignant neoplasm of breast: Secondary | ICD-10-CM

## 2017-12-11 ENCOUNTER — Other Ambulatory Visit: Payer: Self-pay | Admitting: Endocrinology

## 2017-12-11 DIAGNOSIS — E119 Type 2 diabetes mellitus without complications: Secondary | ICD-10-CM

## 2017-12-12 ENCOUNTER — Other Ambulatory Visit (INDEPENDENT_AMBULATORY_CARE_PROVIDER_SITE_OTHER): Payer: 59

## 2017-12-12 DIAGNOSIS — E119 Type 2 diabetes mellitus without complications: Secondary | ICD-10-CM

## 2017-12-12 LAB — HEMOGLOBIN A1C: Hgb A1c MFr Bld: 6.5 % (ref 4.6–6.5)

## 2017-12-12 LAB — GLUCOSE, RANDOM: GLUCOSE: 100 mg/dL — AB (ref 70–99)

## 2017-12-13 ENCOUNTER — Telehealth (INDEPENDENT_AMBULATORY_CARE_PROVIDER_SITE_OTHER): Payer: Self-pay | Admitting: Orthopaedic Surgery

## 2017-12-13 NOTE — Telephone Encounter (Signed)
See message.

## 2017-12-13 NOTE — Telephone Encounter (Signed)
Patient states she has been seen for her left knee before, she has bone spurs and arthritis but she said every time she moves during the night it wakes her up and is hurting all the time. Shes wondering what she needs to do next, I offered an appt but she would like a call back before she makes one. Please advise # [phone]

## 2017-12-13 NOTE — Telephone Encounter (Signed)
We can send in some nsaids or we can offer her a cortisone injection.

## 2017-12-14 ENCOUNTER — Ambulatory Visit (INDEPENDENT_AMBULATORY_CARE_PROVIDER_SITE_OTHER): Payer: 59 | Admitting: Endocrinology

## 2017-12-14 ENCOUNTER — Encounter: Payer: Self-pay | Admitting: Endocrinology

## 2017-12-14 VITALS — BP 130/78 | HR 80 | Ht 61.0 in | Wt 263.4 lb

## 2017-12-14 DIAGNOSIS — E782 Mixed hyperlipidemia: Secondary | ICD-10-CM | POA: Diagnosis not present

## 2017-12-14 DIAGNOSIS — E1165 Type 2 diabetes mellitus with hyperglycemia: Secondary | ICD-10-CM | POA: Diagnosis not present

## 2017-12-14 NOTE — Telephone Encounter (Signed)
Called patient and she would like some NSAIDS called into her pharm.    PHARMACY: [facility] [address]

## 2017-12-14 NOTE — Telephone Encounter (Signed)
Meloxicam 7.5 mg bid prn #30.  Voltaren gel also

## 2017-12-14 NOTE — Progress Notes (Signed)
Patient ID: Monique Nelson, female   DOB: [birth year], 52 y.o.   MRN: [id number]    Reason for Appointment : Followup for Type 2 Diabetes  History of Present Illness          Diagnosis: Type 2 diabetes mellitus, date of diagnosis: 2015       Past history: She has had impaired fasting glucose as per her record since about 2009 with the highest level being 110 Has not had any nutritional counseling or any specific management of this previously On her routine exam her A1c was found to be 6.6 in 11/2013 and random glucose was 112 She was given Victoza instead of Amaryl 2 mg to help with her weight and more consistent control  Recent history:    Her A1c is 6.5, previously had been 6.8-6.9  She has not been seen in follow-up for 11 months  Current blood sugar patterns and problems identified:  She appears to be having better blood sugar control than when she was seen last year  Although she was starting to get more candidiasis from [place] she is having this only infrequently and able to treated with OTC creams and occasional Diflucan  Blood sugars appear to be consistently controlled at different times of the day overall  However will have occasional random high blood sugars recently after breakfast  More recently has been trying to be more motivated to watch her carbohydrates and portions  Start going to the gym for exercise now  No side effects with this 1.8 Victoza which she is taking regularly   Non-insulin hypoglycemic drugs the patient is taking are: Invokana 100 mg daily, Victoza 1.8 mg daily  Side effects from medications have been: GI side effects from metformin  Glucose monitoring with One Touch ultra:  as above   Self-care: The diet that the patient has been following is: tries to limit fat intake; for breakfast is having egg whites or [place] sausage Usually having a salad or meat and vegetables at lunch, snacks will be fruit occasionally           Exercise:  just starting  Dietician visit: Most recent: 8/16.               Compliance with the medical regimen: fair    Weight history:  Wt Readings from Last 3 Encounters:  12/14/17 263 lb 6.4 oz (119.5 kg)  11/07/17 267 lb (121.1 kg)  10/25/17 274 lb 3.2 oz (124.4 kg)   Glycemic control:   Lab Results  Component Value Date   HGBA1C 6.5 12/12/2017   HGBA1C 6.9 (H) 06/05/2017   HGBA1C 6.8 (H) 11/21/2016   Lab Results  Component Value Date   MICROALBUR 2.7 (H) 06/05/2017   LDLCALC 93 06/05/2017   CREATININE 0.81 11/06/2017    Lab Results  Component Value Date   FRUCTOSAMINE 230 01/19/2017   FRUCTOSAMINE 201 06/23/2016   FRUCTOSAMINE 214 02/27/2014     Allergies as of 12/14/2017      Reactions   Other Other (See Comments)   ALL NARCOTICS-PATIENT PREFERENCE, PAST HISTORY      Medication List        Accurate as of 12/14/17  9:14 AM. Always use your most recent med list.          albuterol 108 (90 Base) MCG/ACT inhaler Commonly known as:  PROVENTIL HFA;VENTOLIN HFA Inhale 2 puffs into the lungs every 6 (six) hours as needed for wheezing or shortness of breath.  canagliflozin 100 MG Tabs tablet Commonly known as:  INVOKANA Take 1 tablet (100 mg total) by mouth daily before breakfast.   cetirizine 10 MG tablet Commonly known as:  ZYRTEC Take 10 mg by mouth daily as needed for allergies.   colchicine 0.6 MG tablet Take two pills po day 1, then take one pill bid until symptoms resolve. Do not take more than 3 days   dimenhyDRINATE 50 MG tablet Commonly known as:  DRAMAMINE Take 50 mg by mouth every 8 (eight) hours as needed for nausea.   EYE DROPS ALLERGY RELIEF OP Apply 2 drops to eye daily.   gabapentin 100 MG capsule Commonly known as:  NEURONTIN Take 1 capsule (100 mg total) by mouth 2 (two) times daily.   indomethacin 50 MG capsule Commonly known as:  INDOCIN Take one tab po TID x 5 days   Lorcaserin HCl ER 20 MG Tb24 Commonly known as:   BELVIQ XR Take 20 mg by mouth daily with breakfast.   naproxen 375 MG tablet Commonly known as:  NAPROSYN Take 1 tablet (375 mg total) by mouth 2 (two) times daily.   ONETOUCH VERIO test strip Generic drug:  glucose blood USE AS DIRECTED TO CHECK BLOOD SUGAR ONCE DAILY, ALTERNATING MEALS   rosuvastatin 20 MG tablet Commonly known as:  CRESTOR Take 1 tablet (20 mg total) by mouth daily.   sodium chloride 0.65 % Soln nasal spray Commonly known as:  OCEAN Place 1 spray into both nostrils as needed for congestion.   VICTOZA 18 MG/3ML Sopn Generic drug:  liraglutide INJECT 1.'8MG'$  INTO THE SKIN ONCE DAILY       Allergies:  Allergies  Allergen Reactions  . Other Other (See Comments)    ALL NARCOTICS-PATIENT PREFERENCE, PAST HISTORY    Past Medical History:  Diagnosis Date  . Diabetes mellitus without complication ([place])   . Drug addiction ([place])    recovering  . Ex-smoker 2009  . Foreign body (FB) in soft tissue    right great toe  . History of fracture of wrist 17 years ago   has plate and pins right   . Hx of hysterectomy    bleeding and cysts  . Hx of varicella   . Sleep apnea    uses CPAP nightly  . Stroke ([place])   . Substance abuse in remission ([place]) 13 years ago    cocaine heroiin  met   support via ADP  . TIA (transient ischemic attack)   . Vertigo    intermittent infrequent    Past Surgical History:  Procedure Laterality Date  . ABDOMINAL HYSTERECTOMY    . ABDOMINAL HYSTERECTOMY    . ablastion    . FOREIGN BODY REMOVAL Right 11/07/2017   Procedure: RIGHT GREAT TOE FOREIGN BODY REMOVAL;  Surgeon: Leandrew Koyanagi, MD;  Location: [facility];  Service: Orthopedics;  Laterality: Right;  . rt hand surgery     17 years ago  . WRIST FRACTURE SURGERY      Family History  Problem Relation Age of Onset  . COPD Mother   . Diabetes Mother   . Hypertension Mother   . Sleep apnea Mother        and sister   . Thyroid disease Mother   . Chronic  fatigue Mother   . Osteoarthritis Mother        knee replacement   . Glaucoma Father   . Other Father        prostate disease  .  Alcohol abuse Unknown        drug parent    Social History:  reports that she has quit smoking. Her smoking use included cigarettes. She started smoking about 4 years ago. She has a 2.00 pack-year smoking history. she has never used smokeless tobacco. She reports that she uses drugs. Drugs: "Crack" cocaine, Heroin, Methamphetamines, and Marijuana. She reports that she does not drink alcohol.    Review of Systems       Lipids: As follows,  On Crestor 20 mg that was started in 01/2017 instead of Lipitor when her LDL was still high No side effects and she is taking this regularly He still is relatively lower however    Lab Results  Component Value Date   CHOL 159 06/05/2017   HDL 35.10 (L) 06/05/2017   LDLCALC 93 06/05/2017   TRIG 153.0 (H) 06/05/2017   CHOLHDL 5 06/05/2017                No history of hypertension   BP Readings from Last 3 Encounters:  12/14/17 130/78  11/07/17 (!) 149/74  11/05/17 107/69    Gabapentin Is not being used currently, previously prescribed for tingling in the legs   Physical Examination:  BP 130/78   Pulse 80   Ht '5\' 1"'$  (1.549 m)   Wt 263 lb 6.4 oz (119.5 kg)   LMP 12/15/2011   SpO2 96%   BMI 49.77 kg/m    ASSESSMENT/PLAN:   Diabetes type 2, with morbid obesity    See history of present illness for detailed discussion of current diabetes management, blood sugar patterns and problems identified  She is now continuing to benefit from taking Invokana along with her Victoza She is tolerating this well also with less frequent yeast infections Has lost some weight since last year even though she has not exercised regularly  Blood sugars are fairly consistent throughout the day with only mild increase higher carbohydrate breakfast She will continue to monitor blood sugar either fasting or after meals and  be consistent with exercise  LIPIDS: LDL is better controlled with Crestor compared to Lipitor and she will continue   Elayne Snare 12/14/2017, 9:14 AM

## 2017-12-17 ENCOUNTER — Other Ambulatory Visit: Payer: Self-pay | Admitting: Endocrinology

## 2017-12-17 ENCOUNTER — Other Ambulatory Visit (INDEPENDENT_AMBULATORY_CARE_PROVIDER_SITE_OTHER): Payer: Self-pay | Admitting: Physician Assistant

## 2017-12-17 MED ORDER — PREDNISONE 10 MG (21) PO TBPK: ORAL_TABLET | ORAL | 0 refills | Status: DC

## 2017-12-17 NOTE — Telephone Encounter (Signed)
Patient call very upset and yelling at me on the phone, I advised her there was no need to yell. I understood her frustration and I was only trying to help her out.   Patient states she cannot afford Voltaren gel.   Per Mardella LaymanLindsey  She will send in Rx for Patient. She should not take any other anti inflammatory with steroid pack.

## 2017-12-25 ENCOUNTER — Encounter: Payer: 59 | Admitting: Internal Medicine

## 2017-12-28 ENCOUNTER — Ambulatory Visit: Payer: 59

## 2018-01-02 ENCOUNTER — Encounter: Payer: Self-pay | Admitting: *Deleted

## 2018-01-07 ENCOUNTER — Telehealth: Payer: Self-pay | Admitting: Gastroenterology

## 2018-01-07 NOTE — Telephone Encounter (Signed)
Patient is ok to leave her procedure where it is .  Her weight is"still fluctuating".

## 2018-01-21 ENCOUNTER — Telehealth: Payer: Self-pay

## 2018-01-21 ENCOUNTER — Ambulatory Visit: Payer: 59

## 2018-01-21 NOTE — Telephone Encounter (Signed)
Called pts home number and voicemail is full. Pt was a no show in pre-visit today. Will follow up tomorrow.

## 2018-01-25 ENCOUNTER — Ambulatory Visit (AMBULATORY_SURGERY_CENTER): Payer: Self-pay | Admitting: *Deleted

## 2018-01-25 ENCOUNTER — Other Ambulatory Visit: Payer: Self-pay

## 2018-01-25 VITALS — Ht 61.5 in | Wt 262.0 lb

## 2018-01-25 DIAGNOSIS — Z1211 Encounter for screening for malignant neoplasm of colon: Secondary | ICD-10-CM

## 2018-01-25 MED ORDER — NA SULFATE-K SULFATE-MG SULF 17.5-3.13-1.6 GM/177ML PO SOLN: 1.0000 | Freq: Once | ORAL | 0 refills | Status: AC

## 2018-01-25 NOTE — Progress Notes (Signed)
No egg or soy allergy known to patient  No issues with past sedation with any surgeries  or procedures, no intubation problems  No diet pills per patient currently- pt has script for Belviq No home 02 use per patient  No blood thinners per patient  Pt denies issues with constipation  No A fib or A flutter  EMMI video sent to pt's e mail - pt declined  [facility]- gave pt $15 coupon for suprep today in [place]

## 2018-01-28 ENCOUNTER — Encounter (HOSPITAL_COMMUNITY): Payer: Self-pay | Admitting: *Deleted

## 2018-01-28 ENCOUNTER — Other Ambulatory Visit: Payer: Self-pay

## 2018-02-04 ENCOUNTER — Telehealth: Payer: Self-pay

## 2018-02-04 NOTE — Telephone Encounter (Signed)
Patient's work will not allow her off.  She will call back when work calms down to reschedule.

## 2018-02-05 ENCOUNTER — Ambulatory Visit (HOSPITAL_COMMUNITY): Admission: RE | Admit: 2018-02-05 | Payer: 59 | Source: Ambulatory Visit | Admitting: Gastroenterology

## 2018-02-05 SURGERY — COLONOSCOPY WITH PROPOFOL
Anesthesia: Monitor Anesthesia Care

## 2018-03-08 ENCOUNTER — Encounter: Payer: Self-pay | Admitting: Internal Medicine

## 2018-04-24 ENCOUNTER — Ambulatory Visit: Payer: 59 | Admitting: Endocrinology

## 2018-04-29 ENCOUNTER — Other Ambulatory Visit: Payer: 59

## 2018-05-02 ENCOUNTER — Ambulatory Visit: Payer: 59 | Admitting: Endocrinology

## 2018-05-02 DIAGNOSIS — Z0289 Encounter for other administrative examinations: Secondary | ICD-10-CM

## 2018-08-19 ENCOUNTER — Encounter: Payer: 59 | Admitting: Internal Medicine

## 2019-08-15 ENCOUNTER — Other Ambulatory Visit: Payer: Self-pay | Admitting: Internal Medicine
